# Patient Record
Sex: Female | Born: 1955 | Race: Black or African American | Hispanic: No | Marital: Married | Smoking: Never smoker
Health system: Southern US, Community
[De-identification: ages and names within clinical notes are randomized; demographics above are authoritative.]

## PROBLEM LIST (undated history)

## (undated) DIAGNOSIS — I422 Other hypertrophic cardiomyopathy: Secondary | ICD-10-CM

## (undated) DIAGNOSIS — I10 Essential (primary) hypertension: Secondary | ICD-10-CM

## (undated) DIAGNOSIS — K219 Gastro-esophageal reflux disease without esophagitis: Secondary | ICD-10-CM

## (undated) HISTORY — DX: Gastro-esophageal reflux disease without esophagitis: K21.9

## (undated) HISTORY — DX: Essential (primary) hypertension: I10

## (undated) HISTORY — PX: ABDOMINAL HYSTERECTOMY: SHX81

## (undated) HISTORY — DX: Other hypertrophic cardiomyopathy: I42.2

---

## 2002-09-18 ENCOUNTER — Emergency Department (HOSPITAL_COMMUNITY): Admission: EM | Admit: 2002-09-18 | Discharge: 2002-09-18 | Payer: Self-pay | Admitting: *Deleted

## 2010-05-21 ENCOUNTER — Inpatient Hospital Stay (HOSPITAL_COMMUNITY): Admission: EM | Admit: 2010-05-21 | Discharge: 2010-05-24 | Payer: Self-pay | Source: Home / Self Care

## 2010-05-22 ENCOUNTER — Encounter (INDEPENDENT_AMBULATORY_CARE_PROVIDER_SITE_OTHER): Payer: Self-pay | Admitting: Internal Medicine

## 2010-08-29 LAB — CBC
HCT: 37.1 % (ref 36.0–46.0)
Hemoglobin: 13 g/dL (ref 12.0–15.0)
MCH: 29.7 pg (ref 26.0–34.0)
MCH: 30 pg (ref 26.0–34.0)
MCHC: 34.8 g/dL (ref 30.0–36.0)
MCHC: 35.4 g/dL (ref 30.0–36.0)
MCV: 85.7 fL (ref 78.0–100.0)
Platelets: 122 10*3/uL — ABNORMAL LOW (ref 150–400)
Platelets: 123 10*3/uL — ABNORMAL LOW (ref 150–400)
RBC: 4.26 MIL/uL (ref 3.87–5.11)
RBC: 4.37 MIL/uL (ref 3.87–5.11)
RBC: 4.67 MIL/uL (ref 3.87–5.11)
RDW: 13.1 % (ref 11.5–15.5)
RDW: 13.5 % (ref 11.5–15.5)
WBC: 4.1 10*3/uL (ref 4.0–10.5)
WBC: 4.7 10*3/uL (ref 4.0–10.5)

## 2010-08-29 LAB — HEPATITIS PANEL, ACUTE
Hep A IgM: NEGATIVE
Hepatitis B Surface Ag: NEGATIVE

## 2010-08-29 LAB — CK TOTAL AND CKMB (NOT AT ARMC)
CK, MB: 3.2 ng/mL (ref 0.3–4.0)
Total CK: 179 U/L — ABNORMAL HIGH (ref 7–177)

## 2010-08-29 LAB — BASIC METABOLIC PANEL
BUN: 15 mg/dL (ref 6–23)
CO2: 27 mEq/L (ref 19–32)
CO2: 29 mEq/L (ref 19–32)
CO2: 29 mEq/L (ref 19–32)
Calcium: 9.4 mg/dL (ref 8.4–10.5)
Calcium: 9.4 mg/dL (ref 8.4–10.5)
Calcium: 9.9 mg/dL (ref 8.4–10.5)
Chloride: 104 mEq/L (ref 96–112)
Creatinine, Ser: 1.16 mg/dL (ref 0.4–1.2)
Creatinine, Ser: 1.2 mg/dL (ref 0.4–1.2)
Creatinine, Ser: 1.46 mg/dL — ABNORMAL HIGH (ref 0.4–1.2)
GFR calc Af Amer: 45 mL/min — ABNORMAL LOW (ref >60)
GFR calc Af Amer: 56 mL/min — ABNORMAL LOW (ref >60)
GFR calc non Af Amer: 46 mL/min — ABNORMAL LOW (ref >60)
GFR calc non Af Amer: 48 mL/min — ABNORMAL LOW (ref >60)
Glucose, Bld: 95 mg/dL (ref 70–99)
Glucose, Bld: 99 mg/dL (ref 70–99)
Potassium: 3.6 mEq/L (ref 3.5–5.1)
Sodium: 139 mEq/L (ref 135–145)
Sodium: 139 mEq/L (ref 135–145)

## 2010-08-29 LAB — COMPREHENSIVE METABOLIC PANEL
ALT: 548 U/L — ABNORMAL HIGH (ref 0–35)
Albumin: 3.7 g/dL (ref 3.5–5.2)
BUN: 13 mg/dL (ref 6–23)
Calcium: 9.6 mg/dL (ref 8.4–10.5)
GFR calc Af Amer: 43 mL/min — ABNORMAL LOW (ref >60)
Glucose, Bld: 104 mg/dL — ABNORMAL HIGH (ref 70–99)
Sodium: 140 mEq/L (ref 135–145)
Total Bilirubin: 1.8 mg/dL — ABNORMAL HIGH (ref 0.3–1.2)
Total Protein: 7 g/dL (ref 6.0–8.3)

## 2010-08-29 LAB — POCT CARDIAC MARKERS: CKMB, poc: 1.7 ng/mL (ref 1.0–8.0)

## 2010-08-29 LAB — URINE MICROSCOPIC-ADD ON

## 2010-08-29 LAB — URINALYSIS, ROUTINE W REFLEX MICROSCOPIC
Glucose, UA: NEGATIVE mg/dL
Urobilinogen, UA: 0.2 mg/dL (ref 0.0–1.0)
pH: 7.5 (ref 5.0–8.0)

## 2010-08-29 LAB — HEPATIC FUNCTION PANEL
ALT: 358 U/L — ABNORMAL HIGH (ref 0–35)
AST: 137 U/L — ABNORMAL HIGH (ref 0–37)
Albumin: 3.7 g/dL (ref 3.5–5.2)
Alkaline Phosphatase: 100 U/L (ref 39–117)
Indirect Bilirubin: 0.7 mg/dL (ref 0.3–0.9)
Total Protein: 7.1 g/dL (ref 6.0–8.3)

## 2010-08-29 LAB — RAPID URINE DRUG SCREEN, HOSP PERFORMED
Amphetamines: NOT DETECTED
Barbiturates: NOT DETECTED
Benzodiazepines: NOT DETECTED
Cocaine: NOT DETECTED
Opiates: NOT DETECTED
Tetrahydrocannabinol: NOT DETECTED

## 2010-08-29 LAB — CARDIAC PANEL(CRET KIN+CKTOT+MB+TROPI)
CK, MB: 2.7 ng/mL (ref 0.3–4.0)
Total CK: 137 U/L (ref 7–177)
Troponin I: 0.02 ng/mL (ref 0.00–0.06)
Troponin I: 0.04 ng/mL (ref 0.00–0.06)

## 2010-08-29 LAB — DIFFERENTIAL
Basophils Absolute: 0 10*3/uL (ref 0.0–0.1)
Basophils Absolute: 0 10*3/uL (ref 0.0–0.1)
Basophils Relative: 0 % (ref 0–1)
Eosinophils Absolute: 0.1 10*3/uL (ref 0.0–0.7)
Eosinophils Relative: 3 % (ref 0–5)
Lymphocytes Relative: 32 % (ref 12–46)
Lymphs Abs: 1.1 10*3/uL (ref 0.7–4.0)
Lymphs Abs: 1.5 10*3/uL (ref 0.7–4.0)
Monocytes Relative: 12 % (ref 3–12)
Neutro Abs: 2.6 10*3/uL (ref 1.7–7.7)
Neutrophils Relative %: 58 % (ref 43–77)
Neutrophils Relative %: 59 % (ref 43–77)

## 2010-08-29 LAB — LIPID PANEL
Cholesterol: 141 mg/dL (ref 0–200)
LDL Cholesterol: 88 mg/dL (ref 0–99)

## 2010-08-29 LAB — URINE CULTURE: Colony Count: NO GROWTH

## 2011-09-05 ENCOUNTER — Encounter: Payer: Self-pay | Admitting: Cardiology

## 2011-09-11 ENCOUNTER — Encounter: Payer: Self-pay | Admitting: Cardiology

## 2011-09-12 ENCOUNTER — Ambulatory Visit (INDEPENDENT_AMBULATORY_CARE_PROVIDER_SITE_OTHER): Payer: Self-pay | Admitting: Physician Assistant

## 2011-09-12 ENCOUNTER — Encounter: Payer: Self-pay | Admitting: Cardiology

## 2011-09-12 DIAGNOSIS — R079 Chest pain, unspecified: Secondary | ICD-10-CM | POA: Insufficient documentation

## 2011-09-12 DIAGNOSIS — I10 Essential (primary) hypertension: Secondary | ICD-10-CM

## 2011-09-12 DIAGNOSIS — I422 Other hypertrophic cardiomyopathy: Secondary | ICD-10-CM

## 2011-09-12 MED ORDER — AMLODIPINE BESYLATE 5 MG PO TABS: 5.0000 mg | ORAL_TABLET | Freq: Every day | ORAL | Status: DC

## 2011-09-12 MED ORDER — LISINOPRIL 20 MG PO TABS: 20.0000 mg | ORAL_TABLET | Freq: Every day | ORAL | Status: DC

## 2011-09-12 NOTE — Progress Notes (Signed)
HPI:  This is a 56 year old African American female patient who is sent to [place] [facility] for evaluation of chest pain and uncontrolled hypertension. The patient has a history of a hyper-tensive emergency back in 2011 at which time she was admitted to the hospital. 2-D echo at that time showed disproportionate septal hypertrophy, no left ventricular outflow tract gradient, only oral SAM. 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  The patient has not seen a cardiologist in followup. She complains of two-month history of sharp shooting chest pains that occur once a week for the past 2 months. She says they go away within 3 seconds but she takes a baby aspirin for it. She denies any chest tightness, dyspnea, dyspnea on exertion, radiation of pain, dizziness, or presyncope. 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 She was just started on lisinopril.  She walks 45 minutes a day without difficulty. She is not a diabetic, has normal lipids, and does not smoke.  Allergies  Allergen Reactions  . Codeine     Current Outpatient Prescriptions on File Prior to Visit  Medication Sig Dispense Refill  . lisinopril (PRINIVIL,ZESTRIL) 10 MG tablet Take 10 mg by mouth daily.      . metoprolol (LOPRESSOR) 50 MG tablet Take 50 mg by mouth 2 (two) times daily.         Past Medical History  Diagnosis Date  . Essential hypertension, benign   . Hypertrophic non-obstructive cardiomyopathy     Echocardiogram 2011    No past surgical history on file.  Family History  Problem Relation Age of Onset  . Diabetes type II Mother   . Diabetes type II Sister   . Hypertension Mother   . Hypertension Sister     History   Social History  . Marital Status: Single    Spouse Name: N/A    Number of Children: N/A  . Years of Education: N/A   Occupational History  . Not on file.   Social History Main Topics  . Smoking status: Never Smoker   .  Smokeless tobacco: Never Used  . Alcohol Use: No  . Drug Use: No  . Sexually Active: Not on file   Other Topics Concern  . Not on file   Social History Narrative  . No narrative on file    ROS:see history of present illness otherwise negative   PHYSICAL EXAM: Well-nournished, in no acute distress. Neck: No JVD, HJR, Bruit, or thyroid enlargement Lungs: No tachypnea, clear without wheezing, rales, or rhonchi Cardiovascular: RRR, PMI not displaced, 2-3/6 systolic murmur at the left sternal border, no gallops, bruit, thrill, or heave. Abdomen: BS normal. Soft without organomegaly, masses, lesions or tenderness. Extremities: without cyanosis, clubbing or edema. Good distal pulses bilateral SKin: Warm, no lesions or rashes  Musculoskeletal: No deformities Neuro: no focal signs  BP 220/93  Pulse 62  Resp 16  Ht 5\' 7"  (1.702 m)  Wt 242 lb (109.77 kg)  BMI 37.90 kg/m2   NWG:NFAOZHY from 09/05/11 shows normal sinus rhythm first degree AV block and LVH with nonspecific ST-T wave changes

## 2011-09-12 NOTE — Progress Notes (Signed)
Patient seen and examined. Reviewed available records and discussed with [name]. [name] has a history of a nonobstructive hypertrophic cardiomyopathy as well as difficult to control hypertension. Last echocardiogram was in 2011. From a symptom perspective she reports atypical, sharp, brief episodes of chest pain, not specifically exertional. She has not been on any consistent medical regimen for blood pressure control, now followed [facility] the [facility]. ECG shows significant LVH with repolarization abnormalities.  On examination blood pressure is elevated at 220/93, heart rate in the 60s. She has a 2/6 systolic murmur heard at the base, no major change with maneuvers, no gallop. Lungs are clear and there is no peripheral edema.  Our plan at this point is to followup with a repeat 2-D echocardiogram. Medical therapy will be modified to include an increase in lisinopril and the addition of Norvasc. We will bring her back for followup.  Jonelle Sidle, M.D., F.A.C.C.

## 2011-09-12 NOTE — Patient Instructions (Addendum)
Your physician recommends that you schedule a follow-up appointment in: 1 month  Your physician has requested that you have an echocardiogram. Echocardiography is a painless test that uses sound waves to create images of your heart. It provides your doctor with information about the size and shape of your heart and how well your heart's chambers and valves are working. This procedure takes approximately one hour. There are no restrictions for this procedure.  Your physician has recommended you make the following change in your medication:  1 - START Amlodipine 5 mg daily 2 - INCREASE Lisinopril to 20 mg daily  Low Salt Diet (Information included)

## 2011-09-12 NOTE — Assessment & Plan Note (Addendum)
Patient has significant hypertension. We will increase her lisinopril to 20 mg daily, and Norvasc 5 mg daily, 2 g sodium diet. Followup here in one month.Regular followup at the [facility] for her hypertension

## 2011-09-12 NOTE — Assessment & Plan Note (Signed)
Patient has history of nonobstructive hypertrophic cardiomyopathy on echo in 2011. She has not been evaluated for this since. We will followup 2-D echo.

## 2011-09-12 NOTE — Assessment & Plan Note (Signed)
Patient has atypical chest pain described as sharp shooting it lasts less than 3 seconds.

## 2011-09-21 ENCOUNTER — Ambulatory Visit (HOSPITAL_COMMUNITY)
Admission: RE | Admit: 2011-09-21 | Discharge: 2011-09-21 | Disposition: A | Discharge status: Home / Self Care | Payer: Self-pay | Source: Ambulatory Visit | Attending: Cardiology | Admitting: Cardiology

## 2011-09-21 DIAGNOSIS — I422 Other hypertrophic cardiomyopathy: Secondary | ICD-10-CM

## 2011-09-21 DIAGNOSIS — I359 Nonrheumatic aortic valve disorder, unspecified: Secondary | ICD-10-CM

## 2011-09-21 DIAGNOSIS — R079 Chest pain, unspecified: Secondary | ICD-10-CM | POA: Insufficient documentation

## 2011-09-21 DIAGNOSIS — I10 Essential (primary) hypertension: Secondary | ICD-10-CM | POA: Insufficient documentation

## 2011-09-21 NOTE — Progress Notes (Signed)
*  PRELIMINARY RESULTS* Echocardiogram 2D Echocardiogram has been performed.  Traci Chandler 09/21/2011, 12:53 PM

## 2011-10-12 ENCOUNTER — Ambulatory Visit (INDEPENDENT_AMBULATORY_CARE_PROVIDER_SITE_OTHER): Payer: Self-pay | Admitting: Cardiology

## 2011-10-12 ENCOUNTER — Encounter: Payer: Self-pay | Admitting: Cardiology

## 2011-10-12 VITALS — BP 186/88 | HR 61 | Resp 16 | Ht 67.0 in | Wt 237.0 lb

## 2011-10-12 DIAGNOSIS — I422 Other hypertrophic cardiomyopathy: Secondary | ICD-10-CM

## 2011-10-12 DIAGNOSIS — I10 Essential (primary) hypertension: Secondary | ICD-10-CM

## 2011-10-12 MED ORDER — HYDRALAZINE HCL 25 MG PO TABS: 25.0000 mg | ORAL_TABLET | Freq: Three times a day (TID) | ORAL | Status: DC

## 2011-10-12 NOTE — Progress Notes (Signed)
   Clinical Summary Traci Chandler is a 56 y.o.female presenting for followup. She was seen in March with Ms. Geni Bers.  Followup echocardiogram showed LVEF 60-65% with severe LVH but no outflow gradient. grade 2 diastolic dysfunction. Her medications were adjusted at the last visit.  She did not tolerate Norvasc, complained of bloated feeling. Lopressor was subsequently increased. She has been doing some walking for exercise. No progressive dyspnea. No syncope.  Blood pressure better but remains high.   Allergies  Allergen Reactions  . Codeine     Current Outpatient Prescriptions  Medication Sig Dispense Refill  . aspirin 81 MG tablet Take 81 mg by mouth daily.      Marland Kitchen lisinopril (PRINIVIL,ZESTRIL) 20 MG tablet Take 1 tablet (20 mg total) by mouth daily.  30 tablet  12  . metoprolol (LOPRESSOR) 100 MG tablet Take 100 mg by mouth 2 (two) times daily.      Marland Kitchen DISCONTD: metoprolol (LOPRESSOR) 50 MG tablet Take 50 mg by mouth 2 (two) times daily.         Past Medical History  Diagnosis Date  . Essential hypertension, benign   . Hypertrophic non-obstructive cardiomyopathy     Echocardiogram 2011    Social History [name] reports that she has never smoked. She has never used smokeless tobacco. Traci Chandler reports that she does not drink alcohol.  Review of Systems No palpitations or syncope. Increased pigmentation lower legs. Otherwise negative.  Physical Examination Filed Vitals:   10/12/11 1335  BP: 186/88  Pulse: 61  Resp: 16   Overweight woman in NAD. HEENT: Conjunctiva and lids normal, oropharynx clear. Neck: Supple, no elevated JVP or carotid bruits, no thyromegaly. Lungs: Clear to auscultation, nonlabored breathing at rest. Cardiac: Regular rate and rhythm, no S3, 2/6 systolic murmur at base - does not change significantly with standing, no pericardial rub.. Abdomen: Soft, nontender, bowel sounds present, no guarding or rebound. Extremities: No pitting edema,  distal pulses 2+. Venous stasis and hyperpigmentation. Skin: Warm and dry. Musculoskeletal: No kyphosis. Neuropsychiatric: Alert and oriented x3, affect grossly appropriate.     Problem List and Plan

## 2011-10-12 NOTE — Assessment & Plan Note (Signed)
Nonobstructive by recent echocardiogram. Agree with increase in beta blocker. Should be OK to exercise, mild to moderate - mainly walking or elliptical machine. Followup arranged.

## 2011-10-12 NOTE — Patient Instructions (Signed)
**Note De-Identified Joelynn Dust Obfuscation** Your physician has recommended you make the following change in your medication: start taking Hydralazine 25 mg three times daily  Your physician recommends that you schedule a follow-up appointment in: 1 month

## 2011-10-12 NOTE — Assessment & Plan Note (Signed)
Continue present regimen and add Hydralazine 25 mg TID.

## 2011-10-17 ENCOUNTER — Other Ambulatory Visit (HOSPITAL_COMMUNITY): Payer: Self-pay | Admitting: Physician Assistant

## 2011-10-17 DIAGNOSIS — Z1231 Encounter for screening mammogram for malignant neoplasm of breast: Secondary | ICD-10-CM

## 2011-10-23 ENCOUNTER — Ambulatory Visit (HOSPITAL_COMMUNITY)
Admission: RE | Admit: 2011-10-23 | Discharge: 2011-10-23 | Disposition: A | Discharge status: Home / Self Care | Payer: Self-pay | Source: Ambulatory Visit | Attending: Physician Assistant | Admitting: Physician Assistant

## 2011-10-23 DIAGNOSIS — Z1231 Encounter for screening mammogram for malignant neoplasm of breast: Secondary | ICD-10-CM

## 2011-11-11 IMAGING — CR DG CHEST 1V PORT
1 series · 1 of 1 positions shown · non-contrast
Comparison: None

CLINICAL DATA: Chest pain and hypertension.

PORTABLE CHEST - 1 VIEW

[view not recorded]
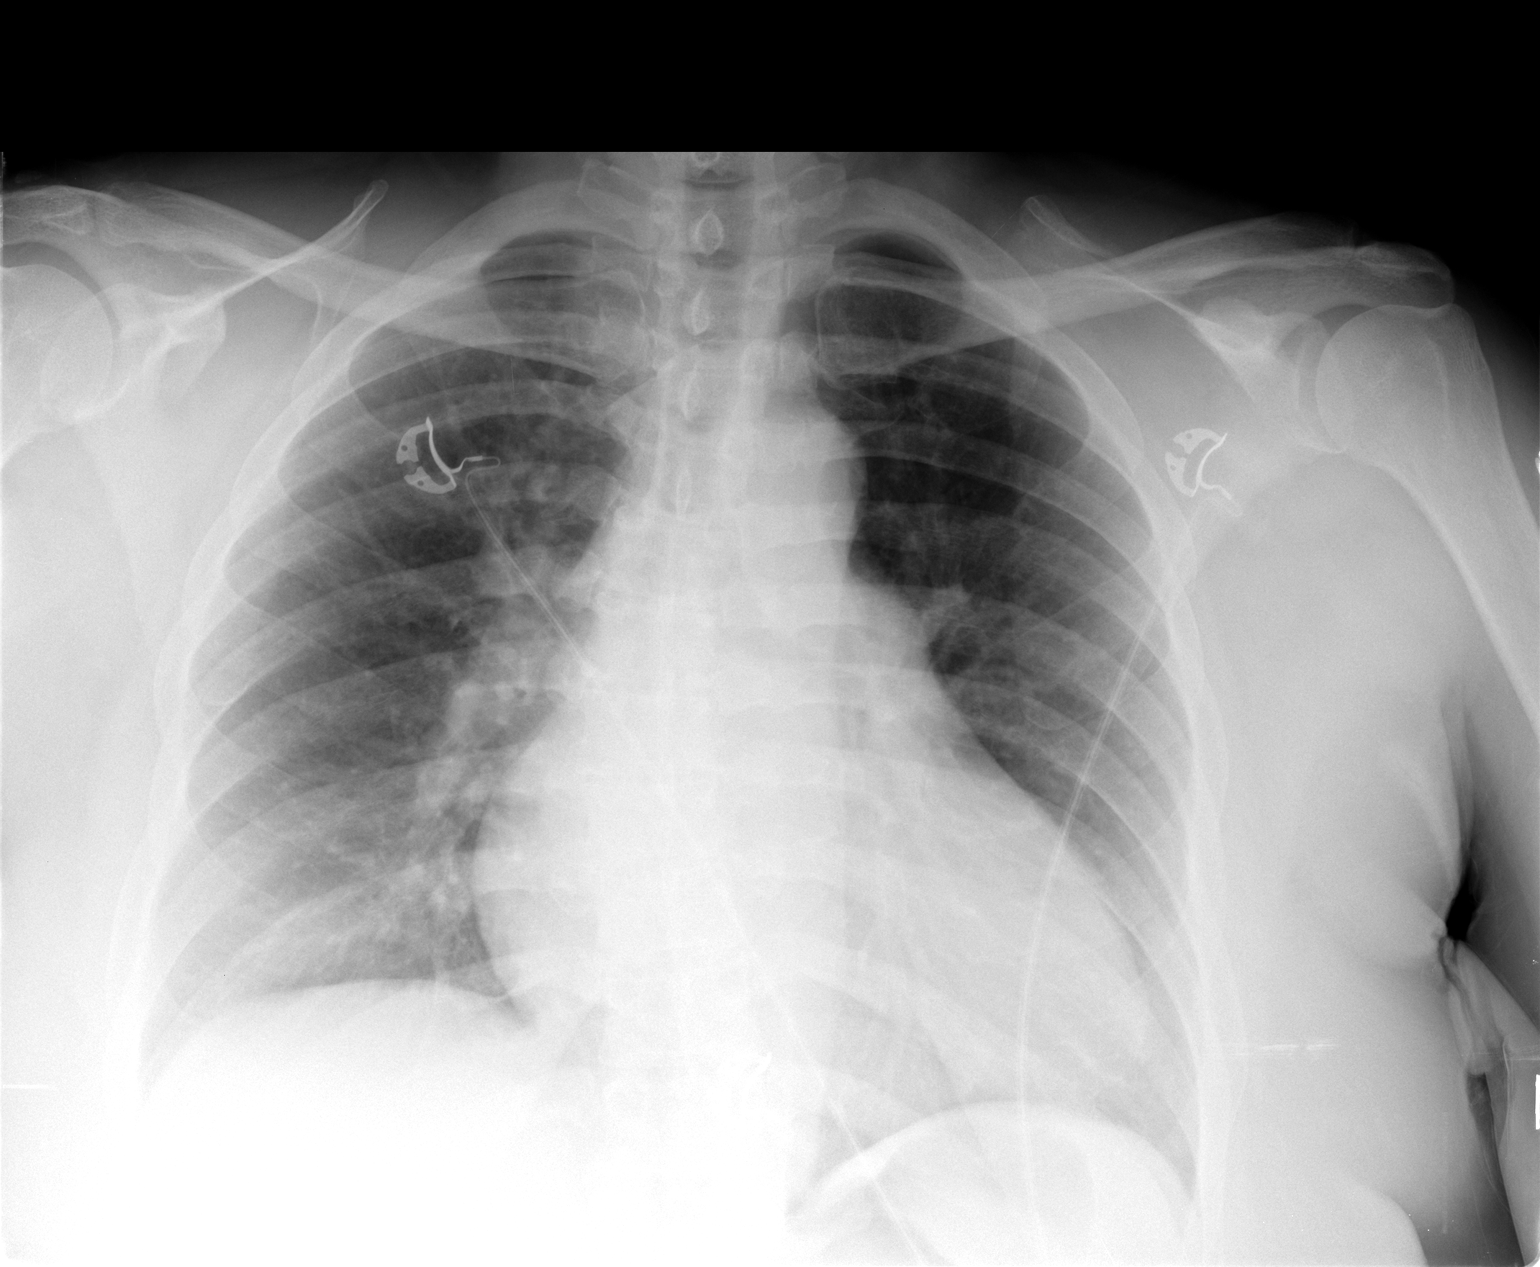

[1 of 1 positions shown; findings below may reference images not displayed]

FINDINGS: The heart is enlarged.  The mediastinal and hilar
contours are within normal limits.  The lungs are clear.  The bony
thorax is intact.
IMPRESSION: 1.  Mild cardiac enlargement.
2.  No acute pulmonary findings.

## 2011-11-13 ENCOUNTER — Ambulatory Visit (INDEPENDENT_AMBULATORY_CARE_PROVIDER_SITE_OTHER): Payer: Self-pay | Admitting: Cardiology

## 2011-11-13 ENCOUNTER — Encounter: Payer: Self-pay | Admitting: Cardiology

## 2011-11-13 VITALS — BP 222/102 | HR 58 | Ht 67.0 in | Wt 236.0 lb

## 2011-11-13 DIAGNOSIS — I422 Other hypertrophic cardiomyopathy: Secondary | ICD-10-CM

## 2011-11-13 DIAGNOSIS — I10 Essential (primary) hypertension: Secondary | ICD-10-CM

## 2011-11-13 IMAGING — US US ABDOMEN COMPLETE
1 series · 13 of 25 positions shown · non-contrast
Comparison: None

***ADDENDUM*** CREATED: 05/24/2010 [DATE]

Repeat imaging of the gallbladder performed in a with the patient
n.p.o.  Normal gallbladder is not identified.  At expected
position of the gallbladder fossa, a shadowing structures
identified highly suspicious for a contracted gallbladder
containing multiple shadowing calculi, creating a "wall echo shadow
complex."  No definite free fluid identified right upper quadrant.
Patient was not tender with transducer pressure.
CLINICAL DATA: Abdominal pain.
COMPLETE ABDOMINAL ULTRASOUND

[Series 1: us abdomen complete · 0.26mm/px · 13 of 58 slices shown]
[im 1/58]
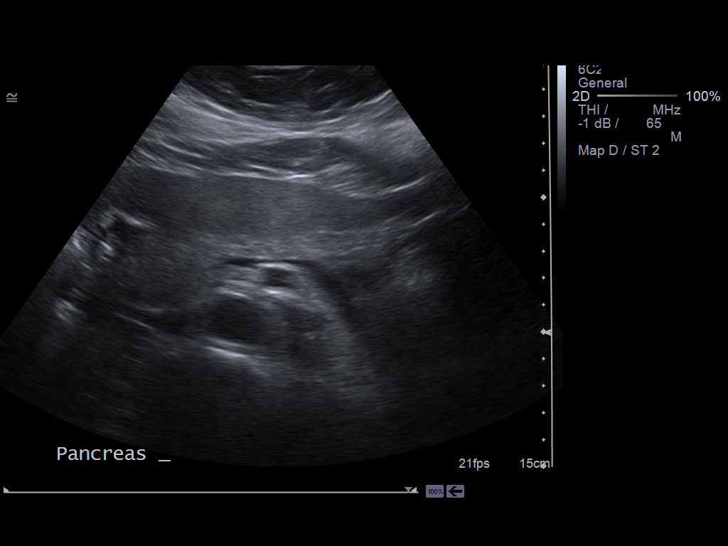
[im 5/58]
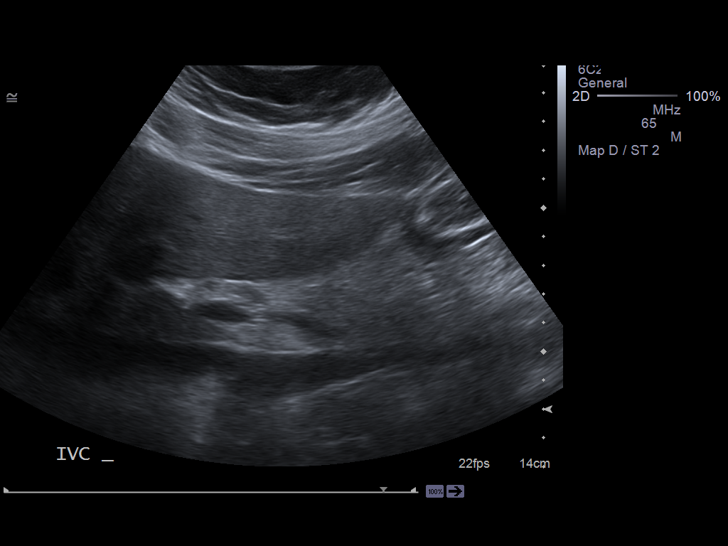
[im 10/58]
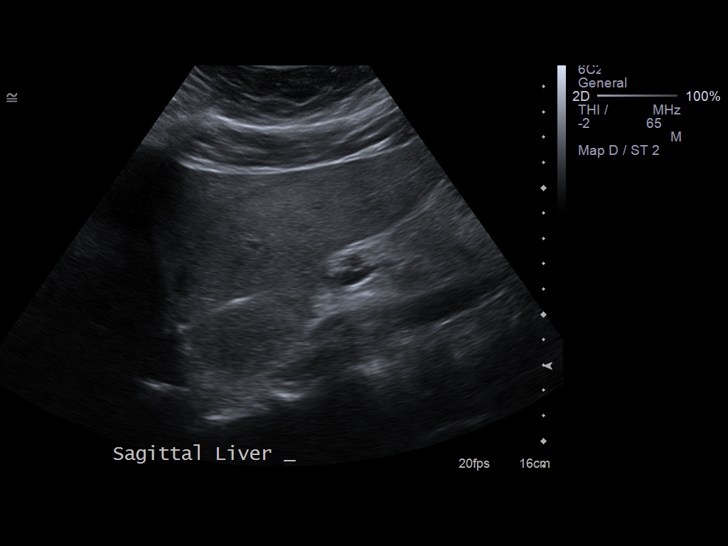
[im 15/58]
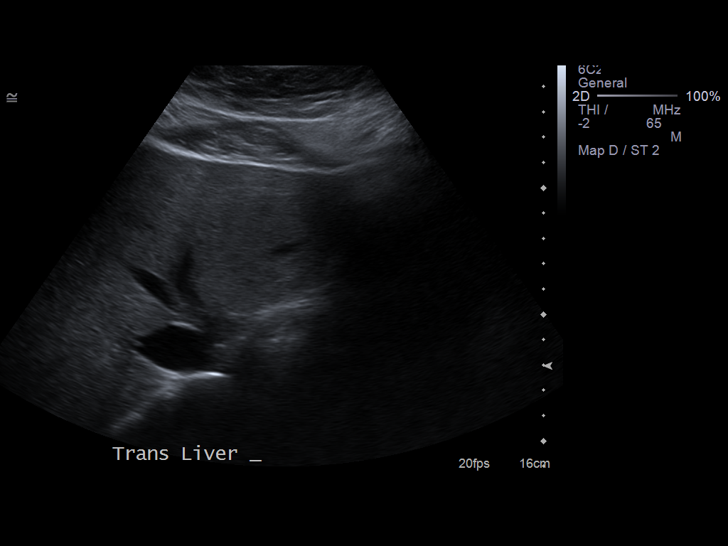
[im 20/58]
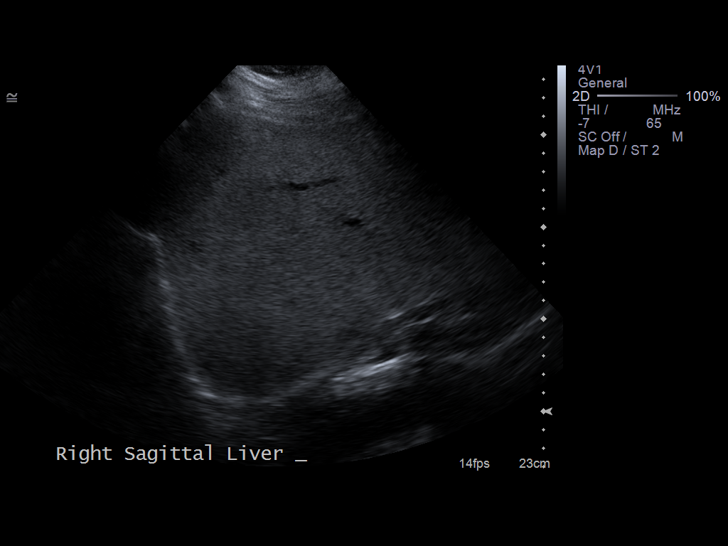
[im 24/58]
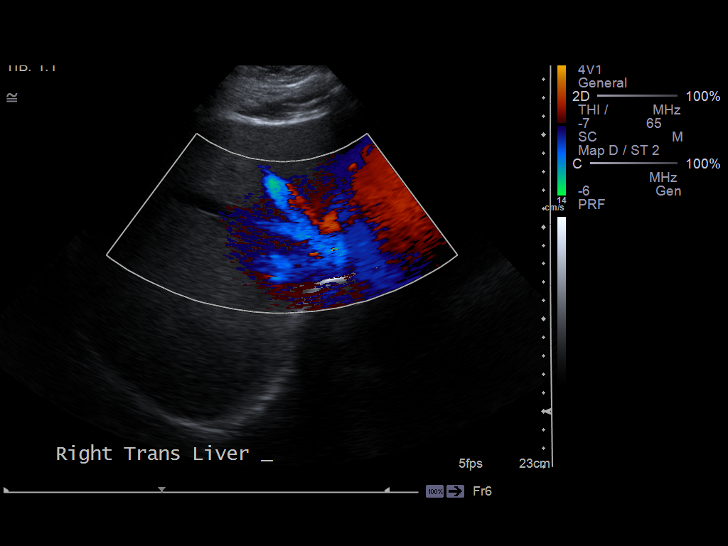
[im 29/58]
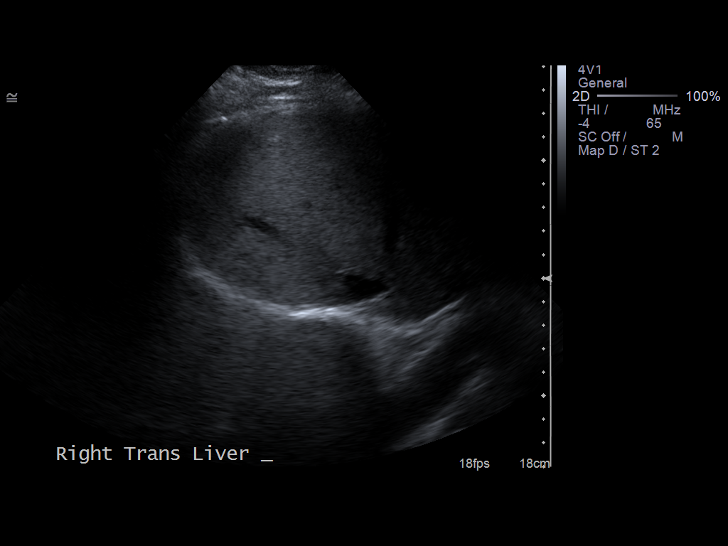
[im 34/58]
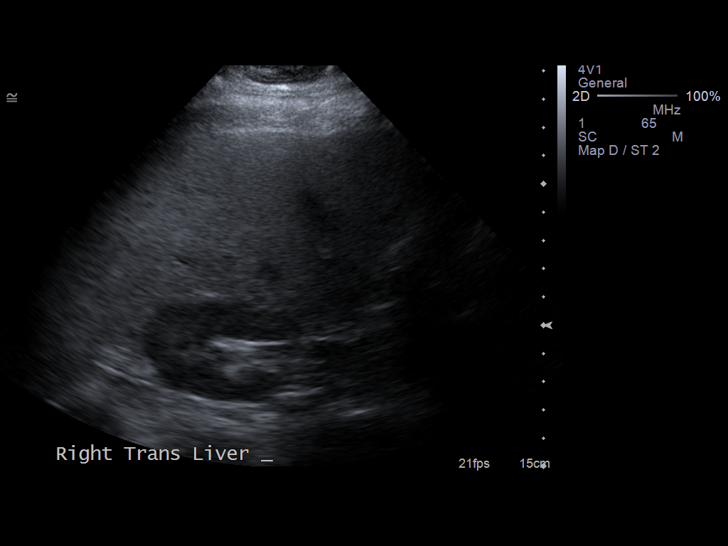
[im 39/58]
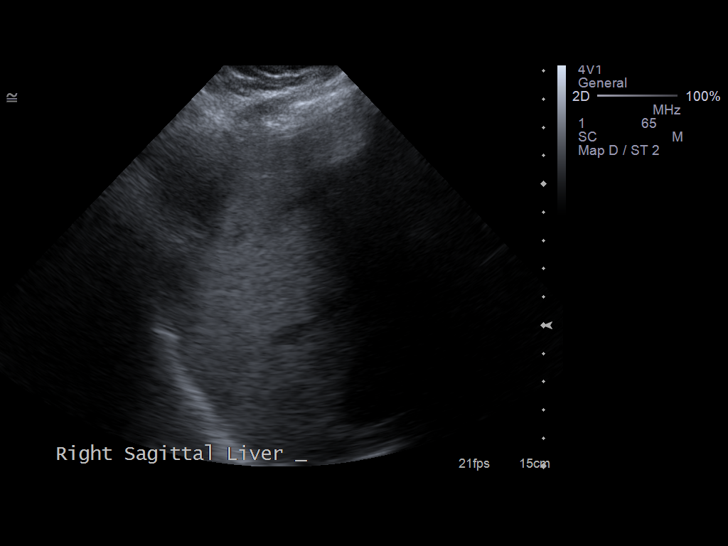
[im 43/58]
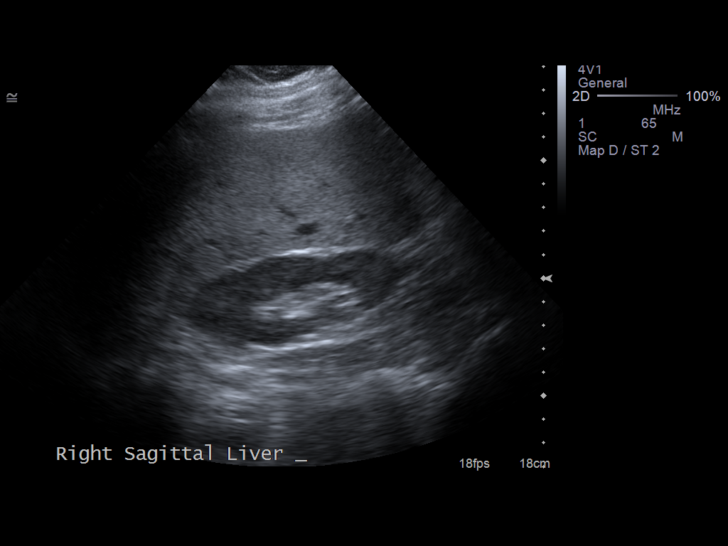
[im 48/58]
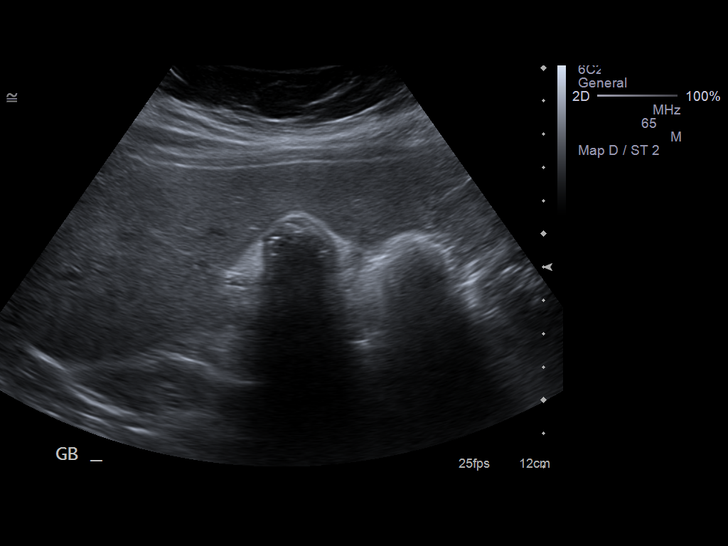
[im 53/58]
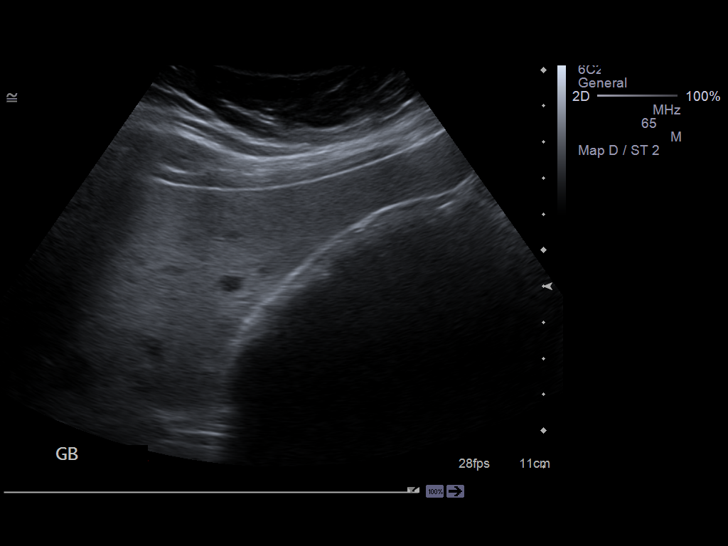
[im 58/58]
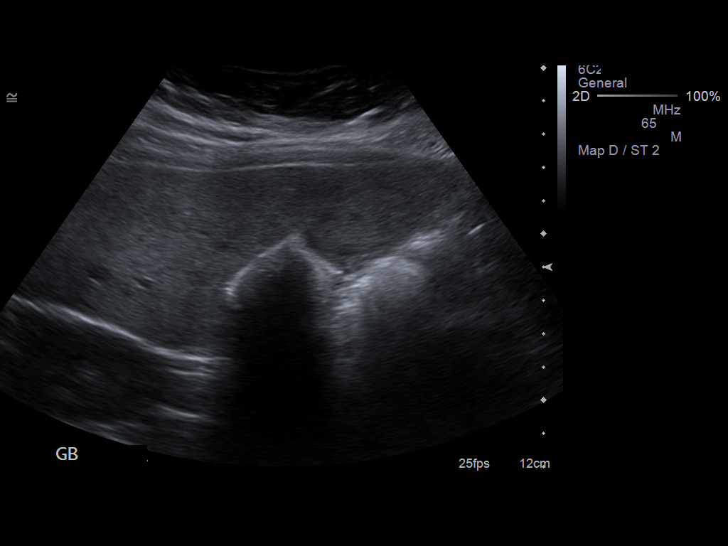

[13 of 25 positions shown; findings below may reference images not displayed]

FINDINGS: Gallbladder:  The gallbladder is not visualized for certain.
However, the patient was not n.p.o.  There appears to be a wall
echo shadow complex in the region of the gallbladder fossa
suspicious for a contracted stone filled gallbladder.  A repeat
examination in the morning is suggested.

Common bile duct:  Slightly dilated at 6.8 mm.

Liver:  Mild increased echogenicity may suggest fatty infiltration.
No focal lesions or biliary dilatation.

IVC:  Normal caliber.

Pancreas:  Sonographically unremarkable.

Spleen:  Normal size and echogenicity without focal lesions.

Right Kidney:  12.2 cm in length. Normal renal cortical thickness
and echogenicity without focal lesions or hydronephrosis.

Left Kidney:  11.5 cm in length. Normal renal cortical thickness
and echogenicity without focal lesions or hydronephrosis.

Abdominal aorta:  Normal caliber.
IMPRESSION: 1.  Probable contracted stone filled gallbladder.  Recommend repeat
examination in the morning as the patient was not n.p.o.
2.  Slightly dilated common bile duct.
3.  Normal sonographic appearance of the liver, spleen, pancreas
and both kidneys.

## 2011-11-13 MED ORDER — LISINOPRIL 40 MG PO TABS: 40.0000 mg | ORAL_TABLET | Freq: Every day | ORAL | Status: DC

## 2011-11-13 MED ORDER — HYDRALAZINE HCL 50 MG PO TABS: 50.0000 mg | ORAL_TABLET | Freq: Three times a day (TID) | ORAL | Status: DC

## 2011-11-13 NOTE — Assessment & Plan Note (Signed)
Blood pressure has increased once again. Continue current dose of atenolol that was recently started on 5/17. Hydralazine will be advanced to 50 mg 3 times a day, and lisinopril advanced to 40 mg daily. Followup BMET in 2 weeks. Office visit arranged.

## 2011-11-13 NOTE — Patient Instructions (Signed)
Your physician recommends that you schedule a follow-up appointment in: 1 month  Your physician has recommended you make the following change in your medication:  1 - INCREASE Hydralazine to 50 mg three times daily 2 - INCREASE Lisinopril to 40 mg daily  Your physician recommends that you return for lab work in: 2 week

## 2011-11-13 NOTE — Progress Notes (Signed)
   Clinical Summary Traci Chandler is a 56 y.o.female presenting for followup. She was seen in April. At that time blood pressure trend was better although not optimal. She was being treated with lisinopril and Lopressor. It seems that her Lopressor was stopped secondary to reported nightmares, and she is now on atenolol. She is also on hydralazine which was added at the last visit.  She states that her nightmares have resolved. She reports no chest pain, indicates blood pressures have once again been elevated by her home checks. She otherwise reports compliance with her remaining medications.   Allergies  Allergen Reactions  . Codeine     Current Outpatient Prescriptions  Medication Sig Dispense Refill  . aspirin 81 MG tablet Take 81 mg by mouth daily.      Marland Kitchen atenolol (TENORMIN) 100 MG tablet Take 100 mg by mouth daily.      . hydrALAZINE (APRESOLINE) 50 MG tablet Take 1 tablet (50 mg total) by mouth 3 (three) times daily.  90 tablet  12  . lisinopril (PRINIVIL,ZESTRIL) 40 MG tablet Take 1 tablet (40 mg total) by mouth daily.  30 tablet  12  . DISCONTD: hydrALAZINE (APRESOLINE) 25 MG tablet Take 1 tablet (25 mg total) by mouth 3 (three) times daily.  90 tablet  3  . DISCONTD: lisinopril (PRINIVIL,ZESTRIL) 20 MG tablet Take 1 tablet (20 mg total) by mouth daily.  30 tablet  12    Past Medical History  Diagnosis Date  . Essential hypertension, benign   . Hypertrophic non-obstructive cardiomyopathy     Echocardiogram 2011    Social History Traci Chandler reports that she has never smoked. She has never used smokeless tobacco. Traci Chandler reports that she does not drink alcohol.  Review of Systems No palpitations or syncope. No headaches or focal motor weakness. Otherwise negative.  Physical Examination Filed Vitals:   11/13/11 1421  BP: 222/102  Pulse: 58   Overweight woman in NAD.  HEENT: Conjunctiva and lids normal, oropharynx clear.  Neck: Supple, no elevated JVP or carotid  bruits, no thyromegaly.  Lungs: Clear to auscultation, nonlabored breathing at rest.  Cardiac: Regular rate and rhythm, no S3, 2/6 systolic murmur at base - does not change significantly with standing, no pericardial rub..  Abdomen: Soft, nontender, bowel sounds present, no guarding or rebound.  Extremities: No pitting edema, distal pulses 2+. Venous stasis and hyperpigmentation.    Problem List and Plan   Essential hypertension, benign Blood pressure has increased once again. Continue current dose of atenolol that was recently started on 5/17. Hydralazine will be advanced to 50 mg 3 times a day, and lisinopril advanced to 40 mg daily. Followup BMET in 2 weeks. Office visit arranged.  Hypertrophic cardiomyopathy Nonobstructive physiology.     Jonelle Sidle, M.D., F.A.C.C.

## 2011-11-13 NOTE — Assessment & Plan Note (Signed)
Nonobstructive physiology.

## 2011-12-19 ENCOUNTER — Ambulatory Visit (INDEPENDENT_AMBULATORY_CARE_PROVIDER_SITE_OTHER): Payer: Self-pay | Admitting: Cardiology

## 2011-12-19 ENCOUNTER — Encounter: Payer: Self-pay | Admitting: Cardiology

## 2011-12-19 VITALS — BP 170/78 | HR 63 | Ht 67.0 in | Wt 237.0 lb

## 2011-12-19 DIAGNOSIS — I422 Other hypertrophic cardiomyopathy: Secondary | ICD-10-CM

## 2011-12-19 DIAGNOSIS — R079 Chest pain, unspecified: Secondary | ICD-10-CM

## 2011-12-19 DIAGNOSIS — I10 Essential (primary) hypertension: Secondary | ICD-10-CM

## 2011-12-19 NOTE — Assessment & Plan Note (Signed)
Not well controlled, affected by noncompliance recently. Plan to continue current regimen, nurse blood pressure check in 2 weeks. Check a BMET at that time as well. We can determine if further titration is necessary.

## 2011-12-19 NOTE — Patient Instructions (Addendum)
**Note De-Identified Delma Drone Obfuscation** Your physician recommends that you continue on your current medications as directed. Please refer to the Current Medication list given to you today.  Your physician recommends that you return for lab work in: 2 weeks (same day as you Blood pressure check)  Your physician recommends that you schedule a follow-up appointment in: blood pressure check in 2 weeks and with provider in 1 month

## 2011-12-19 NOTE — Assessment & Plan Note (Signed)
Nonobstructive, no syncope.

## 2011-12-19 NOTE — Progress Notes (Signed)
   Clinical Summary [name] is a 56 y.o.female presenting for followup. She was seen in May. Medications were adjusted at that time. She has not taken them consistently, and did not follow up for a BMET. She states that she ran out of them, has not been taking anything since this past Friday.  Symptomatically, she reports no angina or progressive shortness of breath. She has had no palpitations or syncope.  Today we reinforced regular medication use, sodium restriction.   Allergies  Allergen Reactions  . Codeine     Current Outpatient Prescriptions  Medication Sig Dispense Refill  . aspirin 81 MG tablet Take 81 mg by mouth daily.      Marland Kitchen atenolol (TENORMIN) 100 MG tablet Take 100 mg by mouth daily.      . hydrALAZINE (APRESOLINE) 50 MG tablet Take 1 tablet (50 mg total) by mouth 3 (three) times daily.  90 tablet  12  . lisinopril (PRINIVIL,ZESTRIL) 40 MG tablet Take 1 tablet (40 mg total) by mouth daily.  30 tablet  12    Past Medical History  Diagnosis Date  . Essential hypertension, benign   . Hypertrophic non-obstructive cardiomyopathy     Echocardiogram 2011    Social History Traci Chandler reports that she has never smoked. She has never used smokeless tobacco. [name] reports that she does not drink alcohol.  Review of Systems Otherwise negative.  Physical Examination Filed Vitals:   12/19/11 1304  BP: 170/78  Pulse: 63   Overweight woman in NAD.  HEENT: Conjunctiva and lids normal, oropharynx clear.  Neck: Supple, no elevated JVP or carotid bruits, no thyromegaly.  Lungs: Clear to auscultation, nonlabored breathing at rest.  Cardiac: Regular rate and rhythm, no S3, 2/6 systolic murmur at base - does not change significantly with standing, no pericardial rub..  Abdomen: Soft, nontender, bowel sounds present, no guarding or rebound.  Extremities: No pitting edema, distal pulses 2+. Venous stasis and hyperpigmentation.     Problem List and Plan    Essential hypertension, benign Not well controlled, affected by noncompliance recently. Plan to continue current regimen, nurse blood pressure check in 2 weeks. Check a BMET at that time as well. We can determine if further titration is necessary.  Hypertrophic cardiomyopathy Nonobstructive, no syncope.    Jonelle Sidle, M.D., F.A.C.C.

## 2012-01-03 ENCOUNTER — Encounter: Payer: Self-pay | Admitting: *Deleted

## 2012-01-03 ENCOUNTER — Telehealth: Payer: Self-pay | Admitting: *Deleted

## 2012-01-03 ENCOUNTER — Ambulatory Visit (INDEPENDENT_AMBULATORY_CARE_PROVIDER_SITE_OTHER): Payer: Self-pay | Admitting: *Deleted

## 2012-01-03 VITALS — BP 198/85 | HR 63

## 2012-01-03 DIAGNOSIS — I10 Essential (primary) hypertension: Secondary | ICD-10-CM

## 2012-01-03 NOTE — Telephone Encounter (Signed)
Attempted to call both numbers listed, which are non working numbers.  Will send patient a certified letter.

## 2012-01-03 NOTE — Progress Notes (Signed)
Presents for blood pressure, as requested.  Is hypertensive at this visit, despite the fact that she states she is following her medication regimen.  No complaints noted.

## 2012-01-03 NOTE — Progress Notes (Signed)
Attempted to call both numbers, provided in patient's record.  Both are non working numbers.  Will send patient a certified letter.

## 2012-01-03 NOTE — Progress Notes (Signed)
Still did not see BMET results - please check on this. If she has truly been taking her medicines regularly, suggest increase hydralazine to 75 mg 3 times a day.  Jonelle Sidle, M.D., F.A.C.C.

## 2012-01-07 ENCOUNTER — Telehealth: Payer: Self-pay | Admitting: *Deleted

## 2012-01-07 NOTE — Telephone Encounter (Signed)
Certified letter receipt received [phone]

## 2012-01-11 ENCOUNTER — Other Ambulatory Visit: Payer: Self-pay | Admitting: Cardiology

## 2012-01-11 MED ORDER — ATENOLOL 100 MG PO TABS: 100.0000 mg | ORAL_TABLET | Freq: Every day | ORAL | Status: DC

## 2012-01-11 NOTE — Telephone Encounter (Signed)
Atenolol was prescribed by [facility].  They are closed today and she needs a refill.  Patient is asking if we can call it in for her because she only has 1 pill left. / tg

## 2012-01-24 ENCOUNTER — Ambulatory Visit: Payer: Self-pay | Admitting: Cardiology

## 2012-02-15 ENCOUNTER — Encounter: Payer: Self-pay | Admitting: *Deleted

## 2012-02-15 ENCOUNTER — Encounter: Payer: Self-pay | Admitting: Cardiology

## 2012-02-15 ENCOUNTER — Ambulatory Visit (INDEPENDENT_AMBULATORY_CARE_PROVIDER_SITE_OTHER): Payer: Self-pay | Admitting: Cardiology

## 2012-02-15 VITALS — BP 190/90 | HR 64 | Ht 67.0 in | Wt 237.0 lb

## 2012-02-15 DIAGNOSIS — I10 Essential (primary) hypertension: Secondary | ICD-10-CM

## 2012-02-15 MED ORDER — HYDRALAZINE HCL 100 MG PO TABS: 100.0000 mg | ORAL_TABLET | Freq: Three times a day (TID) | ORAL | Status: DC

## 2012-02-15 NOTE — Assessment & Plan Note (Signed)
Will try to see if she will take Hydralazine at 100 mg BID to TID. Need BMET. Could also add Aldactone, but would like to make sure that renal function is normal first. Continue other medications. Followup arranged.

## 2012-02-15 NOTE — Patient Instructions (Addendum)
Your physician recommends that you schedule a follow-up appointment in: 1 month  Your physician recommends that you return for lab work in: Please take script for lab work to [facility] prior to your appointment.  It is imperative that we check your kidney function.  Your physician has recommended you make the following change in your medication:  1 - INCREASE Hydralazine to 100 mg 3 times a day

## 2012-02-15 NOTE — Progress Notes (Signed)
   Clinical Summary [name] is a 56 y.o.female presenting for followup. She was seen in July. Blood pressure remains elevated. She reports no new symptoms, no chest pain or progressive dyspnea.  She states that she typically takes her Hydralazine twice a day, and other medications as directed.  We discussed options for treatment, need for BMET, and also compliance.   Allergies  Allergen Reactions  . Codeine     Current Outpatient Prescriptions  Medication Sig Dispense Refill  . aspirin 81 MG tablet Take 81 mg by mouth daily.      Marland Kitchen atenolol (TENORMIN) 100 MG tablet Take 1 tablet (100 mg total) by mouth daily.  30 tablet  6  . hydrALAZINE (APRESOLINE) 100 MG tablet Take 1 tablet (100 mg total) by mouth 3 (three) times daily.  90 tablet  11  . lisinopril (PRINIVIL,ZESTRIL) 40 MG tablet Take 1 tablet (40 mg total) by mouth daily.  30 tablet  12  . DISCONTD: hydrALAZINE (APRESOLINE) 50 MG tablet Take 1 tablet (50 mg total) by mouth 3 (three) times daily.  90 tablet  12    Past Medical History  Diagnosis Date  . Essential hypertension, benign   . Hypertrophic non-obstructive cardiomyopathy     Echocardiogram 2011    Social History Traci Chandler reports that she has never smoked. She has never used smokeless tobacco. [name] reports that she does not drink alcohol.  Review of Systems Negative except as outlined.  Physical Examination Filed Vitals:   02/15/12 1521  BP: 190/90  Pulse: 64    Overweight woman in NAD.  HEENT: Conjunctiva and lids normal, oropharynx clear.  Neck: Supple, no elevated JVP or carotid bruits, no thyromegaly.  Lungs: Clear to auscultation, nonlabored breathing at rest.  Cardiac: Regular rate and rhythm, no S3, 2/6 systolic murmur at base - does not change significantly with standing, no pericardial rub..  Abdomen: Soft, nontender, bowel sounds present, no guarding or rebound.  Extremities: No pitting edema, distal pulses 2+. Venous stasis  and hyperpigmentation.    Problem List and Plan   Essential hypertension, benign Will try to see if she will take Hydralazine at 100 mg BID to TID. Need BMET. Could also add Aldactone, but would like to make sure that renal function is normal first. Continue other medications. Followup arranged.    Jonelle Sidle, M.D., F.A.C.C.

## 2012-03-18 ENCOUNTER — Ambulatory Visit: Payer: Self-pay | Admitting: Cardiology

## 2012-03-21 ENCOUNTER — Ambulatory Visit (INDEPENDENT_AMBULATORY_CARE_PROVIDER_SITE_OTHER): Payer: Self-pay | Admitting: Cardiology

## 2012-03-21 ENCOUNTER — Encounter: Payer: Self-pay | Admitting: Cardiology

## 2012-03-21 ENCOUNTER — Encounter: Payer: Self-pay | Admitting: *Deleted

## 2012-03-21 VITALS — BP 210/110 | HR 78 | Wt 235.0 lb

## 2012-03-21 DIAGNOSIS — I10 Essential (primary) hypertension: Secondary | ICD-10-CM

## 2012-03-21 DIAGNOSIS — I422 Other hypertrophic cardiomyopathy: Secondary | ICD-10-CM

## 2012-03-21 MED ORDER — HYDRALAZINE HCL 25 MG PO TABS: 100.0000 mg | ORAL_TABLET | Freq: Three times a day (TID) | ORAL | Status: DC

## 2012-03-21 MED ORDER — SPIRONOLACTONE 25 MG PO TABS: 25.0000 mg | ORAL_TABLET | Freq: Every day | ORAL | Status: DC

## 2012-03-21 NOTE — Assessment & Plan Note (Signed)
We will add Aldactone to her current regimen. Also discussed exercise, sodium restriction, weight loss. Followup 6 weeks with BMET.

## 2012-03-21 NOTE — Assessment & Plan Note (Signed)
Clinically stable at this point. 

## 2012-03-21 NOTE — Patient Instructions (Addendum)
Your physician recommends that you schedule a follow-up appointment in: 6 weeks  Your physician has recommended you make the following change in your medication:  1 - START Aldactone (Spironolactone) 25 mg daily  Your physician recommends that you return for lab work in: Prior to your return appt

## 2012-03-21 NOTE — Progress Notes (Signed)
   Clinical Summary Traci Chandler is a 56 y.o.female presenting for followup. She was seen in August. She states that she is taking her medications, needs refills. Blood pressure is higher than usual today.   She did followup for labwork in late July. Sodium 140, potassium 4.1, BUN 22, creatinine 1.2, microalbumin 5.9. We reviewed the results and implications.  She has had no chest pain, palpitations, progressive dyspnea.   Allergies  Allergen Reactions  . Codeine     Current Outpatient Prescriptions  Medication Sig Dispense Refill  . aspirin 81 MG tablet Take 81 mg by mouth daily.      Marland Kitchen atenolol (TENORMIN) 100 MG tablet Take 1 tablet (100 mg total) by mouth daily.  30 tablet  6  . lisinopril (PRINIVIL,ZESTRIL) 40 MG tablet Take 1 tablet (40 mg total) by mouth daily.  30 tablet  12  . hydrALAZINE (APRESOLINE) 25 MG tablet Take 4 tablets (100 mg total) by mouth 3 (three) times daily.  270 tablet  3  . spironolactone (ALDACTONE) 25 MG tablet Take 1 tablet (25 mg total) by mouth daily.  90 tablet  3    Past Medical History  Diagnosis Date  . Essential hypertension, benign   . Hypertrophic non-obstructive cardiomyopathy     Echocardiogram 2011    Social History Traci Chandler reports that she has never smoked. She has never used smokeless tobacco. Traci Chandler reports that she does not drink alcohol.  Review of Systems Negative except as outlined.  Physical Examination Filed Vitals:   03/21/12 0838  BP: 210/110  Pulse: 78   Filed Weights   03/21/12 0838  Weight: 235 lb (106.595 kg)    HEENT: Conjunctiva and lids normal, oropharynx clear.  Neck: Supple, no elevated JVP or carotid bruits, no thyromegaly.  Lungs: Clear to auscultation, nonlabored breathing at rest.  Cardiac: Regular rate and rhythm, no S3, 2/6 systolic murmur at base - does not change significantly with standing, no pericardial rub..  Abdomen: Soft, nontender, bowel sounds present, no guarding or rebound.    Extremities: No pitting edema, distal pulses 2+. Venous stasis and hyperpigmentation.     Problem List and Plan   Essential hypertension, benign We will add Aldactone to her current regimen. Also discussed exercise, sodium restriction, weight loss. Followup 6 weeks with BMET.  Hypertrophic cardiomyopathy Clinically stable at this point.    Jonelle Sidle, M.D., F.A.C.C.

## 2012-05-02 ENCOUNTER — Ambulatory Visit (INDEPENDENT_AMBULATORY_CARE_PROVIDER_SITE_OTHER): Payer: Self-pay | Admitting: Cardiology

## 2012-05-02 ENCOUNTER — Encounter: Payer: Self-pay | Admitting: Cardiology

## 2012-05-02 VITALS — BP 136/82 | HR 66 | Ht 67.0 in | Wt 244.0 lb

## 2012-05-02 DIAGNOSIS — I422 Other hypertrophic cardiomyopathy: Secondary | ICD-10-CM

## 2012-05-02 DIAGNOSIS — I10 Essential (primary) hypertension: Secondary | ICD-10-CM

## 2012-05-02 NOTE — Patient Instructions (Addendum)
Your physician recommends that you schedule a follow-up appointment in: 3 MONTHS WITH SM  Your physician recommends that you return for lab work: ONE WEEK (BMET)   Your physician recommends that you continue on your current medications as directed. Please refer to the Current Medication list given to you today.

## 2012-05-02 NOTE — Progress Notes (Signed)
   Clinical Summary [name] is a 56 y.o.female presenting for followup. She was seen in October. Aldactone was added to her medical regimen at that point. Followup BMET requested, although patient did not present for this.  ECG today shows sinus rhythm with LVH, LAFB, NSST changes.  Her blood pressure looks much better today. She has been taking her once daily medications in the evening and Hydralazine QID. No chest pain or dyspnea.  Allergies  Allergen Reactions  . Codeine     Current Outpatient Prescriptions  Medication Sig Dispense Refill  . aspirin 81 MG tablet Take 81 mg by mouth daily.      Marland Kitchen atenolol (TENORMIN) 100 MG tablet Take 1 tablet (100 mg total) by mouth daily.  30 tablet  6  . hydrALAZINE (APRESOLINE) 25 MG tablet Take 4 tablets (100 mg total) by mouth 3 (three) times daily.  270 tablet  3  . lisinopril (PRINIVIL,ZESTRIL) 40 MG tablet Take 1 tablet (40 mg total) by mouth daily.  30 tablet  12  . spironolactone (ALDACTONE) 25 MG tablet Take 1 tablet (25 mg total) by mouth daily.  90 tablet  3    Past Medical History  Diagnosis Date  . Essential hypertension, benign   . Hypertrophic non-obstructive cardiomyopathy     Echocardiogram 2011    Social History Traci Chandler reports that she has never smoked. She has never used smokeless tobacco. [name] reports that she does not drink alcohol.  Review of Systems Negative except as outlined.  Physical Examination Filed Vitals:   05/02/12 1405  BP: 136/82  Pulse: 66   Filed Weights   05/02/12 1405  Weight: 244 lb (110.678 kg)    HEENT: Conjunctiva and lids normal, oropharynx clear.  Neck: Supple, no elevated JVP or carotid bruits, no thyromegaly.  Lungs: Clear to auscultation, nonlabored breathing at rest.  Cardiac: Regular rate and rhythm, no S3, 2/6 systolic murmur at base - does not change significantly with standing, no pericardial rub..  Abdomen: Soft, nontender, bowel sounds present, no guarding  or rebound.  Extremities: No pitting edema, distal pulses 2+.   Problem List and Plan   Essential hypertension, benign Blood pressure is better controlled. Will continue current regimen and check BMET. Followup arranged in 3 months.    Jonelle Sidle, M.D., F.A.C.C.

## 2012-05-02 NOTE — Assessment & Plan Note (Signed)
Blood pressure is better controlled. Will continue current regimen and check BMET. Followup arranged in 3 months.

## 2012-05-12 ENCOUNTER — Telehealth: Payer: Self-pay | Admitting: *Deleted

## 2012-05-12 ENCOUNTER — Encounter: Payer: Self-pay | Admitting: *Deleted

## 2012-05-12 NOTE — Telephone Encounter (Signed)
Outgoing letter has been mailed to pt to advise her labs are now due for BMET, pt was to have them drawn one week from 05-02-12 OV however at this date 05-12-12 pt has not had labs drawn, included another lab slip for pt to take to [facility]. Reminder extended for pt BMET

## 2012-07-30 ENCOUNTER — Ambulatory Visit: Payer: Self-pay | Admitting: Cardiology

## 2012-08-04 ENCOUNTER — Ambulatory Visit: Payer: Self-pay | Admitting: Adult Health

## 2012-08-29 ENCOUNTER — Other Ambulatory Visit: Payer: Self-pay | Admitting: Cardiology

## 2012-09-08 ENCOUNTER — Ambulatory Visit (INDEPENDENT_AMBULATORY_CARE_PROVIDER_SITE_OTHER): Payer: Self-pay | Admitting: Cardiology

## 2012-09-08 ENCOUNTER — Encounter: Payer: Self-pay | Admitting: Cardiology

## 2012-09-08 VITALS — BP 199/86 | HR 60 | Wt 240.0 lb

## 2012-09-08 DIAGNOSIS — I422 Other hypertrophic cardiomyopathy: Secondary | ICD-10-CM

## 2012-09-08 DIAGNOSIS — I10 Essential (primary) hypertension: Secondary | ICD-10-CM

## 2012-09-08 MED ORDER — SPIRONOLACTONE 25 MG PO TABS: 25.0000 mg | ORAL_TABLET | Freq: Two times a day (BID) | ORAL | Status: DC

## 2012-09-08 NOTE — Assessment & Plan Note (Signed)
This has essentially been asymptomatic. Stable cardiac murmur on exam.

## 2012-09-08 NOTE — Patient Instructions (Addendum)
Your physician recommends that you schedule a follow-up appointment in:  Your physician has recommended you make the following change in your medication:   1) INCREASE ALDACTONE 25MG  TO TWICE DAILY  Your physician recommends that you return for lab work in: 2 WEEKS AFTER YOU INCREASE ALDACTONE (SLIPS GIVEN)

## 2012-09-08 NOTE — Assessment & Plan Note (Signed)
Can try an increase Aldactone to 25 mg twice daily, will need a followup BMET in 2 weeks to ensure stable potassium and renal function. Otherwise continue present regimen. I once again reinforced diet with goal of weight loss, sodium restriction, and a basic walking regimen.

## 2012-09-08 NOTE — Progress Notes (Signed)
   Clinical Summary Ms. Seward Meth is a 57 y.o.female last seen in November 2013. She continues to follow at the [facility] for primary care. Lab work was obtained back in January showing potassium 4.5, BUN 14, creatinine 1.1. LDL cholesterol was only 100.  Her blood pressure remains elevated. She states she has been compliant with her medications. I reviewed with her diet, goal of weight loss, sodium restriction, also a basic aerobic exercise regimen such as walking.  She has had no palpitations, chest pain, or syncope.  She states that she was to have an increase in aldactone to 50 mg daily per her [facility].   Allergies  Allergen Reactions  . Codeine     Current Outpatient Prescriptions  Medication Sig Dispense Refill  . aspirin 81 MG tablet Take 81 mg by mouth daily.      Marland Kitchen atenolol (TENORMIN) 100 MG tablet Take 1 tablet (100 mg total) by mouth daily.  30 tablet  6  . hydrALAZINE (APRESOLINE) 25 MG tablet TAKE FOUR TABLETS BY MOUTH THREE TIMES DAILY  270 tablet  0  . lisinopril (PRINIVIL,ZESTRIL) 40 MG tablet Take 1 tablet (40 mg total) by mouth daily.  30 tablet  12  . spironolactone (ALDACTONE) 25 MG tablet Take 1 tablet (25 mg total) by mouth 2 (two) times daily.  90 tablet  1   No current facility-administered medications for this visit.    Past Medical History  Diagnosis Date  . Essential hypertension, benign   . Hypertrophic non-obstructive cardiomyopathy     Echocardiogram 2011    Social History [name] reports that she has never smoked. She has never used smokeless tobacco. Ms. Seward Meth reports that she does not drink alcohol.  Review of Systems Negative except as outlined.  Physical Examination Filed Vitals:   09/08/12 0849  BP: 199/86  Pulse: 60   Filed Weights   09/08/12 0849  Weight: 240 lb (108.863 kg)    HEENT: Conjunctiva and lids normal, oropharynx clear.  Neck: Supple, no elevated JVP or carotid bruits, no  thyromegaly.  Lungs: Clear to auscultation, nonlabored breathing at rest.  Cardiac: Regular rate and rhythm, no S3, 2/6 systolic murmur at base - mild increase in intensity with standing, no pericardial rub..  Abdomen: Soft, nontender, bowel sounds present, no guarding or rebound.  Extremities: No pitting edema, distal pulses 2+.   Problem List and Plan   Essential hypertension, benign Can try an increase Aldactone to 25 mg twice daily, will need a followup BMET in 2 weeks to ensure stable potassium and renal function. Otherwise continue present regimen. I once again reinforced diet with goal of weight loss, sodium restriction, and a basic walking regimen.  Hypertrophic cardiomyopathy This has essentially been asymptomatic. Stable cardiac murmur on exam.    Jonelle Sidle, M.D., F.A.C.C.

## 2012-09-09 ENCOUNTER — Encounter: Payer: Self-pay | Admitting: Cardiology

## 2012-10-03 ENCOUNTER — Encounter: Payer: Self-pay | Admitting: Cardiology

## 2012-10-13 ENCOUNTER — Encounter (HOSPITAL_COMMUNITY): Payer: Self-pay | Admitting: *Deleted

## 2012-10-13 ENCOUNTER — Emergency Department (HOSPITAL_COMMUNITY)
Admission: EM | Admit: 2012-10-13 | Discharge: 2012-10-13 | Disposition: A | Discharge status: Home / Self Care | Payer: Self-pay | Attending: Emergency Medicine | Admitting: Emergency Medicine

## 2012-10-13 ENCOUNTER — Emergency Department (HOSPITAL_COMMUNITY): Payer: Self-pay

## 2012-10-13 DIAGNOSIS — Z8679 Personal history of other diseases of the circulatory system: Secondary | ICD-10-CM | POA: Insufficient documentation

## 2012-10-13 DIAGNOSIS — K449 Diaphragmatic hernia without obstruction or gangrene: Secondary | ICD-10-CM | POA: Insufficient documentation

## 2012-10-13 DIAGNOSIS — Z90710 Acquired absence of both cervix and uterus: Secondary | ICD-10-CM | POA: Insufficient documentation

## 2012-10-13 DIAGNOSIS — R197 Diarrhea, unspecified: Secondary | ICD-10-CM | POA: Insufficient documentation

## 2012-10-13 DIAGNOSIS — Z79899 Other long term (current) drug therapy: Secondary | ICD-10-CM | POA: Insufficient documentation

## 2012-10-13 DIAGNOSIS — N390 Urinary tract infection, site not specified: Secondary | ICD-10-CM | POA: Insufficient documentation

## 2012-10-13 DIAGNOSIS — I10 Essential (primary) hypertension: Secondary | ICD-10-CM | POA: Insufficient documentation

## 2012-10-13 DIAGNOSIS — R1013 Epigastric pain: Secondary | ICD-10-CM | POA: Insufficient documentation

## 2012-10-13 LAB — COMPREHENSIVE METABOLIC PANEL
ALT: 15 U/L (ref 0–35)
AST: 21 U/L (ref 0–37)
Albumin: 4 g/dL (ref 3.5–5.2)
Calcium: 10.1 mg/dL (ref 8.4–10.5)
Creatinine, Ser: 1.29 mg/dL — ABNORMAL HIGH (ref 0.50–1.10)
Sodium: 138 mEq/L (ref 135–145)

## 2012-10-13 LAB — CBC WITH DIFFERENTIAL/PLATELET
Basophils Absolute: 0 10*3/uL (ref 0.0–0.1)
Basophils Relative: 0 % (ref 0–1)
Eosinophils Relative: 2 % (ref 0–5)
HCT: 40.7 % (ref 36.0–46.0)
MCHC: 34.6 g/dL (ref 30.0–36.0)
MCV: 87.9 fL (ref 78.0–100.0)
Monocytes Absolute: 0.5 10*3/uL (ref 0.1–1.0)
Neutro Abs: 2.4 10*3/uL (ref 1.7–7.7)
Platelets: 129 10*3/uL — ABNORMAL LOW (ref 150–400)
RDW: 12.5 % (ref 11.5–15.5)
WBC: 4.9 10*3/uL (ref 4.0–10.5)

## 2012-10-13 LAB — URINALYSIS, ROUTINE W REFLEX MICROSCOPIC
Bilirubin Urine: NEGATIVE
Glucose, UA: NEGATIVE mg/dL
Ketones, ur: NEGATIVE mg/dL
Nitrite: POSITIVE — AB
Specific Gravity, Urine: 1.02 (ref 1.005–1.030)
pH: 7.5 (ref 5.0–8.0)

## 2012-10-13 LAB — URINE MICROSCOPIC-ADD ON

## 2012-10-13 LAB — TROPONIN I: Troponin I: <0.3 ng/mL (ref <0.30)

## 2012-10-13 MED ORDER — OXYCODONE-ACETAMINOPHEN 5-325 MG PO TABS: 1.0000 | ORAL_TABLET | Freq: Four times a day (QID) | ORAL | Status: DC | PRN

## 2012-10-13 MED ORDER — ONDANSETRON 4 MG PO TBDP
ORAL_TABLET | ORAL | Status: AC
  Filled 2012-10-13: qty 1

## 2012-10-13 MED ORDER — SODIUM CHLORIDE 0.9 % IV BOLUS (SEPSIS)
1000.0000 mL | Freq: Once | INTRAVENOUS | Status: AC
  Administered 2012-10-13: 1000 mL via INTRAVENOUS

## 2012-10-13 MED ORDER — CEPHALEXIN 500 MG PO CAPS: 500.0000 mg | ORAL_CAPSULE | Freq: Four times a day (QID) | ORAL | Status: DC

## 2012-10-13 MED ORDER — ONDANSETRON HCL 4 MG/2ML IJ SOLN: 4.0000 mg | Freq: Once | INTRAMUSCULAR | Status: DC

## 2012-10-13 MED ORDER — ONDANSETRON HCL 4 MG PO TABS: 4.0000 mg | ORAL_TABLET | Freq: Three times a day (TID) | ORAL | Status: DC | PRN

## 2012-10-13 MED ORDER — ONDANSETRON HCL 4 MG/2ML IJ SOLN
4.0000 mg | Freq: Once | INTRAMUSCULAR | Status: AC
  Administered 2012-10-13: 4 mg via INTRAVENOUS
  Filled 2012-10-13: qty 2

## 2012-10-13 MED ORDER — HYDROMORPHONE HCL PF 1 MG/ML IJ SOLN
1.0000 mg | Freq: Once | INTRAMUSCULAR | Status: AC
  Administered 2012-10-13: 1 mg via INTRAVENOUS
  Filled 2012-10-13: qty 1

## 2012-10-13 MED ORDER — DEXTROSE 5 % IV SOLN
1.0000 g | Freq: Once | INTRAVENOUS | Status: AC
  Administered 2012-10-13: 1 g via INTRAVENOUS
  Filled 2012-10-13: qty 10

## 2012-10-13 MED ORDER — ONDANSETRON 4 MG PO TBDP: 4.0000 mg | ORAL_TABLET | Freq: Once | ORAL | Status: AC

## 2012-10-13 NOTE — ED Notes (Signed)
Epigastric pain for 1 week, No n/v,  bm's looser that usual.

## 2012-10-13 NOTE — ED Provider Notes (Signed)
History    This chart was scribed for Charles B. Bernette Mayers, MD by Gerlean Ren, ED Scribe. This patient was seen in room [facility]18[facility] and the patient's care was started at 8:33 PM    CSN: [id number]  Arrival date & time 10/13/12  2001   First MD Initiated Contact with Patient 10/13/12 2029      Chief Complaint  Patient presents with  . Abdominal Pain     The history is provided by the patient. No language interpreter was used.   Traci Chandler is a 57 y.o. female who presents to the Emergency Department complaining of one week on constant epigastric abdominal pain that is worsened after eating but constantly present.  Pt reports some associated loose stools.  Pt denies nausea, emesis, blood in stool or black/tarry stools.  Pt denies true SOB but states the pain takes her breath at times.   Pt still has her gall bladder.  Pt has hiatal hernia.   Pt has h/o HTN. Past Medical History  Diagnosis Date  . Essential hypertension, benign   . Hypertrophic non-obstructive cardiomyopathy     Echocardiogram 2011    Past Surgical History  Procedure Laterality Date  . Abdominal hysterectomy      Family History  Problem Relation Age of Onset  . Diabetes type II Mother   . Diabetes type II Sister   . Hypertension Mother   . Hypertension Sister     History  Substance Use Topics  . Smoking status: Never Smoker   . Smokeless tobacco: Never Used  . Alcohol Use: No    No OB history provided.   Review of Systems  Constitutional: Negative for fever and chills.  Respiratory: Negative for shortness of breath.   Gastrointestinal: Positive for abdominal pain. Negative for nausea, vomiting, diarrhea, constipation and blood in stool.  Neurological: Negative for weakness.  All other systems reviewed and are negative.    Allergies  Codeine  Home Medications   Current Outpatient Rx  Name  Route  Sig  Dispense  Refill  . acetaminophen (TYLENOL) 500 MG tablet   Oral   Take 500-1,000 mg  by mouth daily as needed for pain.         Marland Kitchen alum & mag hydroxide-simeth (MYLANTA) 200-200-20 MG/5ML suspension   Oral   Take by mouth as needed for indigestion Ottis Stain).         Marland Kitchen aspirin 81 MG tablet   Oral   Take 81 mg by mouth every evening.          Marland Kitchen atenolol (TENORMIN) 100 MG tablet   Oral   Take 100 mg by mouth every evening.         . calcium carbonate (TUMS - DOSED IN MG ELEMENTAL CALCIUM) 500 MG chewable tablet   Oral   Chew 1 tablet by mouth daily as needed for heartburn (FOR PAIN).         . hydrALAZINE (APRESOLINE) 25 MG tablet   Oral   Take 100 mg by mouth 3 (three) times daily.         Marland Kitchen lisinopril (PRINIVIL,ZESTRIL) 40 MG tablet   Oral   Take 40 mg by mouth every evening.         . naproxen sodium (ALEVE) 220 MG tablet   Oral   Take 220 mg by mouth once as needed (FOR PAIN).         Marland Kitchen spironolactone (ALDACTONE) 25 MG tablet   Oral   Take  1 tablet (25 mg total) by mouth 2 (two) times daily.   90 tablet   1     BP 230/87  Pulse 64  Temp(Src) 98.2 F (36.8 C) (Oral)  Resp 20  Ht 5\' 7"  (1.702 m)  Wt 236 lb 8 oz (107.276 kg)  BMI 37.03 kg/m2  SpO2 100%  Physical Exam  Nursing note and vitals reviewed. Constitutional: She is oriented to person, place, and time. She appears well-developed and well-nourished.  HENT:  Head: Normocephalic and atraumatic.  Eyes: EOM are normal. Pupils are equal, round, and reactive to light.  Neck: Normal range of motion. Neck supple.  Cardiovascular: Normal rate, normal heart sounds and intact distal pulses.   Pulmonary/Chest: Effort normal and breath sounds normal.  Abdominal: Bowel sounds are normal. She exhibits no distension. There is tenderness. There is no rebound and no guarding.  Epigastric tenderness  Musculoskeletal: Normal range of motion. She exhibits no edema and no tenderness.  Neurological: She is alert and oriented to person, place, and time. She has normal strength. No cranial nerve  deficit or sensory deficit.  Skin: Skin is warm and dry. No rash noted.  Psychiatric: She has a normal mood and affect.    ED Course  Procedures (including critical care time) DIAGNOSTIC STUDIES: Oxygen Saturation is 100% on room air, normal by my interpretation.    COORDINATION OF CARE: 8:38 PM- Patient informed of clinical course, understands medical decision-making process, and agrees with plan.    Results for orders placed during the hospital encounter of 10/13/12  URINALYSIS, ROUTINE W REFLEX MICROSCOPIC      Result Value Range   Color, Urine YELLOW  YELLOW   APPearance HAZY (*) CLEAR   Specific Gravity, Urine 1.020  1.005 - 1.030   pH 7.5  5.0 - 8.0   Glucose, UA NEGATIVE  NEGATIVE mg/dL   Hgb urine dipstick MODERATE (*) NEGATIVE   Bilirubin Urine NEGATIVE  NEGATIVE   Ketones, ur NEGATIVE  NEGATIVE mg/dL   Protein, ur 30 (*) NEGATIVE mg/dL   Urobilinogen, UA 0.2  0.0 - 1.0 mg/dL   Nitrite POSITIVE (*) NEGATIVE   Leukocytes, UA LARGE (*) NEGATIVE  URINE MICROSCOPIC-ADD ON      Result Value Range   Squamous Epithelial / LPF MANY (*) RARE   WBC, UA TOO NUMEROUS TO COUNT  <3 WBC/hpf   RBC / HPF 11-20  <3 RBC/hpf   Bacteria, UA MANY (*) RARE    No results found.   1. Epigastric pain   2. UTI (urinary tract infection)       MDM   Date: 10/13/2012  Rate: 63  Rhythm: normal sinus rhythm  QRS Axis: left  Intervals: normal  ST/T Wave abnormalities: normal  Conduction Disutrbances:left bundle branch block, incomplete  Narrative Interpretation: [facility]  Old EKG Reviewed: unchanged   Epigastric pain without fever. No urinary symptoms but has UTI. Pain improved with meds. No evidence of cardiac etiology. Given Rocephin in the ED. Rx for Keflex. PCP followup.    I personally performed the services described in this documentation, which was scribed in my presence. The recorded information has been reviewed and is accurate.       Charles B. Bernette Mayers, MD 10/13/12  [phone]

## 2012-10-16 LAB — URINE CULTURE

## 2012-10-20 ENCOUNTER — Encounter (HOSPITAL_COMMUNITY): Payer: Self-pay

## 2012-10-20 ENCOUNTER — Emergency Department (HOSPITAL_COMMUNITY)
Admission: EM | Admit: 2012-10-20 | Discharge: 2012-10-20 | Disposition: A | Discharge status: Home / Self Care | Payer: Self-pay | Attending: Emergency Medicine | Admitting: Emergency Medicine

## 2012-10-20 DIAGNOSIS — Z79899 Other long term (current) drug therapy: Secondary | ICD-10-CM | POA: Insufficient documentation

## 2012-10-20 DIAGNOSIS — Z7982 Long term (current) use of aspirin: Secondary | ICD-10-CM | POA: Insufficient documentation

## 2012-10-20 DIAGNOSIS — Z90710 Acquired absence of both cervix and uterus: Secondary | ICD-10-CM | POA: Insufficient documentation

## 2012-10-20 DIAGNOSIS — R1013 Epigastric pain: Secondary | ICD-10-CM | POA: Insufficient documentation

## 2012-10-20 DIAGNOSIS — Z8679 Personal history of other diseases of the circulatory system: Secondary | ICD-10-CM | POA: Insufficient documentation

## 2012-10-20 DIAGNOSIS — I10 Essential (primary) hypertension: Secondary | ICD-10-CM | POA: Insufficient documentation

## 2012-10-20 LAB — HEPATIC FUNCTION PANEL
ALT: 13 U/L (ref 0–35)
AST: 15 U/L (ref 0–37)
Albumin: 4 g/dL (ref 3.5–5.2)
Total Protein: 7.5 g/dL (ref 6.0–8.3)

## 2012-10-20 MED ORDER — OMEPRAZOLE 20 MG PO CPDR: 20.0000 mg | DELAYED_RELEASE_CAPSULE | Freq: Every day | ORAL | Status: DC

## 2012-10-20 NOTE — ED Notes (Signed)
Complain of epigastric pain/burning past eating. Also, pain in right hip area

## 2012-10-24 NOTE — ED Provider Notes (Signed)
Medical screening examination/treatment/procedure(s) were performed by non-physician practitioner and as supervising physician I was immediately available for consultation/collaboration.   Dione Booze, MD 10/24/12 2259

## 2012-10-24 NOTE — ED Provider Notes (Signed)
History     CSN: [id number]  Arrival date & time 10/20/12  1028   First MD Initiated Contact with Patient 10/20/12 1126      Chief Complaint  Patient presents with  . Abdominal Pain    (Consider location/radiation/quality/duration/timing/severity/associated sxs/prior treatment) HPI Comments: Traci Chandler is a 57 y.o. Female presenting with epigastric pain which she describes as burning and constantly present but worse after eating meals.  She has had a softer stool as well,  But denies diarrhea or blood in her stool.  She has had no vomiting, fevers, chills, shortness of breath but describes the burning radiates into her mid sternal area and is worsened after meals.  She was seen here one week ago for the same complaint and her lab tests including cardiology workup were negative except for an asymptomatic (and still so)  urinary infection for which she is still taking keflex.  She is concerned her pain is related to her gallbladder, but also relates she may need an antacid or nexium as her symptoms started when she started adding red pepper to her food after she heard on the Dr. Neil Crouch show that this will help weight loss.  She states she has been sprinkling it on everything, even her popcorn.     The history is provided by the patient.    Past Medical History  Diagnosis Date  . Essential hypertension, benign   . Hypertrophic non-obstructive cardiomyopathy     Echocardiogram 2011    Past Surgical History  Procedure Laterality Date  . Abdominal hysterectomy      Family History  Problem Relation Age of Onset  . Diabetes type II Mother   . Diabetes type II Sister   . Hypertension Mother   . Hypertension Sister     History  Substance Use Topics  . Smoking status: Never Smoker   . Smokeless tobacco: Never Used  . Alcohol Use: No    OB History   Grav Para Term Preterm Abortions TAB SAB Ect Mult Living                  Review of Systems  Constitutional: Negative for  fever.  HENT: Negative for congestion, sore throat and neck pain.   Eyes: Negative.   Respiratory: Negative for cough, chest tightness and shortness of breath.   Cardiovascular: Negative for chest pain.  Gastrointestinal: Positive for abdominal pain. Negative for nausea, vomiting, diarrhea and constipation.  Genitourinary: Negative.   Musculoskeletal: Negative for joint swelling and arthralgias.  Skin: Negative.  Negative for rash and wound.  Neurological: Negative for dizziness, weakness, light-headedness, numbness and headaches.  Psychiatric/Behavioral: Negative.     Allergies  Codeine  Home Medications   Current Outpatient Rx  Name  Route  Sig  Dispense  Refill  . aspirin 81 MG tablet   Oral   Take 81 mg by mouth every evening.          Marland Kitchen atenolol (TENORMIN) 100 MG tablet   Oral   Take 100 mg by mouth every evening.         . cephALEXin (KEFLEX) 500 MG capsule   Oral   Take 1 capsule (500 mg total) by mouth 4 (four) times daily.   20 capsule   0   . hydrALAZINE (APRESOLINE) 25 MG tablet   Oral   Take 100 mg by mouth 4 (four) times daily.          Marland Kitchen lisinopril (PRINIVIL,ZESTRIL) 40 MG tablet  Oral   Take 40 mg by mouth every evening.         . ondansetron (ZOFRAN) 4 MG tablet   Oral   Take 1 tablet (4 mg total) by mouth every 8 (eight) hours as needed for nausea.   12 tablet   0   . oxyCODONE-acetaminophen (PERCOCET/ROXICET) 5-325 MG per tablet   Oral   Take 1-2 tablets by mouth every 6 (six) hours as needed for pain.   20 tablet   0   . spironolactone (ALDACTONE) 25 MG tablet   Oral   Take 1 tablet (25 mg total) by mouth 2 (two) times daily.   90 tablet   1   . omeprazole (PRILOSEC) 20 MG capsule   Oral   Take 1 capsule (20 mg total) by mouth daily.   20 capsule   0     BP 203/77  Pulse 60  Temp(Src) 97.9 F (36.6 C) (Oral)  Resp 18  Ht 5\' 7"  (1.702 m)  Wt 236 lb (107.049 kg)  BMI 36.95 kg/m2  SpO2 97%  Physical Exam  Nursing  note and vitals reviewed. Constitutional: She appears well-developed and well-nourished.  HENT:  Head: Normocephalic and atraumatic.  Eyes: Conjunctivae are normal.  Neck: Normal range of motion.  Cardiovascular: Normal rate, regular rhythm, normal heart sounds and intact distal pulses.   Pulmonary/Chest: Effort normal and breath sounds normal. She has no wheezes.  Abdominal: Soft. Bowel sounds are normal. There is tenderness in the epigastric area. There is negative Murphy's sign.  Musculoskeletal: Normal range of motion.  Neurological: She is alert.  Skin: Skin is warm and dry.  Psychiatric: She has a normal mood and affect.    ED Course  Procedures (including critical care time)  Labs Reviewed  HEPATIC FUNCTION PANEL  LIPASE, BLOOD   No results found.   1. Epigastric pain       MDM  Pt with epigastric pain after adding red pepper to her diet.  Suspect acid reflux/gastritis as source.  Today labs and workup from last was reviewed as well.  UA not repeated as pt still is asymptomatic from uti standpoint,  And still on her abx.  She was prescribed prilosec,  Asked pt to consider stopping the red pepper supplement which is probably the source of pain and reflux.  Encouraged to f/u with pcp for a recheck this week. Also advised that her bp is elevated today.  She takes her bp meds in the evening,  Has not taken today yet.  Encouraged recheck by pcp and/or cardiologist.  May need to have medicine dosing adjusted or new med added if bp elevation persists. Pt understands plan.  Patients labs and/or radiological studies were viewed and considered during the medical decision making and disposition process.         Burgess Amor, PA-C 10/24/12 1644

## 2012-11-19 ENCOUNTER — Other Ambulatory Visit: Payer: Self-pay | Admitting: Cardiology

## 2012-11-19 NOTE — Telephone Encounter (Signed)
PT CALLING ALSO TO GET RX REFILL ON LISINOPRIL

## 2012-11-24 ENCOUNTER — Encounter: Payer: Self-pay | Admitting: Cardiology

## 2012-11-24 NOTE — Progress Notes (Signed)
Rescheduled This encounter was created in error - please disregard. 

## 2012-11-26 ENCOUNTER — Other Ambulatory Visit (HOSPITAL_COMMUNITY): Payer: Self-pay | Admitting: Physician Assistant

## 2012-11-26 DIAGNOSIS — R109 Unspecified abdominal pain: Secondary | ICD-10-CM

## 2012-12-01 ENCOUNTER — Ambulatory Visit (HOSPITAL_COMMUNITY): Payer: BC Managed Care – PPO

## 2012-12-02 ENCOUNTER — Ambulatory Visit (HOSPITAL_COMMUNITY)
Admission: RE | Admit: 2012-12-02 | Discharge: 2012-12-02 | Disposition: A | Discharge status: Home / Self Care | Payer: BC Managed Care – PPO | Source: Ambulatory Visit | Attending: Physician Assistant | Admitting: Physician Assistant

## 2012-12-02 DIAGNOSIS — R109 Unspecified abdominal pain: Secondary | ICD-10-CM | POA: Insufficient documentation

## 2012-12-02 DIAGNOSIS — R932 Abnormal findings on diagnostic imaging of liver and biliary tract: Secondary | ICD-10-CM | POA: Insufficient documentation

## 2012-12-08 ENCOUNTER — Encounter: Payer: Self-pay | Admitting: Adult Health

## 2012-12-08 NOTE — Progress Notes (Signed)
   HPI: Traci Chandler is a 57 year old patient of Dr. Diona Browner were following for ongoing assessment and management of hypertension with history of hypertrophic cardiomyopathy. The patient was last seen by Dr. Diona Browner in March of 2014. She had mildly elevated blood pressure, with a history of dietary noncompliance. Her weight on last visit was 240 pounds. Aldactone was increased to 25 mg twice a day.   She did call her office on 12/04/2002 for weight gain. She was started on extra doses of Lasix temporarily.   Allergies  Allergen Reactions  . Codeine Nausea And Vomiting    Current Outpatient Prescriptions  Medication Sig Dispense Refill  . aspirin 81 MG tablet Take 81 mg by mouth every evening.       Marland Kitchen atenolol (TENORMIN) 100 MG tablet Take 100 mg by mouth every evening.      . cephALEXin (KEFLEX) 500 MG capsule Take 1 capsule (500 mg total) by mouth 4 (four) times daily.  20 capsule  0  . hydrALAZINE (APRESOLINE) 25 MG tablet Take 100 mg by mouth 4 (four) times daily.       Marland Kitchen lisinopril (PRINIVIL,ZESTRIL) 40 MG tablet Take 40 mg by mouth every evening.      Marland Kitchen lisinopril (PRINIVIL,ZESTRIL) 40 MG tablet TAKE ONE TABLET BY MOUTH EVERY DAY  30 tablet  0  . omeprazole (PRILOSEC) 20 MG capsule Take 1 capsule (20 mg total) by mouth daily.  20 capsule  0  . ondansetron (ZOFRAN) 4 MG tablet Take 1 tablet (4 mg total) by mouth every 8 (eight) hours as needed for nausea.  12 tablet  0  . oxyCODONE-acetaminophen (PERCOCET/ROXICET) 5-325 MG per tablet Take 1-2 tablets by mouth every 6 (six) hours as needed for pain.  20 tablet  0  . spironolactone (ALDACTONE) 25 MG tablet Take 1 tablet (25 mg total) by mouth 2 (two) times daily.  90 tablet  1   No current facility-administered medications for this visit.    Past Medical History  Diagnosis Date  . Essential hypertension, benign   . Hypertrophic non-obstructive cardiomyopathy     Echocardiogram 2011    Past Surgical History  Procedure Laterality  Date  . Abdominal hysterectomy      ROS: PHYSICAL EXAM There were no vitals taken for this visit.  EKG:  ASSESSMENT AND PLAN

## 2013-01-01 ENCOUNTER — Other Ambulatory Visit (HOSPITAL_COMMUNITY): Payer: Self-pay | Admitting: Physician Assistant

## 2013-01-01 DIAGNOSIS — Z139 Encounter for screening, unspecified: Secondary | ICD-10-CM

## 2013-01-05 ENCOUNTER — Ambulatory Visit (HOSPITAL_COMMUNITY): Payer: BC Managed Care – PPO

## 2013-03-11 ENCOUNTER — Telehealth: Payer: Self-pay

## 2013-03-11 NOTE — Telephone Encounter (Signed)
Pt was referred by Dr. Mirna Mires for screening colonoscopy. She has had some rectal bleeding and not sure if she has hemorrhoids. OV with AS on 04/02/2013 at 9:30 AM.

## 2013-04-02 ENCOUNTER — Encounter (INDEPENDENT_AMBULATORY_CARE_PROVIDER_SITE_OTHER): Payer: Self-pay

## 2013-04-02 ENCOUNTER — Other Ambulatory Visit: Payer: Self-pay | Admitting: Gastroenterology

## 2013-04-02 ENCOUNTER — Encounter: Payer: Self-pay | Admitting: Gastroenterology

## 2013-04-02 ENCOUNTER — Ambulatory Visit (INDEPENDENT_AMBULATORY_CARE_PROVIDER_SITE_OTHER): Payer: BC Managed Care – PPO | Admitting: Gastroenterology

## 2013-04-02 VITALS — BP 141/85 | HR 92 | Temp 97.4°F | Ht 67.0 in | Wt 237.6 lb

## 2013-04-02 DIAGNOSIS — Z1211 Encounter for screening for malignant neoplasm of colon: Secondary | ICD-10-CM

## 2013-04-02 MED ORDER — PEG 3350-KCL-NA BICARB-NACL 420 G PO SOLR: 4000.0000 mL | ORAL | Status: DC

## 2013-04-02 NOTE — Progress Notes (Signed)
cc'd to pcp 

## 2013-04-02 NOTE — Patient Instructions (Signed)
We have scheduled you for a colonoscopy with Dr. Darrick Penna in the near future.  Keep up the good work with exercise!

## 2013-04-02 NOTE — Progress Notes (Signed)
Referring Provider: No ref. provider found Primary Care Physician:  Evlyn Courier, MD Primary GI: Dr. Darrick Penna   Chief Complaint  Patient presents with  . Colonoscopy    HPI:   Traci Chandler presents today at the request of Dr. Mirna Mires for screening colonoscopy. No prior lower GI evaluation. Notes intermittent low-volume hematochezia with straining for the past few years. No rectal discomfort or itching. Occasional constipation. Used to love turnip greens; now it "binds me up". Able to adjust diet to deal with constipation. No abdominal pain. Dealt with indigestion in the summer but resolved after stopping Lisinopril. Takes Prilosec very rarely. No dysphagia. No unexplained weight loss or lack of appetite.   Past Medical History  Diagnosis Date  . Essential hypertension, benign   . Hypertrophic non-obstructive cardiomyopathy     Echocardiogram 2011    Past Surgical History  Procedure Laterality Date  . Abdominal hysterectomy      Current Outpatient Prescriptions  Medication Sig Dispense Refill  . aspirin 81 MG tablet Take 81 mg by mouth every evening.       Marland Kitchen atenolol (TENORMIN) 100 MG tablet Take 100 mg by mouth every evening.      . Azilsartan-Chlorthalidone (EDARBYCLOR) 40-25 MG TABS Take by mouth daily.       No current facility-administered medications for this visit.    Allergies as of 04/02/2013 - Review Complete 04/02/2013  Allergen Reaction Noted  . Codeine Nausea And Vomiting 09/12/2011    Family History  Problem Relation Age of Onset  . Diabetes type II Mother   . Diabetes type II Sister   . Hypertension Mother   . Hypertension Sister   . Colon cancer Neg Hx   . Prostate cancer Father     History   Social History  . Marital Status: Single    Spouse Name: N/A    Number of Children: N/A  . Years of Education: N/A   Occupational History  . employed     [employer]   Social History Main Topics  . Smoking status: Never Smoker   .  Smokeless tobacco: Never Used  . Alcohol Use: No  . Drug Use: No  . Sexual Activity: Yes    Birth Control/ Protection: Surgical   Other Topics Concern  . None   Social History Narrative  . None    Review of Systems: Gen: Hot flashes CV: Denies chest pain, palpitations, syncope, peripheral edema, and claudication. Resp: Denies dyspnea at rest, cough, wheezing, coughing up blood, and pleurisy. GI: see HPI Derm: Denies rash, itching, dry skin Psych: Denies depression, anxiety, memory loss, confusion. No homicidal or suicidal ideation.  Heme: Denies bruising, bleeding, and enlarged lymph nodes.  Physical Exam: BP 141/85  Pulse 92  Temp(Src) 97.4 F (36.3 C) (Oral)  Ht 5\' 7"  (1.702 m)  Wt 237 lb 9.6 oz (107.775 kg)  BMI 37.2 kg/m2 General:   Alert and oriented. No distress noted. Pleasant and cooperative.  Head:  Normocephalic and atraumatic. Eyes:  Conjuctiva clear without scleral icterus. Mouth:  Oral mucosa pink and moist. Good dentition. No lesions. Neck:  Supple, without mass or thyromegaly. Heart:  S1, S2 present without murmurs, rubs, or gallops. Regular rate and rhythm. Abdomen:  +BS, soft, non-tender and non-distended. No rebound or guarding. No HSM or masses noted. Msk:  Symmetrical without gross deformities. Normal posture. Extremities:  Without edema. Neurologic:  Alert and  oriented x4;  grossly normal neurologically. Skin:  Intact without significant  lesions or rashes. Cervical Nodes:  No significant cervical adenopathy. Psych:  Alert and cooperative. Normal mood and affect.

## 2013-04-02 NOTE — Assessment & Plan Note (Signed)
57 year old female overdue for initial screening colonoscopy, presenting for average-risk evaluation. Low-volume hematochezia in past likely benign anorectal source in the presence of straining. She has been able to manage constipation by avoiding certain foods; this does not appear to be an issue currently.   Proceed with colonoscopy with Dr. Darrick Penna in the near future. The risks, benefits, and alternatives have been discussed in detail with the patient. They state understanding and desire to proceed.  May be a candidate for CRH banding as outpatient

## 2013-04-07 ENCOUNTER — Encounter (HOSPITAL_COMMUNITY): Payer: Self-pay | Admitting: Pharmacy Technician

## 2013-04-15 ENCOUNTER — Ambulatory Visit (HOSPITAL_COMMUNITY)
Admission: RE | Admit: 2013-04-15 | Discharge: 2013-04-15 | Disposition: A | Discharge status: Home / Self Care | Payer: BC Managed Care – PPO | Source: Ambulatory Visit | Attending: Gastroenterology | Admitting: Gastroenterology

## 2013-04-15 ENCOUNTER — Encounter (HOSPITAL_COMMUNITY)
Admission: RE | Disposition: A | Discharge status: Home / Self Care | Payer: Self-pay | Source: Ambulatory Visit | Attending: Gastroenterology

## 2013-04-15 ENCOUNTER — Encounter (HOSPITAL_COMMUNITY): Payer: Self-pay | Admitting: *Deleted

## 2013-04-15 DIAGNOSIS — K5730 Diverticulosis of large intestine without perforation or abscess without bleeding: Secondary | ICD-10-CM | POA: Insufficient documentation

## 2013-04-15 DIAGNOSIS — K648 Other hemorrhoids: Secondary | ICD-10-CM | POA: Insufficient documentation

## 2013-04-15 DIAGNOSIS — Z1211 Encounter for screening for malignant neoplasm of colon: Secondary | ICD-10-CM | POA: Insufficient documentation

## 2013-04-15 DIAGNOSIS — I10 Essential (primary) hypertension: Secondary | ICD-10-CM | POA: Insufficient documentation

## 2013-04-15 HISTORY — PX: COLONOSCOPY: SHX5424

## 2013-04-15 SURGERY — COLONOSCOPY
Anesthesia: Moderate Sedation

## 2013-04-15 MED ORDER — MIDAZOLAM HCL 5 MG/5ML IJ SOLN
INTRAMUSCULAR | Status: DC | PRN
  Administered 2013-04-15 (×2): 2 mg via INTRAVENOUS
  Administered 2013-04-15: 1 mg via INTRAVENOUS

## 2013-04-15 MED ORDER — SODIUM CHLORIDE 0.9 % IV SOLN
INTRAVENOUS | Status: DC
  Administered 2013-04-15: 09:00:00 via INTRAVENOUS

## 2013-04-15 MED ORDER — STERILE WATER FOR IRRIGATION IR SOLN
Status: DC | PRN
  Administered 2013-04-15: 10:00:00

## 2013-04-15 MED ORDER — MIDAZOLAM HCL 5 MG/5ML IJ SOLN
INTRAMUSCULAR | Status: AC
  Filled 2013-04-15: qty 10

## 2013-04-15 MED ORDER — MEPERIDINE HCL 100 MG/ML IJ SOLN
INTRAMUSCULAR | Status: AC
  Filled 2013-04-15: qty 2

## 2013-04-15 MED ORDER — MEPERIDINE HCL 100 MG/ML IJ SOLN
INTRAMUSCULAR | Status: DC | PRN
  Administered 2013-04-15 (×3): 25 mg via INTRAVENOUS

## 2013-04-15 NOTE — Op Note (Signed)
[facility] [address] [place], [address]   COLONOSCOPY PROCEDURE REPORT  PATIENT: Traci Chandler, Traci Chandler.  MR#: [id number] BIRTHDATE: June 11, 1956 , 57  yrs. old GENDER: Female ENDOSCOPIST: Jonette Eva, MD REFERRED XB:JYNWGN Hill, M.D. PROCEDURE DATE:  04/15/2013 PROCEDURE:   Colonoscopy, screening INDICATIONS:Average risk patient for colon cancer. MEDICATIONS: Demerol 50 mg IV and Versed 5 mg IV  DESCRIPTION OF PROCEDURE:    Physical exam was performed.  Informed consent was obtained from the patient after explaining the benefits, risks, and alternatives to procedure.  The patient was connected to monitor and placed in left lateral position. Continuous oxygen was provided by nasal cannula and IV medicine administered through an indwelling cannula.  After administration of sedation and rectal exam, the patients rectum was intubated and the EC-[id number] ([id number])  colonoscope was advanced under direct visualization to the ileum.  The scope was removed slowly by carefully examining the color, texture, anatomy, and integrity mucosa on the way out.  The patient was recovered in endoscopy and discharged home in satisfactory condition.    COLON FINDINGS: The mucosa appeared normal in the terminal ileum.  , Mild diverticulosis was noted in the sigmoid colon.  Moderate sized internal hemorrhoids were found.  PREP QUALITY: good.  CECAL W/D TIME: 12 minutes     COMPLICATIONS: None  ENDOSCOPIC IMPRESSION: 1.   Normal mucosa in the terminal ileum 2.   Mild diverticulosis in the sigmoid colon 3.   Moderate sized internal hemorrhoids  RECOMMENDATIONS: HIGH FIBER TCS IN 10 YEARS       _______________________________ eSignedJonette Eva, MD 04/15/2013 10:43 AM

## 2013-04-15 NOTE — H&P (Signed)
  Primary Care Physician:  Evlyn Courier, MD Primary Gastroenterologist:  Dr. Darrick Penna  Pre-Procedure History & Physical: HPI:  Traci Chandler is a 57 y.o. female here for COLON CANCER SCREENING.  Past Medical History  Diagnosis Date  . Essential hypertension, benign   . Hypertrophic non-obstructive cardiomyopathy     Echocardiogram 2011    Past Surgical History  Procedure Laterality Date  . Abdominal hysterectomy      Prior to Admission medications   Medication Sig Start Date End Date Taking? Authorizing Provider  aspirin 81 MG tablet Take 81 mg by mouth every evening.    Yes Historical Provider, MD  atenolol (TENORMIN) 100 MG tablet Take 100 mg by mouth every evening. 01/11/12  Yes Jonelle Sidle, MD  Azilsartan-Chlorthalidone (EDARBYCLOR) 40-25 MG TABS Take 1 tablet by mouth daily.    Yes Historical Provider, MD  polyethylene glycol-electrolytes (TRILYTE) 420 G solution Take 4,000 mLs by mouth as directed. 04/02/13  Yes West Bali, MD    Allergies as of 04/02/2013 - Review Complete 04/02/2013  Allergen Reaction Noted  . Codeine Nausea And Vomiting 09/12/2011    Family History  Problem Relation Age of Onset  . Diabetes type II Mother   . Diabetes type II Sister   . Hypertension Mother   . Hypertension Sister   . Colon cancer Neg Hx   . Prostate cancer Father     History   Social History  . Marital Status: Single    Spouse Name: N/A    Number of Children: N/A  . Years of Education: N/A   Occupational History  . employed     [employer]   Social History Main Topics  . Smoking status: Never Smoker   . Smokeless tobacco: Never Used  . Alcohol Use: No  . Drug Use: No  . Sexual Activity: Yes    Birth Control/ Protection: Surgical   Other Topics Concern  . Not on file   Social History Narrative  . No narrative on file    Review of Systems: See HPI, otherwise negative ROS   Physical Exam: BP 176/92  Pulse 58  Temp(Src) 98.2 F (36.8 C)  (Oral)  Resp 20  Ht 5\' 7"  (1.702 m)  Wt 237 lb (107.502 kg)  BMI 37.11 kg/m2  SpO2 94% General:   Alert,  pleasant and cooperative in NAD Head:  Normocephalic and atraumatic. Neck:  Supple; Lungs:  Clear throughout to auscultation.    Heart:  Regular rate and rhythm. Abdomen:  Soft, nontender and nondistended. Normal bowel sounds, without guarding, and without rebound.   Neurologic:  Alert and  oriented x4;  grossly normal neurologically.  Impression/Plan:     SCREENING  Plan:  1. TCS TODAY

## 2013-04-15 NOTE — Progress Notes (Signed)
REVIEWED.  

## 2013-04-17 ENCOUNTER — Encounter (HOSPITAL_COMMUNITY): Payer: Self-pay | Admitting: Gastroenterology

## 2014-04-05 IMAGING — CR DG CHEST 2V
2 series · 2 of 2 positions shown · non-contrast
Comparison: 05/21/2010

CLINICAL DATA: Mid sternal chest discomfort.  Epigastric pain.
Shortness of breath.

CHEST - 2 VIEW

[view not recorded (1 of 2)]
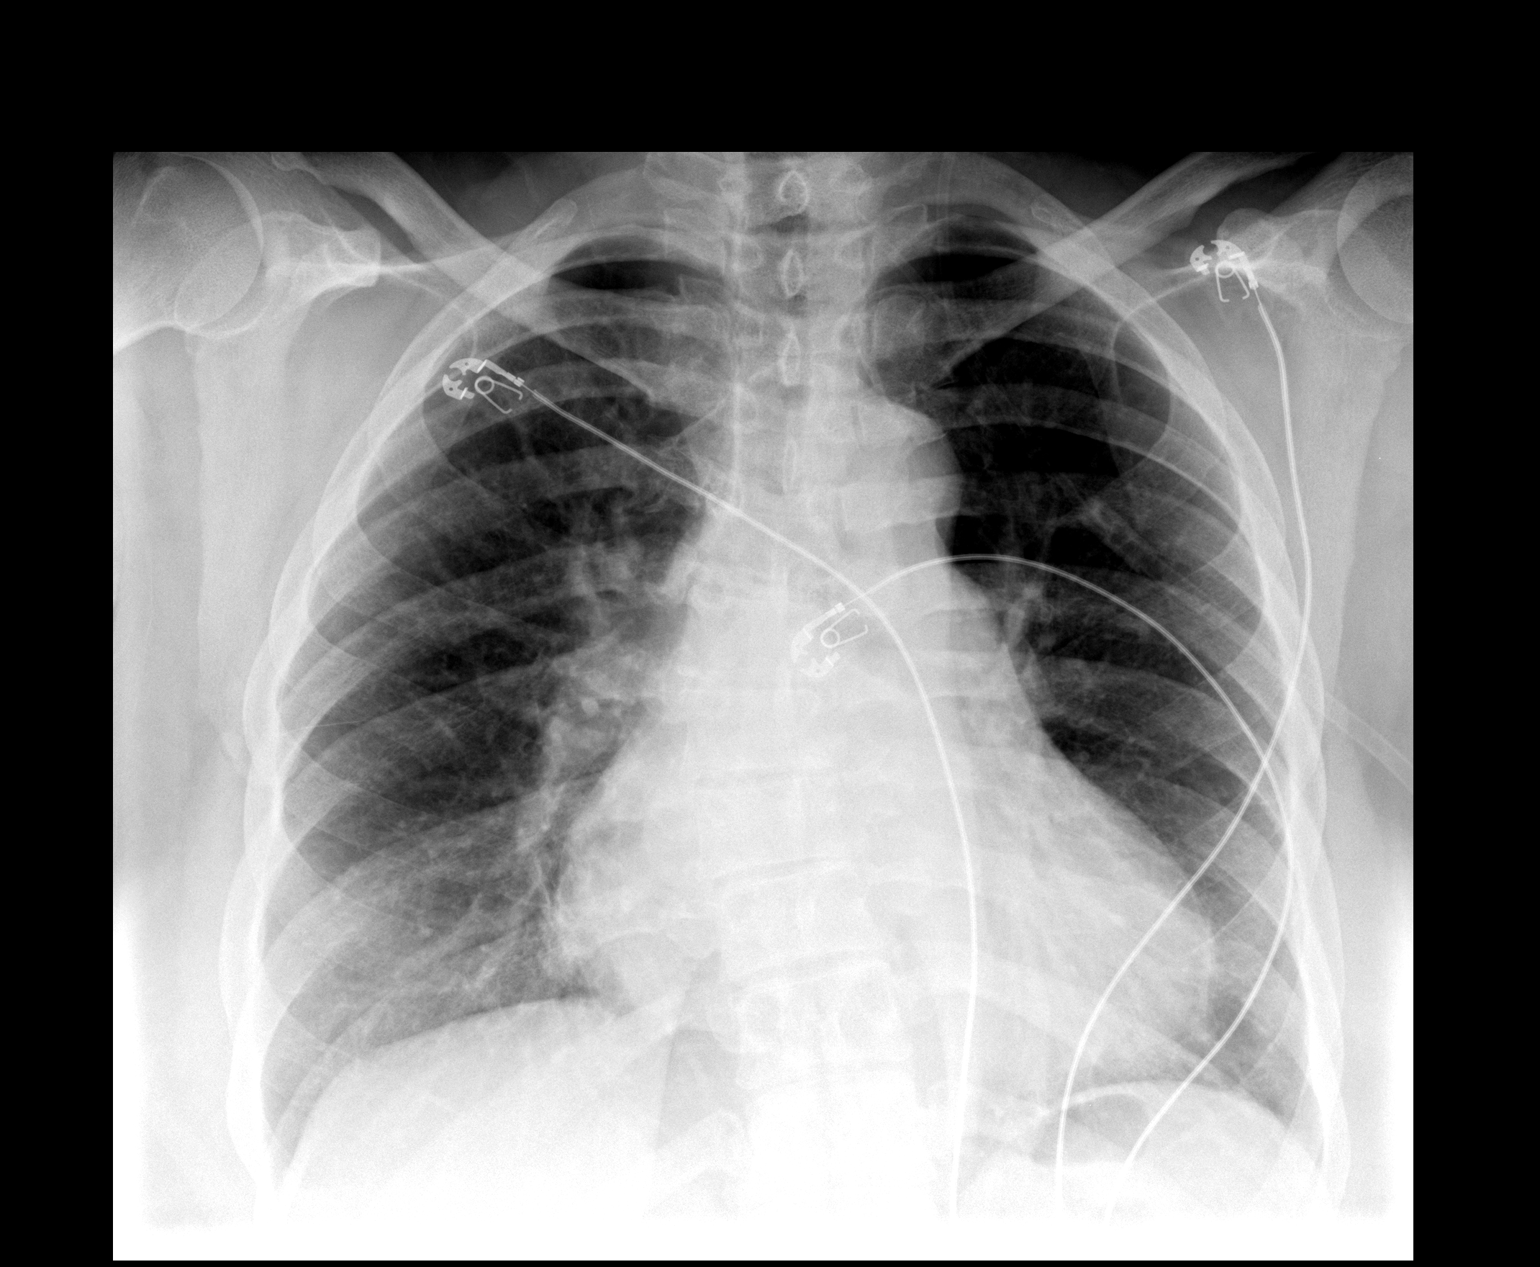

[view not recorded (2 of 2)]
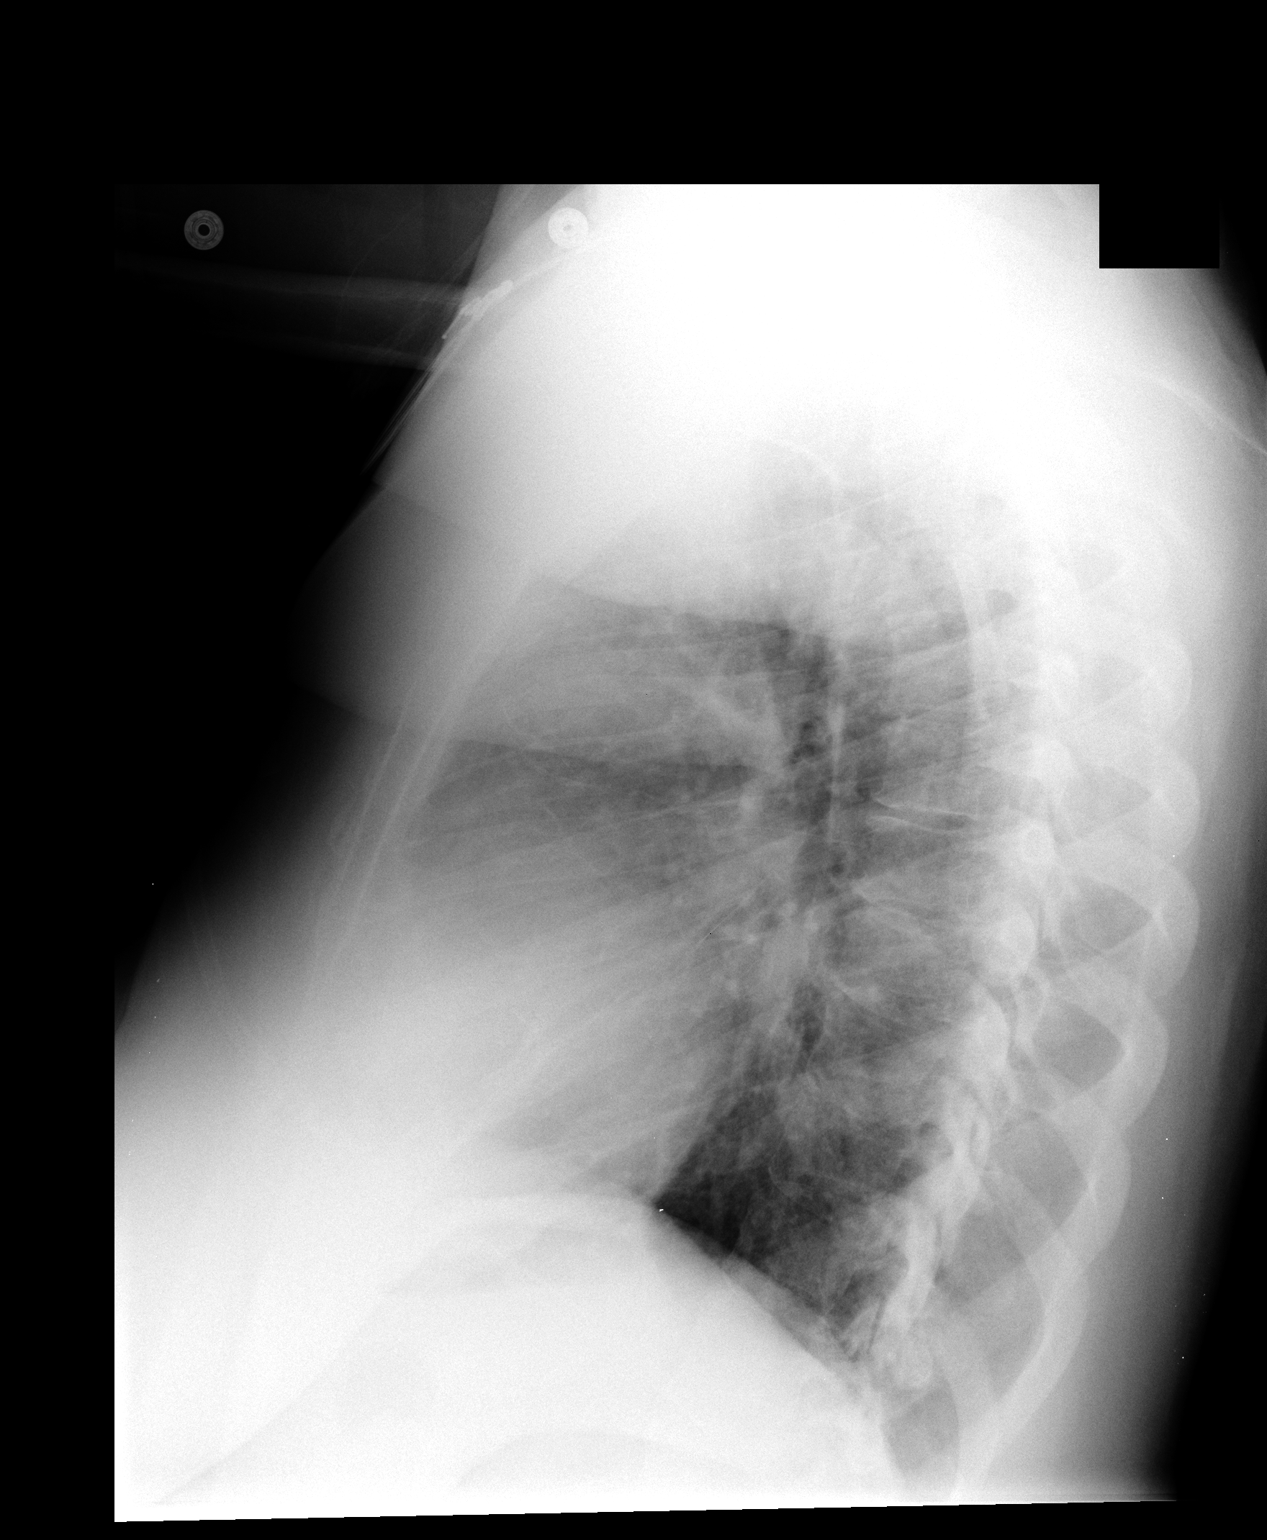

[2 of 2 positions shown; findings below may reference images not displayed]

FINDINGS: Mild cardiac enlargement with normal pulmonary
vascularity.  No focal consolidation or airspace disease.  No
blunting of costophrenic angles.  No pneumothorax.  Mediastinal
contours appear intact. Mild thoracic scoliosis convex towards the
right.  Mild degenerative changes in the thoracic spine.  No
significant changes since the previous study.
IMPRESSION: Mild cardiac enlargement.  No evidence of active pulmonary disease.

## 2015-08-05 ENCOUNTER — Ambulatory Visit (INDEPENDENT_AMBULATORY_CARE_PROVIDER_SITE_OTHER): Payer: Self-pay | Admitting: Cardiology

## 2015-08-05 ENCOUNTER — Encounter: Payer: Self-pay | Admitting: Cardiology

## 2015-08-05 VITALS — BP 178/84 | HR 77 | Ht 67.0 in | Wt 241.0 lb

## 2015-08-05 DIAGNOSIS — I10 Essential (primary) hypertension: Secondary | ICD-10-CM

## 2015-08-05 DIAGNOSIS — I422 Other hypertrophic cardiomyopathy: Secondary | ICD-10-CM

## 2015-08-05 NOTE — Patient Instructions (Signed)
Your physician recommends that you schedule a follow-up appointment in: 1 month with Dr.McDowell    Your physician recommends that you continue on your current medications as directed. Please refer to the Current Medication list given to you today.      Your physician has requested that you have an echocardiogram. Echocardiography is a painless test that uses sound waves to create images of your heart. It provides your doctor with information about the size and shape of your heart and how well your heart's chambers and valves are working. This procedure takes approximately one hour. There are no restrictions for this procedure.       Thank you for choosing [facility] !

## 2015-08-05 NOTE — Progress Notes (Signed)
Cardiology Office Note  Date: 08/05/2015   ID: Traci, Chandler 10-04-1955, MRN [id number]  PCP: [facility]  Primary Cardiologist: Nona Dell, MD   Chief Complaint  Patient presents with  . Heart Murmur  . Hypertension    History of Present Illness: Traci Chandler is a 60 y.o. female last seen in 2014, referred back to the office for evaluation by Traci Chandler. She has a history of hypertrophic cardiomyopathy with long-standing heart murmur. Her last echocardiogram was in 2013 as outlined below. She presents today with elevated blood pressure, states that she has undergone several medication adjustments since I last saw her. In the past she had been on lisinopril, but recalls having difficulties (abdominal pain?) on this medication and it was discontinued. She has also been taken off of hydralazine given concerns about possible relation to hand numbness and discomfort, but not entirely clear that this was directly related. She still has the symptoms and uses a brace on her wrists at nighttime.  She does not report any recurrent chest pain or worsening dyspnea on exertion, NYHA class II at baseline. No palpitations or syncope. I reviewed her ECG done today which shows sinus rhythm with LVH and repolarization abnormalities which are old.  I reviewed her lab work from June 2016 as reviewed below.  We went over her medications in detail today. Current regimen is somewhat unusual. She is on both dihydropyridine and non-dihydropyridine calcium channel blockers, low-dose beta blocker, and HCTZ.  Past Medical History  Diagnosis Date  . Essential hypertension, benign   . Hypertrophic non-obstructive cardiomyopathy (HCC)     Echocardiogram 2011    Past Surgical History  Procedure Laterality Date  . Abdominal hysterectomy    . Colonoscopy N/A 04/15/2013    Procedure: COLONOSCOPY;  Surgeon: West Bali, MD;  Location: [facility];  Service:  Endoscopy;  Laterality: N/A;  9:30    Current Outpatient Prescriptions  Medication Sig Dispense Refill  . amLODipine (NORVASC) 10 MG tablet Take 10 mg by mouth daily.    Marland Kitchen aspirin 81 MG tablet Take 81 mg by mouth every evening.     . hydrochlorothiazide (HYDRODIURIL) 25 MG tablet Take 25 mg by mouth daily.    . metoprolol tartrate (LOPRESSOR) 25 MG tablet Take 25 mg by mouth 2 (two) times daily.    . verapamil (CALAN) 80 MG tablet Take 80 mg by mouth 2 (two) times daily.     No current facility-administered medications for this visit.   Allergies:  Codeine   Social History: The patient  reports that she has never smoked. She has never used smokeless tobacco. She reports that she does not drink alcohol or use illicit drugs.   Family History: The patient's family history includes Diabetes type II in her mother and sister; Hypertension in her mother and sister; Prostate cancer in her father. There is no history of Colon cancer.   ROS:  Please see the history of present illness. Otherwise, complete review of systems is positive for bilateral wrist and hand numbness and discomfort, sounds possibly neuropathic. No orthopnea or PND. All other systems are reviewed and negative.   Physical Exam: VS:  BP 178/84 mmHg  Pulse 77  Ht  (1.702 m)  Wt 241 lb (109.317 kg)  BMI 37.74 kg/m2  SpO2 95%, BMI Body mass index is 37.74 kg/(m^2).  Wt Readings from Last 3 Encounters:  08/05/15 241 lb (109.317 kg)  04/15/13 237 lb (  107.502 kg)  04/02/13 237 lb 9.6 oz (107.775 kg)    General: Obese woman, appears comfortable at rest. HEENT: Conjunctiva and lids normal, oropharynx clear. Neck: Supple, no elevated JVP or carotid bruits, no thyromegaly. Lungs: Clear to auscultation, nonlabored breathing at rest. Cardiac: Regular rate and rhythm, no S3, 2-3/6 systolic murmur, no pericardial rub. Abdomen: Soft, nontender, bowel sounds present, no guarding or rebound. Extremities: Trace edema, distal pulses  2+. Skin: Warm and dry. Musculoskeletal: No kyphosis. Neuropsychiatric: Alert and oriented x3, affect grossly appropriate.  ECG: I personally reviewed the prior tracing from 07/27/2015 which showed sinus bradycardia with prolonged PR interval and increased voltage with repolarization abnormalities.  Recent Labwork:  June 2016: Hemoglobin 14.2, platelets 138, BUN 18, creatinine 1.1, AST 16, ALT 13, cholesterol 148, triglycerides 88, HDL 35, LDL 95  Other Studies Reviewed Today:  Echocardiogram 09/21/2011: Study Conclusions  - Left ventricle: The cavity size was normal. Wall thickness was increased in a pattern of severe LVH. Consistent with a hypertrophic cardiomyopathy. There is chordal SAM without significant LVOT gradient at rest. Systolic function was normal. The estimated ejection fraction was in the range of 60% to 65%. Wall motion was normal; there were no regional wall motion abnormalities. Features are consistent with a pseudonormal left ventricular filling pattern, with concomitant abnormal relaxation and increased filling pressure (grade 2 diastolic dysfunction). - Aortic valve: Trileaflet; mildly thickened leaflets. Mild regurgitation. Mean gradient: 5mm Hg (S). - Mitral valve: Trivial regurgitation. - Left atrium: The atrium was mildly dilated. - Tricuspid valve: Trivial regurgitation.  Assessment and Plan:  1. Long-standing heart murmur with history of hypertrophic cardiomyopathy. No significant change on examination. She has not had an echocardiogram in the last 4 years and this will be arranged for follow-up.  2. History of essential hypertension, not optimally controlled, and difficult to manage over the years. I reviewed her current medications. She is trying to get medications that she can have filled through the Health Department. I am not certain about their active list. My recommendation would be to put her on Aldactone and an angiotensin  receptor blocker if these are available at that facility. This could probably replace the HCTZ and verapamil. Would then turn attention to increasing Lopressor needed, she was on 100 mg of atenolol the past. If she cannot get these medications through the Health Department, we might be able to get this through the $4 program at [facility].  3. Obesity. Weight loss would be beneficial as well. I recommended that she consider a walking regimen. Also low-salt diet.  Current medicines were reviewed with the patient today.   Orders Placed This Encounter  Procedures  . EKG 12-Lead  . Echocardiogram    Disposition: FU with me in 1 month.   Signed, Jonelle Sidle, MD, [facility] 08/05/2015 3:57 PM    [facility] at [facility] [address], [place], [place] [address] Phone: [phone]; Fax: [phone]

## 2015-08-10 ENCOUNTER — Ambulatory Visit (HOSPITAL_COMMUNITY)
Admission: RE | Admit: 2015-08-10 | Discharge: 2015-08-10 | Disposition: A | Discharge status: Home / Self Care | Payer: Self-pay | Source: Ambulatory Visit | Attending: Cardiology | Admitting: Cardiology

## 2015-08-10 DIAGNOSIS — I422 Other hypertrophic cardiomyopathy: Secondary | ICD-10-CM

## 2015-08-10 DIAGNOSIS — I429 Cardiomyopathy, unspecified: Secondary | ICD-10-CM | POA: Insufficient documentation

## 2015-08-10 DIAGNOSIS — I083 Combined rheumatic disorders of mitral, aortic and tricuspid valves: Secondary | ICD-10-CM | POA: Insufficient documentation

## 2015-08-10 DIAGNOSIS — I10 Essential (primary) hypertension: Secondary | ICD-10-CM | POA: Insufficient documentation

## 2015-08-10 DIAGNOSIS — I371 Nonrheumatic pulmonary valve insufficiency: Secondary | ICD-10-CM | POA: Insufficient documentation

## 2015-09-15 ENCOUNTER — Encounter: Payer: Self-pay | Admitting: Cardiology

## 2015-09-15 ENCOUNTER — Ambulatory Visit (INDEPENDENT_AMBULATORY_CARE_PROVIDER_SITE_OTHER): Payer: Self-pay | Admitting: Cardiology

## 2015-09-15 VITALS — BP 138/84 | HR 64 | Ht 67.0 in | Wt 242.0 lb

## 2015-09-15 DIAGNOSIS — I10 Essential (primary) hypertension: Secondary | ICD-10-CM

## 2015-09-15 DIAGNOSIS — I422 Other hypertrophic cardiomyopathy: Secondary | ICD-10-CM

## 2015-09-15 NOTE — Progress Notes (Signed)
Cardiology Office Note  Date: 09/15/2015   ID: Traci Chandler, DOB 06/25/1955, MRN [id number]  [facility]  Primary Cardiologist: Nona DellSamuel McDowell, MD   Chief Complaint  Patient presents with  . Hypertension  . Cardiomyopathy    History of Present Illness: Traci Chandler is a 60 y.o. female last seen in February. She presents for a routine follow-up visit. No changes overall in stamina, no unusual shortness of breath or chest pain. We went over her medications which have been modified since our last visit. Her blood pressure is much better controlled today.  We discussed her echocardiogram which is outlined below. She has had no change in LVEF. Asymmetric septal hypertrophy consistent with hypertrophic cardiomyopathy. She did not have any substantial resting LVOT gradient.  She states that she is trying to cut back on sodas and also fried foods. She has been drinking more water.  Past Medical History  Diagnosis Date  . Essential hypertension, benign   . Hypertrophic non-obstructive cardiomyopathy (HCC)     Echocardiogram 2011    Current Outpatient Prescriptions  Medication Sig Dispense Refill  . aspirin 81 MG tablet Take 81 mg by mouth every evening.     Marland Kitchen. atenolol (TENORMIN) 50 MG tablet Take 50 mg by mouth 2 (two) times daily.    . benazepril (LOTENSIN) 20 MG tablet Take 20 mg by mouth daily.    Marland Kitchen. spironolactone (ALDACTONE) 25 MG tablet Take 25 mg by mouth 2 (two) times daily.     No current facility-administered medications for this visit.   Allergies:  Codeine   Social History: The patient  reports that she has never smoked. She has never used smokeless tobacco. She reports that she does not drink alcohol or use illicit drugs.   ROS:  Please see the history of present illness. Otherwise, complete review of systems is positive for recent sore throat.  All other systems are reviewed and negative.   Physical Exam: VS:  BP 138/84 mmHg  Pulse 64   Ht 5\' 7"  (1.702 m)  Wt 242 lb (109.77 kg)  BMI 37.89 kg/m2  SpO2 95%, BMI Body mass index is 37.89 kg/(m^2).  Wt Readings from Last 3 Encounters:  09/15/15 242 lb (109.77 kg)  08/05/15 241 lb (109.317 kg)  04/15/13 237 lb (107.502 kg)    General: Obese woman, appears comfortable at rest. HEENT: Conjunctiva and lids normal, oropharynx clear. Neck: Supple, no elevated JVP or carotid bruits, no thyromegaly. Lungs: Clear to auscultation, nonlabored breathing at rest. Cardiac: Regular rate and rhythm, no S3, 2-3/6 systolic murmur, no pericardial rub. Abdomen: Soft, nontender, bowel sounds present, no guarding or rebound. Extremities: Trace edema, distal pulses 2+.  ECG: I personally reviewed the prior tracing from 08/05/2015 which showed sinus rhythm with LVH and repolarization abnormalities.  Recent Labwork:  June 2016: Hemoglobin 14.2, platelets 138, BUN 18, creatinine 1.1, AST 16, ALT 13, cholesterol 148, triglycerides 88, HDL 35, LDL 95  Other Studies Reviewed Today:  Echocardiogram 07/21/2015: Study Conclusions  - Left ventricle: The cavity size was normal. Wall thickness was  increased in a pattern of severe LVH. There was severe asymmetric  hypertrophy of the septum. Chordal SAM with turbulent LVOT flow  noted. Systolic function was vigorous. The estimated ejection  fraction was in the range of 65% to 70%. Wall motion was normal;  there were no regional wall motion abnormalities. Doppler  parameters are consistent with abnormal left ventricular  relaxation (grade 1 diastolic dysfunction).  Doppler parameters  are consistent with high ventricular filling pressure. - Aortic valve: There was trivial regurgitation. Mean gradient (S):  6 mm Hg. - Mitral valve: Mildly calcified leaflets . There was trivial  regurgitation. - Left atrium: The atrium was at the upper limits of normal in  size. - Right atrium: Central venous pressure (est): 3 mm Hg. - Tricuspid valve:  There was trivial regurgitation. - Pulmonary arteries: Systolic pressure could not be accurately  estimated. - Pericardium, extracardiac: There was no pericardial effusion.  Impressions:  - Severe asymmetric septal LV hypertrophy with chordal SAM and  turbulent LVOT flow consistent with hypertrophic cardiomyopathy.  LVEF 65-70%. Grade 1 diastolic dysfunction with increased filling  pressures. Limited interrogation of the LVOT does not suggest any  substantial resting gradient. Mean transaortic gradient is 6  mmHg. Trivial aortic, mitral, and tricuspid regurgitation.  Assessment and Plan:  1. Hypertrophic nonobstructive cardiomyopathy with severe asymmetric septal hypertrophy but no major resting LVOT gradient by recent follow-up echocardiogram. LVEF remains normal. Continue medical therapy and observation.  2. Essential hypertension, blood pressure is much better controlled today. She will continue current regimen which includes atenolol, Lotensin, and Aldactone.  Current medicines were reviewed with the patient today.  Disposition: FU with me in 6 months.   Signed, Jonelle Sidle, MD, [facility] 09/15/2015 1:19 PM    [facility] at [facility] [address], [place], [place] [address] Phone: [phone]; Fax: [phone]

## 2015-09-15 NOTE — Patient Instructions (Signed)
Your physician wants you to follow-up in: 6 months with Dr McDowell You will receive a reminder letter in the mail two months in advance. If you don't receive a letter, please call our office to schedule the follow-up appointment.     Your physician recommends that you continue on your current medications as directed. Please refer to the Current Medication list given to you today.    If you need a refill on your cardiac medications before your next appointment, please call your pharmacy.     Thank you for choosing [facility] !        

## 2015-10-22 ENCOUNTER — Emergency Department (HOSPITAL_COMMUNITY)
Admission: EM | Admit: 2015-10-22 | Discharge: 2015-10-22 | Disposition: A | Discharge status: Home / Self Care | Payer: Self-pay | Attending: Emergency Medicine | Admitting: Emergency Medicine

## 2015-10-22 ENCOUNTER — Encounter (HOSPITAL_COMMUNITY): Payer: Self-pay | Admitting: Emergency Medicine

## 2015-10-22 DIAGNOSIS — S46811A Strain of other muscles, fascia and tendons at shoulder and upper arm level, right arm, initial encounter: Secondary | ICD-10-CM | POA: Insufficient documentation

## 2015-10-22 DIAGNOSIS — Y939 Activity, unspecified: Secondary | ICD-10-CM | POA: Insufficient documentation

## 2015-10-22 DIAGNOSIS — R030 Elevated blood-pressure reading, without diagnosis of hypertension: Secondary | ICD-10-CM

## 2015-10-22 DIAGNOSIS — Z7982 Long term (current) use of aspirin: Secondary | ICD-10-CM | POA: Insufficient documentation

## 2015-10-22 DIAGNOSIS — S29012A Strain of muscle and tendon of back wall of thorax, initial encounter: Secondary | ICD-10-CM

## 2015-10-22 DIAGNOSIS — Y929 Unspecified place or not applicable: Secondary | ICD-10-CM | POA: Insufficient documentation

## 2015-10-22 DIAGNOSIS — I10 Essential (primary) hypertension: Secondary | ICD-10-CM | POA: Insufficient documentation

## 2015-10-22 DIAGNOSIS — Y999 Unspecified external cause status: Secondary | ICD-10-CM | POA: Insufficient documentation

## 2015-10-22 DIAGNOSIS — Z79899 Other long term (current) drug therapy: Secondary | ICD-10-CM | POA: Insufficient documentation

## 2015-10-22 DIAGNOSIS — IMO0001 Reserved for inherently not codable concepts without codable children: Secondary | ICD-10-CM

## 2015-10-22 DIAGNOSIS — X58XXXA Exposure to other specified factors, initial encounter: Secondary | ICD-10-CM | POA: Insufficient documentation

## 2015-10-22 MED ORDER — SPIRONOLACTONE 25 MG PO TABS
25.0000 mg | ORAL_TABLET | Freq: Every day | ORAL | Status: DC
Start: 2015-10-22 — End: 2015-10-22
  Administered 2015-10-22: 25 mg via ORAL
  Filled 2015-10-22 (×3): qty 1

## 2015-10-22 MED ORDER — KETOROLAC TROMETHAMINE 60 MG/2ML IM SOLN
60.0000 mg | Freq: Once | INTRAMUSCULAR | Status: AC
  Administered 2015-10-22: 60 mg via INTRAMUSCULAR
  Filled 2015-10-22: qty 2

## 2015-10-22 MED ORDER — BENAZEPRIL HCL 10 MG PO TABS
20.0000 mg | ORAL_TABLET | Freq: Every day | ORAL | Status: DC
  Administered 2015-10-22: 20 mg via ORAL
  Filled 2015-10-22 (×3): qty 2

## 2015-10-22 MED ORDER — METHOCARBAMOL 500 MG PO TABS
1000.0000 mg | ORAL_TABLET | Freq: Once | ORAL | Status: AC
  Administered 2015-10-22: 1000 mg via ORAL
  Filled 2015-10-22: qty 2

## 2015-10-22 MED ORDER — DIAZEPAM 5 MG PO TABS: 5.0000 mg | ORAL_TABLET | Freq: Every evening | ORAL | Status: DC | PRN

## 2015-10-22 MED ORDER — IBUPROFEN 600 MG PO TABS: 600.0000 mg | ORAL_TABLET | Freq: Four times a day (QID) | ORAL | Status: DC | PRN

## 2015-10-22 MED ORDER — METHOCARBAMOL 500 MG PO TABS: 500.0000 mg | ORAL_TABLET | Freq: Three times a day (TID) | ORAL | Status: DC | PRN

## 2015-10-22 MED ORDER — ATENOLOL 25 MG PO TABS
50.0000 mg | ORAL_TABLET | Freq: Once | ORAL | Status: AC
  Administered 2015-10-22: 50 mg via ORAL
  Filled 2015-10-22: qty 2

## 2015-10-22 NOTE — Discharge Instructions (Signed)
Muscle Strain  A muscle strain is an injury that occurs when a muscle is stretched beyond its normal length. Usually a small number of muscle fibers are torn when this happens. Muscle strain is rated in degrees. First-degree strains have the least amount of muscle fiber tearing and pain. Second-degree and third-degree strains have increasingly more tearing and pain.   Usually, recovery from muscle strain takes 1-2 weeks. Complete healing takes 5-6 weeks.   CAUSES   Muscle strain happens when a sudden, violent force placed on a muscle stretches it too far. This may occur with lifting, sports, or a fall.   RISK FACTORS  Muscle strain is especially common in athletes.   SIGNS AND SYMPTOMS  At the site of the muscle strain, there may be:   Pain.   Bruising.   Swelling.   Difficulty using the muscle due to pain or lack of normal function.  DIAGNOSIS   Your health care provider will perform a physical exam and ask about your medical history.  TREATMENT   Often, the best treatment for a muscle strain is resting, icing, and applying cold compresses to the injured area.   HOME CARE INSTRUCTIONS    Use the PRICE method of treatment to promote muscle healing during the first 2-3 days after your injury. The PRICE method involves:    Protecting the muscle from being injured again.    Restricting your activity and resting the injured body part.    Icing your injury. To do this, put ice in a plastic bag. Place a towel between your skin and the bag. Then, apply the ice and leave it on from 15-20 minutes each hour. After the third day, switch to moist heat packs.    Apply compression to the injured area with a splint or elastic bandage. Be careful not to wrap it too tightly. This may interfere with blood circulation or increase swelling.    Elevate the injured body part above the level of your heart as often as you can.   Only take over-the-counter or prescription medicines for pain, discomfort, or fever as directed by your  health care provider.   Warming up prior to exercise helps to prevent future muscle strains.  SEEK MEDICAL CARE IF:    You have increasing pain or swelling in the injured area.   You have numbness, tingling, or a significant loss of strength in the injured area.  MAKE SURE YOU:    Understand these instructions.   Will watch your condition.   Will get help right away if you are not doing well or get worse.     This information is not intended to replace advice given to you by your health care provider. Make sure you discuss any questions you have with your health care provider.     Document Released: 06/04/2005 Document Revised: 03/25/2013 Document Reviewed: 01/01/2013  Elsevier Interactive Patient Education 2016 [employer]      Hypertension  Hypertension, commonly called high blood pressure, is when the force of blood pumping through your arteries is too strong. Your arteries are the blood vessels that carry blood from your heart throughout your body. A blood pressure reading consists of a higher number over a lower number, such as 110/72. The higher number (systolic) is the pressure inside your arteries when your heart pumps. The lower number (diastolic) is the pressure inside your arteries when your heart relaxes. Ideally you want your blood pressure below 120/80.  Hypertension forces your heart to   work harder to pump blood. Your arteries may become narrow or stiff. Having untreated or uncontrolled hypertension can cause heart attack, stroke, kidney disease, and other problems.  RISK FACTORS  Some risk factors for high blood pressure are controllable. Others are not.   Risk factors you cannot control include:    Race. You may be at higher risk if you are African American.   Age. Risk increases with age.   Gender. Men are at higher risk than women before age 45 years. After age 65, women are at higher risk than men.  Risk factors you can control include:   Not getting enough exercise or physical  activity.   Being overweight.   Getting too much fat, sugar, calories, or salt in your diet.   Drinking too much alcohol.  SIGNS AND SYMPTOMS  Hypertension does not usually cause signs or symptoms. Extremely high blood pressure (hypertensive crisis) may cause headache, anxiety, shortness of breath, and nosebleed.  DIAGNOSIS  To check if you have hypertension, your health care provider will measure your blood pressure while you are seated, with your arm held at the level of your heart. It should be measured at least twice using the same arm. Certain conditions can cause a difference in blood pressure between your right and left arms. A blood pressure reading that is higher than normal on one occasion does not mean that you need treatment. If it is not clear whether you have high blood pressure, you may be asked to return on a different day to have your blood pressure checked again. Or, you may be asked to monitor your blood pressure at home for 1 or more weeks.  TREATMENT  Treating high blood pressure includes making lifestyle changes and possibly taking medicine. Living a healthy lifestyle can help lower high blood pressure. You may need to change some of your habits.  Lifestyle changes may include:   Following the DASH diet. This diet is high in fruits, vegetables, and whole grains. It is low in salt, red meat, and added sugars.   Keep your sodium intake below 2,300 mg per day.   Getting at least 30-45 minutes of aerobic exercise at least 4 times per week.   Losing weight if necessary.   Not smoking.   Limiting alcoholic beverages.   Learning ways to reduce stress.  Your health care provider may prescribe medicine if lifestyle changes are not enough to get your blood pressure under control, and if one of the following is true:   You are 18-59 years of age and your systolic blood pressure is above 140.   You are 60 years of age or older, and your systolic blood pressure is above 150.   Your diastolic  blood pressure is above 90.   You have diabetes, and your systolic blood pressure is over 140 or your diastolic blood pressure is over 90.   You have kidney disease and your blood pressure is above 140/90.   You have heart disease and your blood pressure is above 140/90.  Your personal target blood pressure may vary depending on your medical conditions, your age, and other factors.  HOME CARE INSTRUCTIONS   Have your blood pressure rechecked as directed by your health care provider.    Take medicines only as directed by your health care provider. Follow the directions carefully. Blood pressure medicines must be taken as prescribed. The medicine does not work as well when you skip doses. Skipping doses also puts you at risk   for problems.   Do not smoke.    Monitor your blood pressure at home as directed by your health care provider.  SEEK MEDICAL CARE IF:    You think you are having a reaction to medicines taken.   You have recurrent headaches or feel dizzy.   You have swelling in your ankles.   You have trouble with your vision.  SEEK IMMEDIATE MEDICAL CARE IF:   You develop a severe headache or confusion.   You have unusual weakness, numbness, or feel faint.   You have severe chest or abdominal pain.   You vomit repeatedly.   You have trouble breathing.  MAKE SURE YOU:    Understand these instructions.   Will watch your condition.   Will get help right away if you are not doing well or get worse.     This information is not intended to replace advice given to you by your health care provider. Make sure you discuss any questions you have with your health care provider.     Document Released: 06/04/2005 Document Revised: 10/19/2014 Document Reviewed: 03/27/2013  Elsevier Interactive Patient Education 2016 Elsevier Inc[employer]

## 2015-10-22 NOTE — ED Provider Notes (Signed)
CSN: [id number]     Arrival date & time 10/22/15  4098 History   First MD Initiated Contact with Patient 10/22/15 0532     Chief Complaint  Patient presents with  . Back Pain     (Consider location/radiation/quality/duration/timing/severity/associated sxs/prior Treatment) HPI Patient presents with 6 days of gradual onset right-sided thoracic back pain. Pain is worse with movement and palpation. No known injury though patient states she has been trying to work out more. She admits using the elliptical machine last week. Denies any chest pain or shortness of breath. Has taken Tylenol at home with little relief. Patient states she's not had her blood pressure medications this morning. Denies any lightheadedness, visual changes or weakness. Past Medical History  Diagnosis Date  . Essential hypertension, benign   . Hypertrophic non-obstructive cardiomyopathy (HCC)     Echocardiogram 2011   Past Surgical History  Procedure Laterality Date  . Abdominal hysterectomy    . Colonoscopy N/A 04/15/2013    Procedure: COLONOSCOPY;  Surgeon: West Bali, MD;  Location: [facility];  Service: Endoscopy;  Laterality: N/A;  9:30   Family History  Problem Relation Age of Onset  . Diabetes type II Mother   . Diabetes type II Sister   . Hypertension Mother   . Hypertension Sister   . Colon cancer Neg Hx   . Prostate cancer Father    Social History  Substance Use Topics  . Smoking status: Never Smoker   . Smokeless tobacco: Never Used  . Alcohol Use: No   OB History    No data available     Review of Systems  Constitutional: Negative for fever and chills.  Eyes: Negative for visual disturbance.  Respiratory: Negative for cough and shortness of breath.   Cardiovascular: Negative for chest pain, palpitations and leg swelling.  Gastrointestinal: Negative for nausea, vomiting and abdominal pain.  Genitourinary: Negative for dysuria, frequency and flank pain.  Musculoskeletal: Positive for  myalgias and back pain. Negative for arthralgias and neck pain.  Skin: Negative for rash and wound.  Neurological: Negative for dizziness, weakness, light-headedness, numbness and headaches.  All other systems reviewed and are negative.     Allergies  Codeine  Home Medications   Prior to Admission medications   Medication Sig Start Date End Date Taking? Authorizing Provider  aspirin 81 MG tablet Take 81 mg by mouth every evening.    Yes Historical Provider, MD  atenolol (TENORMIN) 50 MG tablet Take 50 mg by mouth 2 (two) times daily.   Yes Historical Provider, MD  benazepril (LOTENSIN) 20 MG tablet Take 20 mg by mouth daily.   Yes Historical Provider, MD  spironolactone (ALDACTONE) 25 MG tablet Take 25 mg by mouth 2 (two) times daily.   Yes Historical Provider, MD  diazepam (VALIUM) 5 MG tablet Take 1 tablet (5 mg total) by mouth at bedtime as needed for muscle spasms. 10/22/15   Loren Racer, MD  ibuprofen (ADVIL,MOTRIN) 600 MG tablet Take 1 tablet (600 mg total) by mouth every 6 (six) hours as needed. 10/22/15   Loren Racer, MD  methocarbamol (ROBAXIN) 500 MG tablet Take 1 tablet (500 mg total) by mouth every 8 (eight) hours as needed for muscle spasms. 10/22/15   Loren Racer, MD   BP 218/74 mmHg  Pulse 60  Temp(Src) 97.9 F (36.6 C) (Oral)  Resp 18  Ht  (1.702 m)  Wt 230 lb (104.327 kg)  BMI 36.01 kg/m2  SpO2 97% Physical Exam  Constitutional:  She is oriented to person, place, and time. She appears well-developed and well-nourished. No distress.  HENT:  Head: Normocephalic and atraumatic.  Mouth/Throat: Oropharynx is clear and moist. No oropharyngeal exudate.  Eyes: EOM are normal. Pupils are equal, round, and reactive to light.  Neck: Normal range of motion. Neck supple.  Cardiovascular: Normal rate and regular rhythm.  Exam reveals no gallop and no friction rub.   No murmur heard. Pulmonary/Chest: Effort normal and breath sounds normal. No respiratory distress.  She has no wheezes. She has no rales. She exhibits no tenderness.  Abdominal: Soft. Bowel sounds are normal. She exhibits no distension and no mass. There is no tenderness. There is no rebound and no guarding.  Musculoskeletal: Normal range of motion. She exhibits tenderness. She exhibits no edema.  Patient has no midline thoracic or lumbar tenderness. She does have tenderness over the rhomboids in the right thoracic region. There is no bony tenderness over the scapula. No lower extremity asymmetry, swelling or tenderness. Distal pulses are equal and intact  Neurological: She is alert and oriented to person, place, and time.  5/5 motor in all extremities. Sensation is intact. Ambulating without any imbalance  Skin: Skin is warm and dry. No rash noted. No erythema.  Psychiatric: She has a normal mood and affect. Her behavior is normal.  Nursing note and vitals reviewed.   ED Course  Procedures (including critical care time) Labs Review Labs Reviewed - No data to display  Imaging Review No results found. I have personally reviewed and evaluated these images and lab results as part of my medical decision-making.   EKG Interpretation None      MDM   Final diagnoses:  Rhomboid muscle strain, initial encounter  Elevated blood pressure    Patient will be given her normal blood pressure medication and will treat with NSAIDs and muscle relaxants. Tenderness is reproduced with palpation of the rhomboids. Suspect muscle strain due to recent exercise regimen.  Blood pressure is improved. She has not taken her blood pressure medication this morning. Advised to follow-up with her primary physician regarding muscle strain and blood pressure management. She's been given return precautions and is voiced understanding.  Loren Raceravid Lonie Rummell, MD 10/22/15 [phone]

## 2015-10-22 NOTE — ED Notes (Signed)
Patient has c/o of right shoulder blade pain x 1 week, no injury that she knows of, has taken Tylenol without any relief.

## 2015-10-31 ENCOUNTER — Telehealth: Payer: Self-pay | Admitting: Cardiology

## 2015-10-31 NOTE — Telephone Encounter (Signed)
Pt has a question about her meds, states she's had a rash come up on her back

## 2015-10-31 NOTE — Telephone Encounter (Signed)
Pt c/o rash which covers half of her back(starting at spine) and goes around to her breast. I advised her to go to her pcp and she agreed

## 2016-03-14 ENCOUNTER — Encounter: Payer: Self-pay | Admitting: Cardiology

## 2016-03-14 ENCOUNTER — Ambulatory Visit (INDEPENDENT_AMBULATORY_CARE_PROVIDER_SITE_OTHER): Payer: Self-pay | Admitting: Cardiology

## 2016-03-14 VITALS — BP 164/90 | HR 61 | Ht 67.0 in | Wt 250.0 lb

## 2016-03-14 DIAGNOSIS — I422 Other hypertrophic cardiomyopathy: Secondary | ICD-10-CM

## 2016-03-14 DIAGNOSIS — I10 Essential (primary) hypertension: Secondary | ICD-10-CM

## 2016-03-14 NOTE — Progress Notes (Signed)
Cardiology Office Note  Date: 03/14/2016   ID: Traci Chandler, DOB 03/16/1956, MRN [id number]  PCP: [facility]  Primary Cardiologist: Nona Dell, MD   Chief Complaint  Patient presents with  . Cardiomyopathy    History of Present Illness: Traci Chandler is a 60 y.o. female last seen in March. She presents for a routine follow-up visit. Does not report any progressive shortness of breath or chest pain. She has had other complaints recently including back pain which has resolved and also a pain in the left lower part of her leg in the region of a tendon.  She does not report any palpitations, dizziness, or syncope. I reviewed her medications which are stable from a cardiac regimen. She continues on atenolol, Lotensin, and Aldactone.  Echocardiogram from earlier in the year is outlined below.  Past Medical History:  Diagnosis Date  . Essential hypertension, benign   . Hypertrophic non-obstructive cardiomyopathy (HCC)    Echocardiogram 2011    Current Outpatient Prescriptions  Medication Sig Dispense Refill  . aspirin 81 MG tablet Take 81 mg by mouth every evening.     Marland Kitchen atenolol (TENORMIN) 50 MG tablet Take 50 mg by mouth 2 (two) times daily.    . benazepril (LOTENSIN) 20 MG tablet Take 20 mg by mouth daily.    . diazepam (VALIUM) 5 MG tablet Take 1 tablet (5 mg total) by mouth at bedtime as needed for muscle spasms. 10 tablet 0  . ibuprofen (ADVIL,MOTRIN) 600 MG tablet Take 1 tablet (600 mg total) by mouth every 6 (six) hours as needed. 30 tablet 0  . methocarbamol (ROBAXIN) 500 MG tablet Take 1 tablet (500 mg total) by mouth every 8 (eight) hours as needed for muscle spasms. 20 tablet 0  . spironolactone (ALDACTONE) 25 MG tablet Take 25 mg by mouth 2 (two) times daily.     No current facility-administered medications for this visit.    Allergies:  Codeine   Social History: The patient  reports that she has never smoked. She has never used smokeless  tobacco. She reports that she does not drink alcohol or use drugs.   ROS:  Please see the history of present illness. Otherwise, complete review of systems is positive for none.  All other systems are reviewed and negative.   Physical Exam: VS:  BP (!) 164/90   Pulse 61   Ht 5\' 7"  (1.702 m)   Wt 250 lb (113.4 kg)   SpO2 98%   BMI 39.16 kg/m , BMI Body mass index is 39.16 kg/m.  Wt Readings from Last 3 Encounters:  03/14/16 250 lb (113.4 kg)  10/22/15 230 lb (104.3 kg)  09/15/15 242 lb (109.8 kg)    General: Obese woman, appears comfortable at rest. HEENT: Conjunctiva and lids normal, oropharynx clear. Neck: Supple, no elevated JVP or carotid bruits, no thyromegaly. Lungs: Clear to auscultation, nonlabored breathing at rest. Cardiac: Regular rate and rhythm, no S3, 1/6 systolic murmur, no pericardial rub. Abdomen: Soft, nontender, bowel sounds present, no guarding or rebound. Extremities: Trace edema, distal pulses 2+.  ECG: I personally reviewed the tracing from 08/05/2015 which showed sinus rhythm with LVH and repolarization abnormalities.  Recent Labwork:  June 2016: Hemoglobin 14.2, platelets 138, BUN 18, creatinine 1.1, AST 16, ALT 13, cholesterol 148, triglycerides 88, HDL 35, LDL 95  Other Studies Reviewed Today:  Echocardiogram 07/21/2015: Study Conclusions  - Left ventricle: The cavity size was normal. Wall thickness was  increased in  a pattern of severe LVH. There was severe asymmetric  hypertrophy of the septum. Chordal SAM with turbulent LVOT flow  noted. Systolic function was vigorous. The estimated ejection  fraction was in the range of 65% to 70%. Wall motion was normal;  there were no regional wall motion abnormalities. Doppler  parameters are consistent with abnormal left ventricular  relaxation (grade 1 diastolic dysfunction). Doppler parameters  are consistent with high ventricular filling pressure. - Aortic valve: There was trivial  regurgitation. Mean gradient (S):  6 mm Hg. - Mitral valve: Mildly calcified leaflets . There was trivial  regurgitation. - Left atrium: The atrium was at the upper limits of normal in  size. - Right atrium: Central venous pressure (est): 3 mm Hg. - Tricuspid valve: There was trivial regurgitation. - Pulmonary arteries: Systolic pressure could not be accurately  estimated. - Pericardium, extracardiac: There was no pericardial effusion.  Impressions:  - Severe asymmetric septal LV hypertrophy with chordal SAM and  turbulent LVOT flow consistent with hypertrophic cardiomyopathy.  LVEF 65-70%. Grade 1 diastolic dysfunction with increased filling  pressures. Limited interrogation of the LVOT does not suggest any  substantial resting gradient. Mean transaortic gradient is 6  mmHg. Trivial aortic, mitral, and tricuspid regurgitation.  Assessment and Plan:  1. Hypertrophic cardiomyopathy without significant degree of obstruction based on most recent echocardiogram as outlined above. Plan to continue medical therapy including beta blocker.  2. Essential hypertension, no change to current regimen. We discussed diet and weight loss.  Current medicines were reviewed with the patient today.  Disposition: Follow-up with me in 6 months.  Signed, Jonelle SidleSamuel G. Nashonda Limberg, MD, [facility] 03/14/2016 1:17 PM    [facility] [address], [place], [place] [address] Phone: [phone]; Fax: [phone]

## 2016-03-14 NOTE — Patient Instructions (Signed)
Your physician wants you to follow-up in: 6 Months with Dr. McDowell.  You will receive a reminder letter in the mail two months in advance. If you don't receive a letter, please call our office to schedule the follow-up appointment.  Your physician recommends that you continue on your current medications as directed. Please refer to the Current Medication list given to you today.  If you need a refill on your cardiac medications before your next appointment, please call your pharmacy.  Thank you for choosing [facility]! '  

## 2016-09-06 NOTE — Progress Notes (Signed)
Cardiology Office Note  Date: 09/10/2016   ID: Traci Chandler, DOB 12/16/1955, MRN [id number]  [facility]  Primary Cardiologist: Nona DellSamuel Nayelie Gionfriddo, MD   Chief Complaint  Patient presents with  . Cardiomyopathy    History of Present Illness: Traci Chandler is a 61 y.o. female last seen in September 2017. She presents for a routine follow-up visit. Reports no change in stamina, no increasing shortness of breath or exertional chest pain. She has had no syncope.   Blood pressure was elevated today. She states recent increased salt intake. We discussed diet, she states that she is compliant with her medications.  Current cardiac regimen includes Tenormin, Lotensin, and Aldactone. She has prior intolerance of lisinopril due to abdominal pain, no history of angioedema or severe ACE inhibitor reaction by her report.  I personally reviewed her ECG today which shows sinus rhythm with prolonged PR interval and increased voltage with repolarization abnormalities.  Last echocardiogram from February 2017 is outlined below.  Past Medical History:  Diagnosis Date  . Essential hypertension, benign   . Hypertrophic non-obstructive cardiomyopathy (HCC)    Echocardiogram 2011    Current Outpatient Prescriptions  Medication Sig Dispense Refill  . aspirin 81 MG tablet Take 81 mg by mouth every evening.     Marland Kitchen. atenolol (TENORMIN) 50 MG tablet Take 50 mg by mouth 2 (two) times daily.    . benazepril (LOTENSIN) 20 MG tablet Take 20 mg by mouth daily.    Marland Kitchen. spironolactone (ALDACTONE) 25 MG tablet Take 25 mg by mouth 2 (two) times daily.     No current facility-administered medications for this visit.    Allergies:  Codeine and Lisinopril   Social History: The patient  reports that she has never smoked. She has never used smokeless tobacco. She reports that she does not drink alcohol or use drugs.   ROS:  Please see the history of present illness. Otherwise, complete review of  systems is positive for none.  All other systems are reviewed and negative.   Physical Exam: VS:  BP (!) 162/100   Pulse 83   Ht 5\' 7"  (1.702 m)   Wt 250 lb (113.4 kg)   SpO2 93%   BMI 39.16 kg/m , BMI Body mass index is 39.16 kg/m.  Wt Readings from Last 3 Encounters:  09/10/16 250 lb (113.4 kg)  03/14/16 250 lb (113.4 kg)  10/22/15 230 lb (104.3 kg)    General: Obese woman, appears comfortable at rest. HEENT: Conjunctiva and lids normal, oropharynx clear. Neck: Supple, no elevated JVP or carotid bruits, no thyromegaly. Lungs: Clear to auscultation, nonlabored breathing at rest. Cardiac: Regular rate and rhythm, no S3, 2/6 systolic murmur, no pericardial rub. Abdomen: Soft, nontender, bowel sounds present, no guarding or rebound. Extremities: Trace edema, distal pulses 2+.  ECG: I personally reviewed the tracing from 08/05/2015 which showed sinus rhythm with LVH and repolarization abnormalities.  Recent Labwork:  June 2016: Hemoglobin 14.2, platelets 138, BUN 18, creatinine 1.1, AST 16, ALT 13, cholesterol 148, triglycerides 88, HDL 35, LDL 95  Other Studies Reviewed Today:  Echocardiogram 07/21/2015: Study Conclusions  - Left ventricle: The cavity size was normal. Wall thickness was  increased in a pattern of severe LVH. There was severe asymmetric  hypertrophy of the septum. Chordal SAM with turbulent LVOT flow  noted. Systolic function was vigorous. The estimated ejection  fraction was in the range of 65% to 70%. Wall motion was normal;  there were no  regional wall motion abnormalities. Doppler  parameters are consistent with abnormal left ventricular  relaxation (grade 1 diastolic dysfunction). Doppler parameters  are consistent with high ventricular filling pressure. - Aortic valve: There was trivial regurgitation. Mean gradient (S):  6 mm Hg. - Mitral valve: Mildly calcified leaflets . There was trivial  regurgitation. - Left atrium: The atrium was at  the upper limits of normal in  size. - Right atrium: Central venous pressure (est): 3 mm Hg. - Tricuspid valve: There was trivial regurgitation. - Pulmonary arteries: Systolic pressure could not be accurately  estimated. - Pericardium, extracardiac: There was no pericardial effusion.  Impressions:  - Severe asymmetric septal LV hypertrophy with chordal SAM and  turbulent LVOT flow consistent with hypertrophic cardiomyopathy.  LVEF 65-70%. Grade 1 diastolic dysfunction with increased filling  pressures. Limited interrogation of the LVOT does not suggest any  substantial resting gradient. Mean transaortic gradient is 6  mmHg. Trivial aortic, mitral, and tricuspid regurgitation.  Assessment and Plan:  1. Hypertrophic cardiomyopathy, LVEF from last year did not suggest any significant LVOT gradient and she has been symptomatically stable on medical therapy. Blood pressure was elevated today, but she reports recent indiscretion with salt. We addressed diet and will  plan to continue medical therapy and routine follow-up.  2. Essential hypertension, continue current regimen. Discussed sodium restriction and weight loss as well.  Current medicines were reviewed with the patient today.   Orders Placed This Encounter  Procedures  . EKG 12-Lead    Disposition: Follow-up in 6 months.  Signed, Jonelle Sidle, MD, [facility] 09/10/2016 9:08 AM    [facility] at [facility] [address], [place], [place] [address] Phone: [phone]; Fax: [phone]

## 2016-09-10 ENCOUNTER — Ambulatory Visit (INDEPENDENT_AMBULATORY_CARE_PROVIDER_SITE_OTHER): Payer: BLUE CROSS/BLUE SHIELD | Admitting: Cardiology

## 2016-09-10 ENCOUNTER — Encounter: Payer: Self-pay | Admitting: Cardiology

## 2016-09-10 VITALS — BP 162/90 | HR 83 | Ht 67.0 in | Wt 250.0 lb

## 2016-09-10 DIAGNOSIS — I422 Other hypertrophic cardiomyopathy: Secondary | ICD-10-CM

## 2016-09-10 DIAGNOSIS — I10 Essential (primary) hypertension: Secondary | ICD-10-CM

## 2016-09-10 MED ORDER — ATENOLOL 50 MG PO TABS
50.0000 mg | ORAL_TABLET | Freq: Two times a day (BID) | ORAL | 3 refills | Status: DC
Start: 2016-09-10 — End: 2018-01-08

## 2016-09-10 NOTE — Patient Instructions (Signed)
Your physician wants you to follow-up in: 6 months Dr McDowell You will receive a reminder letter in the mail two months in advance. If you don't receive a letter, please call our office to schedule the follow-up appointment.  Your physician recommends that you continue on your current medications as directed. Please refer to the Current Medication list given to you today.    If you need a refill on your cardiac medications before your next appointment, please call your pharmacy.       Thank you for choosing [facility] !         

## 2018-01-08 ENCOUNTER — Emergency Department (HOSPITAL_COMMUNITY): Payer: BLUE CROSS/BLUE SHIELD

## 2018-01-08 ENCOUNTER — Emergency Department (HOSPITAL_COMMUNITY)
Admission: EM | Admit: 2018-01-08 | Discharge: 2018-01-08 | Disposition: A | Discharge status: Home / Self Care | Payer: BLUE CROSS/BLUE SHIELD | Attending: Emergency Medicine | Admitting: Emergency Medicine

## 2018-01-08 ENCOUNTER — Encounter (HOSPITAL_COMMUNITY): Payer: Self-pay | Admitting: Emergency Medicine

## 2018-01-08 ENCOUNTER — Other Ambulatory Visit: Payer: Self-pay

## 2018-01-08 DIAGNOSIS — I422 Other hypertrophic cardiomyopathy: Secondary | ICD-10-CM | POA: Insufficient documentation

## 2018-01-08 DIAGNOSIS — R0789 Other chest pain: Secondary | ICD-10-CM | POA: Diagnosis not present

## 2018-01-08 DIAGNOSIS — Z79899 Other long term (current) drug therapy: Secondary | ICD-10-CM | POA: Insufficient documentation

## 2018-01-08 DIAGNOSIS — K2900 Acute gastritis without bleeding: Secondary | ICD-10-CM | POA: Diagnosis not present

## 2018-01-08 DIAGNOSIS — Z7982 Long term (current) use of aspirin: Secondary | ICD-10-CM | POA: Insufficient documentation

## 2018-01-08 DIAGNOSIS — R079 Chest pain, unspecified: Secondary | ICD-10-CM | POA: Diagnosis present

## 2018-01-08 DIAGNOSIS — I10 Essential (primary) hypertension: Secondary | ICD-10-CM | POA: Insufficient documentation

## 2018-01-08 LAB — LIPASE, BLOOD: Lipase: 40 U/L (ref 11–51)

## 2018-01-08 LAB — HEPATIC FUNCTION PANEL
ALT: 21 U/L (ref 0–44)
AST: 26 U/L (ref 15–41)
Albumin: 4.4 g/dL (ref 3.5–5.0)
Alkaline Phosphatase: 52 U/L (ref 38–126)
BILIRUBIN DIRECT: 0.1 mg/dL (ref 0.0–0.2)
BILIRUBIN TOTAL: 0.6 mg/dL (ref 0.3–1.2)
Indirect Bilirubin: 0.5 mg/dL (ref 0.3–0.9)
Total Protein: 8.1 g/dL (ref 6.5–8.1)

## 2018-01-08 LAB — I-STAT TROPONIN, ED
TROPONIN I, POC: 0.01 ng/mL (ref 0.00–0.08)
TROPONIN I, POC: 0.01 ng/mL (ref 0.00–0.08)

## 2018-01-08 LAB — BASIC METABOLIC PANEL
ANION GAP: 6 (ref 5–15)
BUN: 34 mg/dL — AB (ref 8–23)
CALCIUM: 9.9 mg/dL (ref 8.9–10.3)
CO2: 28 mmol/L (ref 22–32)
Chloride: 102 mmol/L (ref 98–111)
Creatinine, Ser: 1.6 mg/dL — ABNORMAL HIGH (ref 0.44–1.00)
GFR calc Af Amer: 38 mL/min — ABNORMAL LOW (ref >60)
GFR, EST NON AFRICAN AMERICAN: 33 mL/min — AB (ref >60)
Glucose, Bld: 101 mg/dL — ABNORMAL HIGH (ref 70–99)
Potassium: 5 mmol/L (ref 3.5–5.1)
Sodium: 136 mmol/L (ref 135–145)

## 2018-01-08 LAB — CBC
HCT: 44.1 % (ref 36.0–46.0)
Hemoglobin: 14.4 g/dL (ref 12.0–15.0)
MCH: 29.8 pg (ref 26.0–34.0)
MCHC: 32.7 g/dL (ref 30.0–36.0)
MCV: 91.1 fL (ref 78.0–100.0)
PLATELETS: 126 10*3/uL — AB (ref 150–400)
RBC: 4.84 MIL/uL (ref 3.87–5.11)
RDW: 12.4 % (ref 11.5–15.5)
WBC: 4 10*3/uL (ref 4.0–10.5)

## 2018-01-08 MED ORDER — ONDANSETRON HCL 4 MG PO TABS: 4.0000 mg | ORAL_TABLET | Freq: Four times a day (QID) | ORAL | 0 refills | Status: DC | PRN

## 2018-01-08 MED ORDER — SUCRALFATE 1 G PO TABS: 1.0000 g | ORAL_TABLET | Freq: Three times a day (TID) | ORAL | 0 refills | Status: DC

## 2018-01-08 MED ORDER — ASPIRIN 81 MG PO CHEW
162.0000 mg | CHEWABLE_TABLET | Freq: Once | ORAL | Status: AC
  Administered 2018-01-08: 162 mg via ORAL
  Filled 2018-01-08: qty 2

## 2018-01-08 MED ORDER — OMEPRAZOLE 20 MG PO CPDR: 40.0000 mg | DELAYED_RELEASE_CAPSULE | Freq: Every day | ORAL | 0 refills | Status: DC

## 2018-01-08 MED ORDER — NITROGLYCERIN 0.4 MG SL SUBL
0.4000 mg | SUBLINGUAL_TABLET | SUBLINGUAL | Status: DC | PRN
  Administered 2018-01-08: 0.4 mg via SUBLINGUAL
  Filled 2018-01-08: qty 1

## 2018-01-08 MED ORDER — ONDANSETRON 4 MG PO TBDP
4.0000 mg | ORAL_TABLET | Freq: Once | ORAL | Status: AC
  Administered 2018-01-08: 4 mg via ORAL
  Filled 2018-01-08: qty 1

## 2018-01-08 MED ORDER — SUCRALFATE 1 GM/10ML PO SUSP
1.0000 g | Freq: Three times a day (TID) | ORAL | Status: DC
  Administered 2018-01-08: 1 g via ORAL
  Filled 2018-01-08: qty 10

## 2018-01-08 MED ORDER — GI COCKTAIL ~~LOC~~
30.0000 mL | Freq: Once | ORAL | Status: AC
  Administered 2018-01-08: 30 mL via ORAL
  Filled 2018-01-08: qty 30

## 2018-01-08 NOTE — ED Provider Notes (Signed)
[facility] EMERGENCY DEPARTMENT Provider Note   CSN: [id number] Arrival date & time: 01/08/18  0940     History   Chief Complaint Chief Complaint  Patient presents with  . Chest Pain    HPI Traci Chandler is a 62 y.o. female.  HPI Patient presents with lower central chest pressure that started yesterday evening.  States the pain is been constant.  She denies any shortness of breath.  Denies nausea or prior treatment before coming to the emergency department.  No new lower extremity swelling or pain.  Patient states she is had sisters with MIs in their mid 45s.  Does not smoke cigarettes.  No recent extended travel.  Patient did have her metoprolol switched to losartan 1 week ago.  Patient states she had one episode of chest pressure 4 days ago which was associated with shortness of breath.  It resolved spontaneously. Past Medical History:  Diagnosis Date  . Essential hypertension, benign   . Hypertrophic non-obstructive cardiomyopathy [facility])    Echocardiogram 2011    Patient Active Problem List   Diagnosis Date Noted  . Encounter for screening colonoscopy 04/02/2013  . Hypertrophic cardiomyopathy (HCC) 09/12/2011  . Essential hypertension, benign 09/12/2011    Past Surgical History:  Procedure Laterality Date  . ABDOMINAL HYSTERECTOMY    . COLONOSCOPY N/A 04/15/2013   Procedure: COLONOSCOPY;  Surgeon: West Bali, MD;  Location: [facility];  Service: Endoscopy;  Laterality: N/A;  9:30     OB History   None      Home Medications    Prior to Admission medications   Medication Sig Start Date End Date Taking? Authorizing Provider  aspirin 81 MG tablet Take 81 mg by mouth every evening.    Yes [provider]  benazepril (LOTENSIN) 40 MG tablet Take 40 mg by mouth daily.    Yes [provider]  labetalol (NORMODYNE) 100 MG tablet Take 100 mg by mouth 2 (two) times daily.   Yes [provider]  omega-3 acid ethyl esters (LOVAZA) 1 g  capsule Take 1 g by mouth 2 (two) times daily.   Yes [provider]  spironolactone (ALDACTONE) 25 MG tablet Take 25 mg by mouth 2 (two) times daily.   Yes [provider]  omeprazole (PRILOSEC) 20 MG capsule Take 2 capsules (40 mg total) by mouth daily. 01/08/18   Loren Racer, MD  ondansetron (ZOFRAN) 4 MG tablet Take 1 tablet (4 mg total) by mouth every 6 (six) hours as needed for nausea or vomiting. 01/08/18   Loren Racer, MD  sucralfate (CARAFATE) 1 g tablet Take 1 tablet (1 g total) by mouth 4 (four) times daily -  with meals and at bedtime. 01/08/18   Loren Racer, MD    Family History Family History  Problem Relation Age of Onset  . Diabetes type II Mother   . Hypertension Mother   . Diabetes type II Sister   . Hypertension Sister   . Prostate cancer Father   . Colon cancer Neg Hx     Social History Social History   Tobacco Use  . Smoking status: Never Smoker  . Smokeless tobacco: Never Used  Substance Use Topics  . Alcohol use: No  . Drug use: No     Allergies   Codeine; Diclofenac; and Lisinopril   Review of Systems Review of Systems  Constitutional: Negative for chills and fever.  HENT: Negative for trouble swallowing.   Eyes: Negative for visual disturbance.  Respiratory: Negative for cough and shortness of breath.   Cardiovascular: Positive for chest pain. Negative for palpitations and leg swelling.  Gastrointestinal: Positive for abdominal pain. Negative for constipation, diarrhea, nausea and vomiting.  Genitourinary: Negative for dysuria, flank pain, frequency and hematuria.  Musculoskeletal: Negative for back pain, myalgias, neck pain and neck stiffness.  Skin: Negative for rash and wound.  Neurological: Negative for dizziness, weakness, light-headedness, numbness and headaches.  All other systems reviewed and are negative.    Physical Exam Updated Vital Signs BP (!) 199/84   Pulse (!) 59   Temp 98 F (36.7 C) (Oral)    Resp 15   Ht 5\' 7"  (1.702 m)   Wt 113.4 kg (250 lb)   SpO2 100%   BMI 39.16 kg/m   Physical Exam  Constitutional: She is oriented to person, place, and time. She appears well-developed and well-nourished. No distress.  HENT:  Head: Normocephalic and atraumatic.  Mouth/Throat: Oropharynx is clear and moist. No oropharyngeal exudate.  Eyes: Pupils are equal, round, and reactive to light. EOM are normal.  Neck: Normal range of motion. Neck supple. No JVD present.  Cardiovascular: Normal rate and regular rhythm. Exam reveals no gallop and no friction rub.  No murmur heard. Pulmonary/Chest: Effort normal and breath sounds normal. No stridor. No respiratory distress. She has no wheezes. She has no rales. She exhibits tenderness.  Tenderness to palpation over the inferior sternum.  No crepitance or deformity.  Abdominal: Soft. Bowel sounds are normal. There is tenderness. There is no rebound and no guarding.  Patient has epigastric and left upper quadrant tenderness with palpation.  No rebound or guarding.  Musculoskeletal: Normal range of motion. She exhibits no edema or tenderness.  No midline thoracic or lumbar tenderness.  No CVA tenderness.  No lower extremity swelling, asymmetry or tenderness.  Distal pulses are 2+.  Lymphadenopathy:    She has no cervical adenopathy.  Neurological: She is alert and oriented to person, place, and time.  Moves all extremities without focal deficit.  Sensation intact.  Skin: Skin is warm and dry. Capillary refill takes less than 2 seconds. No rash noted. She is not diaphoretic. No erythema.  Psychiatric: She has a normal mood and affect. Her behavior is normal.  Nursing note and vitals reviewed.    ED Treatments / Results  Labs (all labs ordered are listed, but only abnormal results are displayed) Labs Reviewed  BASIC METABOLIC PANEL - Abnormal; Notable for the following components:      Result Value   Glucose, Bld 101 (*)    BUN 34 (*)     Creatinine, Ser 1.60 (*)    GFR calc non Af Amer 33 (*)    GFR calc Af Amer 38 (*)    All other components within normal limits  CBC - Abnormal; Notable for the following components:   Platelets 126 (*)    All other components within normal limits  LIPASE, BLOOD  HEPATIC FUNCTION PANEL  I-STAT TROPONIN, ED  I-STAT TROPONIN, ED    EKG EKG Interpretation  Date/Time:  Wednesday January 08 2018 09:53:00 EDT Ventricular Rate:  68 PR Interval:  234 QRS Duration: 120 QT Interval:  420 QTC Calculation: 446 R Axis:   -24 Text Interpretation:  Sinus rhythm with 1st degree A-V block Non-specific intra-ventricular conduction delay T wave abnormality, consider lateral ischemia Abnormal ECG Confirmed by Loren Racer ([id number]) on 01/08/2018 10:48:20 AM   Radiology Dg Chest 2 View  Result Date: 01/08/2018 CLINICAL  DATA:  Onset of chest pain last night. History of hypertension and hypertrophic cardiomyopathy. Nonsmoker. EXAM: CHEST - 2 VIEW COMPARISON:  PA and lateral chest x-ray of January 12, 2013 FINDINGS: The lungs are adequately inflated. There is no focal infiltrate. There is no pleural effusion. The heart and pulmonary vascularity are normal. The mediastinum is normal in width. The trachea is midline. The bony thorax exhibits no acute abnormality. There is mild dextrocurvature centered in the mid to lower thoracic spine. IMPRESSION: There is no active cardiopulmonary disease. Electronically Signed   By: Breia Ocampo  [place] M.D.   On: 01/08/2018 11:28    Procedures Procedures (including critical care time)  Medications Ordered in ED Medications  aspirin chewable tablet 162 mg (162 mg Oral Given 01/08/18 1058)  ondansetron (ZOFRAN-ODT) disintegrating tablet 4 mg (4 mg Oral Given 01/08/18 1138)  gi cocktail (Maalox,Lidocaine,Donnatal) (30 mLs Oral Given 01/08/18 1140)     Initial Impression / Assessment and Plan / ED Course  I have reviewed the triage vital signs and the nursing notes.  Pertinent  labs & imaging results that were available during my care of the patient were reviewed by me and considered in my medical decision making (see chart for details).     Troponin x2 is normal.  EKG without evidence of ischemia.  Unchanged from previous EKGs.  Patient continues to have a lot of belching while in the emergency department.  Had one episode of vomiting after receiving GI cocktail.  Her symptoms have significantly improved after Carafate.  Low suspicion that this is process related to coronary artery disease.  Suspect gastritis with reflux.  Will increase Protonix to 40 mg daily and add Carafate.  Given dietary education.  Patient encouraged to follow-up with gastroenterology should her symptoms persist.  She is been given return precautions and is voiced understanding.  Final Clinical Impressions(s) / ED Diagnoses   Final diagnoses:  Atypical chest pain  Acute gastritis, presence of bleeding unspecified, unspecified gastritis type    ED Discharge Orders        Ordered    omeprazole (PRILOSEC) 20 MG capsule  Daily,   Status:  Discontinued     01/08/18 1450    ondansetron (ZOFRAN) 4 MG tablet  Every 6 hours PRN,   Status:  Discontinued     01/08/18 1450    sucralfate (CARAFATE) 1 g tablet  3 times daily with meals & bedtime,   Status:  Discontinued     01/08/18 1450    omeprazole (PRILOSEC) 20 MG capsule  Daily     01/08/18 1451    ondansetron (ZOFRAN) 4 MG tablet  Every 6 hours PRN     01/08/18 1451    sucralfate (CARAFATE) 1 g tablet  3 times daily with meals & bedtime     01/08/18 1451       Loren RacerYelverton, Lynnie Koehler, MD 01/09/18 [phone]

## 2018-01-08 NOTE — ED Notes (Addendum)
Pt became nauseated since SL NTG given, pt with belching as well

## 2018-01-08 NOTE — ED Notes (Signed)
Pt refused second SL NTG at this time

## 2018-01-08 NOTE — ED Notes (Signed)
Patient transported to X-ray 

## 2018-01-08 NOTE — ED Notes (Signed)
Nausea has decreased since belching

## 2018-01-08 NOTE — ED Triage Notes (Signed)
Pt states new medication change to losartan from metoprolol. Pt states med changed was last Tuesday but symptoms began Saturday. SOB began slightly Saturday night.

## 2018-01-08 NOTE — ED Notes (Signed)
[facility], site wnl, bandaid applied

## 2018-01-14 ENCOUNTER — Ambulatory Visit: Payer: BLUE CROSS/BLUE SHIELD | Admitting: Nurse Practitioner

## 2018-01-14 ENCOUNTER — Encounter: Payer: Self-pay | Admitting: Nurse Practitioner

## 2018-01-14 DIAGNOSIS — R531 Weakness: Secondary | ICD-10-CM | POA: Diagnosis not present

## 2018-01-14 DIAGNOSIS — R1010 Upper abdominal pain, unspecified: Secondary | ICD-10-CM | POA: Diagnosis not present

## 2018-01-14 DIAGNOSIS — R109 Unspecified abdominal pain: Secondary | ICD-10-CM | POA: Insufficient documentation

## 2018-01-14 DIAGNOSIS — K219 Gastro-esophageal reflux disease without esophagitis: Secondary | ICD-10-CM | POA: Diagnosis not present

## 2018-01-14 MED ORDER — OMEPRAZOLE 20 MG PO CPDR: 20.0000 mg | DELAYED_RELEASE_CAPSULE | Freq: Two times a day (BID) | ORAL | 3 refills | Status: DC

## 2018-01-14 MED ORDER — OMEPRAZOLE 20 MG PO CPDR: 40.0000 mg | DELAYED_RELEASE_CAPSULE | Freq: Two times a day (BID) | ORAL | 3 refills | Status: DC

## 2018-01-14 NOTE — Patient Instructions (Addendum)
1. I have changed her prescription for Prilosec to 20 mg twice a day, on an empty stomach. 2. He can take Carafate up to 4 times a day, if needed for breakthrough heartburn symptoms. 3. Further information related to foods to avoid with heartburn is printed below. 4. Follow-up with your primary care related to your blood pressure medications. 5. Return for follow-up here in 3 months. 6. Call us if you have any questions or concerns before then.  At [facility] we value your feedback. You may receive a survey about your visit today. Please share your experience as we strive to create trusting relationships with our patients to provide genuine, compassionate, quality care.  It was great to meet you today!  I hope you have a great summer!!      Food Choices for Gastroesophageal Reflux Disease, Adult When you have gastroesophageal reflux disease (GERD), the foods you eat and your eating habits are very important. Choosing the right foods can help ease the discomfort of GERD. Consider working with a diet and nutrition specialist (dietitian) to help you make healthy food choices. What general guidelines should I follow? Eating plan  Choose healthy foods low in fat, such as fruits, vegetables, whole grains, low-fat dairy products, and lean meat, fish, and poultry.  Eat frequent, small meals instead of three large meals each day. Eat your meals slowly, in a relaxed setting. Avoid bending over or lying down until 2-3 hours after eating.  Limit high-fat foods such as fatty meats or fried foods.  Limit your intake of oils, butter, and shortening to less than 8 teaspoons each day.  Avoid the following: ? Foods that cause symptoms. These may be different for different people. Keep a food diary to keep track of foods that cause symptoms. ? Alcohol. ? Drinking large amounts of liquid with meals. ? Eating meals during the 2-3 hours before bed.  Cook foods using methods other than  frying. This may include baking, grilling, or broiling. Lifestyle   Maintain a healthy weight. Ask your health care provider what weight is healthy for you. If you need to lose weight, work with your health care provider to do so safely.  Exercise for at least 30 minutes on 5 or more days each week, or as told by your health care provider.  Avoid wearing clothes that fit tightly around your waist and chest.  Do not use any products that contain nicotine or tobacco, such as cigarettes and e-cigarettes. If you need help quitting, ask your health care provider.  Sleep with the head of your bed raised. Use a wedge under the mattress or blocks under the bed frame to raise the head of the bed. What foods are not recommended? The items listed may not be a complete list. Talk with your dietitian about what dietary choices are best for you. Grains Pastries or quick breads with added fat. [place] toast. Vegetables Deep fried vegetables. [place] fries. Any vegetables prepared with added fat. Any vegetables that cause symptoms. For some people this may include tomatoes and tomato products, chili peppers, onions and garlic, and horseradish. Fruits Any fruits prepared with added fat. Any fruits that cause symptoms. For some people this may include citrus fruits, such as oranges, grapefruit, pineapple, and lemons. Meats and other protein foods High-fat meats, such as fatty beef or pork, hot dogs, ribs, ham, sausage, salami and bacon. Fried meat or protein, including fried fish and fried chicken. Nuts and nut butters. Dairy Whole milk and chocolate  milk. Sour cream. Cream. Ice cream. Cream cheese. Milk shakes. Beverages Coffee and tea, with or without caffeine. Carbonated beverages. Sodas. Energy drinks. Fruit juice made with acidic fruits (such as orange or grapefruit). Tomato juice. Alcoholic drinks. Fats and oils Butter. Margarine. Shortening. Ghee. Sweets and desserts Chocolate and cocoa.  Donuts. Seasoning and other foods Pepper. Peppermint and spearmint. Any condiments, herbs, or seasonings that cause symptoms. For some people, this may include curry, hot sauce, or vinegar-based salad dressings. Summary  When you have gastroesophageal reflux disease (GERD), food and lifestyle choices are very important to help ease the discomfort of GERD.  Eat frequent, small meals instead of three large meals each day. Eat your meals slowly, in a relaxed setting. Avoid bending over or lying down until 2-3 hours after eating.  Limit high-fat foods such as fatty meat or fried foods. This information is not intended to replace advice given to you by your health care provider. Make sure you discuss any questions you have with your health care provider. Document Released: 06/04/2005 Document Revised: 06/05/2016 Document Reviewed: 06/05/2016 Elsevier Interactive Patient Education  [employer]2018 [employer] Inc[employer]

## 2018-01-14 NOTE — Addendum Note (Signed)
Addended by: Delane GingerGILL, Lanyah Spengler A on: 01/14/2018 12:11 PM   Modules accepted: Orders

## 2018-01-14 NOTE — Progress Notes (Signed)
Primary Care Physician:  [facility], [facility] Primary Gastroenterologist:  Dr. Darrick Penna  Chief Complaint  Patient presents with  . Gastroesophageal Reflux    Ref for Gastritis by ER.  Seen last Wednesday. EKG was normal. Pt though she had chest pain    HPI:   Traci Chandler is a 62 y.o. female who presents on referral from the emergency department for gastritis.  Patient has not been seen in our office since 04/02/2013 when she was seen for encounter for screening colonoscopy.  Occasional constipation, Prilosec really, no dysphasia.  Recommended proceed with colonoscopy.  Colonoscopy completed 04/15/2013 which found terminal terminal ileum, mild diverticulosis in the sigmoid colon, moderate size internal hemorrhoids.  Recommended repeat exam in 10 years (2024).  She was seen in the emergency department 01/08/2018 for atypical chest pain which started the previous evening.  No nausea.  Family history of MI and her sisters in their mid 39s.  On exam the noted left upper quadrant and epigastric tenderness.  CMP essentially normal other than elevated creatinine 1.60.  Her platelets are mildly depressed at 126.  Lipase, hepatic function panel normal.  Prominence were normal, EKG without evidence of ischemia.  She was noted to have significant belching.  Symptoms improved significantly after Carafate.  Noted low suspicion for coronary artery disease and suspect gastritis with reflux.  Her Prilosec was increased to 40 mg daily and Carafate added, dietary education given, recommended follow-up with GI.  Today she states she's doing ok overall. Her increased dose of Prilosec is causing her to have diarrhea. She feels this did help her GERD/gastritis symptoms overall. Carafate helped "I guess" but states she hasn't taken any medicine since Sunday because she's been so weak. Her BP is usually in the 200s and today it is 120 systolic. She has notified her PCP and they altered her BP  medications. She has some mid to upper abdominal pain, which is improved and states "not hurting like PAIN pain...". Denies N/V since ER, hematochezia, melena, fever, chills, unintentional weight loss. Denies chest pain, dyspnea, dizziness, lightheadedness, syncope, near syncope. Denies any other upper or lower GI symptoms.  Past Medical History:  Diagnosis Date  . Essential hypertension, benign   . GERD (gastroesophageal reflux disease)   . Hypertrophic non-obstructive cardiomyopathy (HCC)    Echocardiogram 2011    Past Surgical History:  Procedure Laterality Date  . ABDOMINAL HYSTERECTOMY    . COLONOSCOPY N/A 04/15/2013   Procedure: COLONOSCOPY;  Surgeon: West Bali, MD;  [facility];  Service: Endoscopy;  Laterality: N/A;  9:30    Current Outpatient Medications  Medication Sig Dispense Refill  . aspirin 81 MG tablet Take 81 mg by mouth every evening.     . benazepril (LOTENSIN) 40 MG tablet Take 40 mg by mouth daily.     Marland Kitchen omega-3 acid ethyl esters (LOVAZA) 1 g capsule Take 1 g by mouth 2 (two) times daily.    Marland Kitchen omeprazole (PRILOSEC) 20 MG capsule Take 2 capsules (40 mg total) by mouth daily. 30 capsule 0  . ondansetron (ZOFRAN) 4 MG tablet Take 1 tablet (4 mg total) by mouth every 6 (six) hours as needed for nausea or vomiting. 12 tablet 0  . spironolactone (ALDACTONE) 25 MG tablet Take 25 mg by mouth 2 (two) times daily.    . sucralfate (CARAFATE) 1 g tablet Take 1 tablet (1 g total) by mouth 4 (four) times daily -  with meals and at bedtime. 28  tablet 0   No current facility-administered medications for this visit.     Allergies as of 01/14/2018 - Review Complete 01/14/2018  Allergen Reaction Noted  . Codeine Nausea And Vomiting 09/12/2011  . Diclofenac  01/08/2018  . Lisinopril Other (See Comments) 09/10/2016    Family History  Problem Relation Age of Onset  . Diabetes type II Mother   . Hypertension Mother   . Diabetes type II Sister   . Hypertension  Sister   . Prostate cancer Father   . Colon cancer Neg Hx     Social History   Socioeconomic History  . Marital status: Married    Spouse name: Not on file  . Number of children: Not on file  . Years of education: Not on file  . Highest education level: Not on file  Occupational History  . Occupation: employed    Engineer, miningmployer: [employer]    Comment: [employer]  Social Needs  . Financial resource strain: Not on file  . Food insecurity:    Worry: Not on file    Inability: Not on file  . Transportation needs:    Medical: Not on file    Non-medical: Not on file  Tobacco Use  . Smoking status: Never Smoker  . Smokeless tobacco: Never Used  Substance and Sexual Activity  . Alcohol use: No  . Drug use: No  . Sexual activity: Yes    Birth control/protection: Surgical  Lifestyle  . Physical activity:    Days per week: Not on file    Minutes per session: Not on file  . Stress: Not on file  Relationships  . Social connections:    Talks on phone: Not on file    Gets together: Not on file    Attends religious service: Not on file    Active member of club or organization: Not on file    Attends meetings of clubs or organizations: Not on file    Relationship status: Not on file  . Intimate partner violence:    Fear of current or ex partner: Not on file    Emotionally abused: Not on file    Physically abused: Not on file    Forced sexual activity: Not on file  Other Topics Concern  . Not on file  Social History Narrative  . Not on file    Review of Systems: Complete ROS negative except as per HPI.    Physical Exam: BP 126/74   Pulse 84   Temp 97.7 F (36.5 C) (Oral)   Ht 5\' 7"  (1.702 m)   Wt 203 lb (92.1 kg)   BMI 31.79 kg/m  General:   Alert and oriented. Pleasant and cooperative. Well-nourished and well-developed.  Eyes:  Without icterus, sclera clear and conjunctiva pink.  Ears:  Normal auditory acuity. Cardiovascular:  S1, S2 present without  murmurs appreciated. Extremities without clubbing or edema. Respiratory:  Clear to auscultation bilaterally. No wheezes, rales, or rhonchi. No distress.  Gastrointestinal:  +BS, soft, non-tender and non-distended. No HSM noted. No guarding or rebound. No masses appreciated.  Rectal:  Deferred  Musculoskalatal:  Symmetrical without gross deformities. Neurologic:  Alert and oriented x4;  grossly normal neurologically. Psych:  Alert and cooperative. Normal mood and affect. Heme/Lymph/Immune: No excessive bruising noted.    01/14/2018 11:53 AM   Disclaimer: This note was dictated with voice recognition software. Similar sounding words can inadvertently be transcribed and may not be corrected upon review.

## 2018-01-14 NOTE — Assessment & Plan Note (Signed)
Patient was recently seen in the emergency department.  She was having chest pain but symptomatically improved with Carafate.  She was diagnosed with GERD/gastritis.  She has been on Prilosec 20 mg daily.  Increase this to 40 mg once a day, but she feels the increased dose caused her diarrhea.  I will change her Prilosec to 20 mg twice daily.  She can continue Carafate as needed up to 4 times a day for breakthrough symptoms.  Follow-up in 3 months.

## 2018-01-14 NOTE — Assessment & Plan Note (Signed)
In the emergency room she had noted pain in the upper abdomen/epigastric area.  Today on exam she does not have any pain in these areas.  Her PPI increased dose is likely helping her symptoms.  We will make changes as per above.  Follow-up in 3 months.

## 2018-01-14 NOTE — Assessment & Plan Note (Signed)
She has noted weakness over the past couple few days.  Her blood pressure today is 126/74.  She states it is typically systolic closer to the 200s.  So while her blood pressure is normal/good today, it is low for her baseline.  She is notified her primary care and they have titrated her meds.  Recommend follow-up with her primary care for further blood pressure management.

## 2018-01-15 NOTE — Progress Notes (Signed)
cc'd to pcp 

## 2018-02-04 ENCOUNTER — Encounter: Payer: Self-pay | Admitting: Cardiology

## 2018-03-04 ENCOUNTER — Ambulatory Visit: Payer: BLUE CROSS/BLUE SHIELD | Admitting: Cardiology

## 2018-03-05 ENCOUNTER — Encounter: Payer: Self-pay | Admitting: Cardiology

## 2018-03-05 ENCOUNTER — Ambulatory Visit: Payer: BLUE CROSS/BLUE SHIELD | Admitting: Cardiology

## 2018-03-05 VITALS — BP 148/79 | HR 75 | Ht 67.0 in | Wt 236.4 lb

## 2018-03-05 DIAGNOSIS — I10 Essential (primary) hypertension: Secondary | ICD-10-CM

## 2018-03-05 DIAGNOSIS — K219 Gastro-esophageal reflux disease without esophagitis: Secondary | ICD-10-CM | POA: Diagnosis not present

## 2018-03-05 DIAGNOSIS — I422 Other hypertrophic cardiomyopathy: Secondary | ICD-10-CM

## 2018-03-05 NOTE — Progress Notes (Signed)
Cardiology Office Note  Date: 03/05/2018   ID: Traci Chandler, DOB 1955/09/13, MRN [id number]  PCP: [facility]  Primary Cardiologist: Nona Dell, MD   Chief Complaint  Patient presents with  . Cardiomyopathy    History of Present Illness: Traci Chandler is a 62 y.o. female last seen in March 2018.  She is here for a routine follow-up visit.  Overall states that she has been doing well recently. Records indicate ER visit in July with chest pain at which point she ruled out for ACS.  Notes indicate suspicion that symptoms were GI in etiology, significantly improved after Carafate.  Her antihypertensive medications have also been adjusted as outlined below.  She [facility] to [facility] the health [facility].  She tells me that she has been exercising on a stationary bicycle for 30 minutes almost every morning of the week.  She reports NYHA class II dyspnea, no exertional chest pain, palpitations, or syncope.  Last echocardiogram was in 2017 as outlined below.  We discussed obtaining a follow-up study.  I did review her recent ECG from July.  Past Medical History:  Diagnosis Date  . Essential hypertension, benign   . GERD (gastroesophageal reflux disease)   . Hypertrophic non-obstructive cardiomyopathy (HCC)    Echocardiogram 2011    Past Surgical History:  Procedure Laterality Date  . ABDOMINAL HYSTERECTOMY    . COLONOSCOPY N/A 04/15/2013   Procedure: COLONOSCOPY;  Surgeon: West Bali, MD;  [facility];  Service: Endoscopy;  Laterality: N/A;  9:30    Current Outpatient Medications  Medication Sig Dispense Refill  . aspirin 81 MG tablet Take 81 mg by mouth every evening.     Marland Kitchen atenolol (TENORMIN) 50 MG tablet Take 1 tablet by mouth daily.  5  . NIFEdipine (PROCARDIA-XL/ADALAT CC) 60 MG 24 hr tablet Take 1 tablet by mouth daily.  5  . omega-3 acid ethyl esters (LOVAZA) 1 g capsule Take 1 g by mouth 2 (two) times daily.    Marland Kitchen  omeprazole (PRILOSEC) 20 MG capsule Take 1 capsule (20 mg total) by mouth 2 (two) times daily before a meal. 60 capsule 3  . ondansetron (ZOFRAN) 4 MG tablet Take 1 tablet (4 mg total) by mouth every 6 (six) hours as needed for nausea or vomiting. 12 tablet 0  . spironolactone (ALDACTONE) 25 MG tablet Take 25 mg by mouth 2 (two) times daily.    . sucralfate (CARAFATE) 1 g tablet Take 1 tablet (1 g total) by mouth 4 (four) times daily -  with meals and at bedtime. 28 tablet 0   No current facility-administered medications for this visit.    Allergies:  Codeine; Diclofenac; and Lisinopril   Social History: The patient  reports that she has never smoked. She has never used smokeless tobacco. She reports that she does not drink alcohol or use drugs.   ROS:  Please see the history of present illness. Otherwise, complete review of systems is positive for none.  All other systems are reviewed and negative.   Physical Exam: VS:  BP (!) 148/79   Pulse 75   Ht 5\' 7"  (1.702 m)   Wt 236 lb 6.4 oz (107.2 kg)   SpO2 97% Comment: on room air  BMI 37.03 kg/m , BMI Body mass index is 37.03 kg/m.  Wt Readings from Last 3 Encounters:  03/05/18 236 lb 6.4 oz (107.2 kg)  01/14/18 203 lb (92.1 kg)  01/08/18 250 lb (113.4  kg)    General: Obese woman, appears comfortable at rest. HEENT: Conjunctiva and lids normal, oropharynx clear. Neck: Supple, no elevated JVP or carotid bruits, no thyromegaly. Lungs: Clear to auscultation, nonlabored breathing at rest. Cardiac: Regular rate and rhythm, no S3, 2-3/6 systolic murmur. Abdomen: Soft, nontender, bowel sounds present. Extremities: Trace ankle edema, distal pulses 2+. Skin: Warm and dry. Musculoskeletal: No kyphosis. Neuropsychiatric: Alert and oriented x3, affect grossly appropriate.  ECG: I personally reviewed the tracing from 01/08/2018 which showed sinus rhythm with prolonged PR interval, IVCD and nonspecific T wave changes.  Recent  Labwork: 01/08/2018: ALT 21; AST 26; BUN 34; Creatinine, Ser 1.60; Hemoglobin 14.4; Platelets 126; Potassium 5.0; Sodium 136   Other Studies Reviewed Today:  Echocardiogram 08/10/2015: Study Conclusions  - Left ventricle: The cavity size was normal. Wall thickness was   increased in a pattern of severe LVH. There was severe asymmetric   hypertrophy of the septum. Chordal SAM with turbulent LVOT flow   noted. Systolic function was vigorous. The estimated ejection   fraction was in the range of 65% to 70%. Wall motion was normal;   there were no regional wall motion abnormalities. Doppler   parameters are consistent with abnormal left ventricular   relaxation (grade 1 diastolic dysfunction). Doppler parameters   are consistent with high ventricular filling pressure. - Aortic valve: There was trivial regurgitation. Mean gradient (S):   6 mm Hg. - Mitral valve: Mildly calcified leaflets . There was trivial   regurgitation. - Left atrium: The atrium was at the upper limits of normal in   size. - Right atrium: Central venous pressure (est): 3 mm Hg. - Tricuspid valve: There was trivial regurgitation. - Pulmonary arteries: Systolic pressure could not be accurately   estimated. - Pericardium, extracardiac: There was no pericardial effusion.  Impressions:  - Severe asymmetric septal LV hypertrophy with chordal SAM and   turbulent LVOT flow consistent with hypertrophic cardiomyopathy.   LVEF 65-70%. Grade 1 diastolic dysfunction with increased filling   pressures. Limited interrogation of the LVOT does not suggest any   substantial resting gradient. Mean transaortic gradient is 6   mmHg. Trivial aortic, mitral, and tricuspid regurgitation.  Assessment and Plan:  1.  Hypertrophic nonobstructive cardiomyopathy by history.  She has been symptomatically stable and is on combination of calcium channel blocker and beta-blocker.  Follow-up echocardiogram will be obtained in comparison to  previous study from 2017.  2.  Essential hypertension, medications were reviewed as outlined above.  Keep follow-up with the health department.  Also discussed regular exercise plan and low-sodium diet.  3.  Reflux symptoms, now on Prilosec and Carafate.  Keep follow-up with the health department.  Current medicines were reviewed with the patient today.   Orders Placed This Encounter  Procedures  . ECHOCARDIOGRAM COMPLETE    Disposition: Follow-up in 1 year.  Signed, Jonelle SidleSamuel G. Aleeyah Bensen, MD, [facility]ACC 03/05/2018 1:46 PM    [facility] [address], [place], [place] [address] Phone: [phone]; Fax: [phone]

## 2018-03-05 NOTE — Patient Instructions (Signed)

## 2018-03-12 ENCOUNTER — Ambulatory Visit (HOSPITAL_COMMUNITY)
Admission: RE | Admit: 2018-03-12 | Discharge: 2018-03-12 | Disposition: A | Discharge status: Home / Self Care | Payer: BLUE CROSS/BLUE SHIELD | Source: Ambulatory Visit | Attending: Cardiology | Admitting: Cardiology

## 2018-03-12 DIAGNOSIS — I422 Other hypertrophic cardiomyopathy: Secondary | ICD-10-CM | POA: Diagnosis not present

## 2018-03-12 DIAGNOSIS — I10 Essential (primary) hypertension: Secondary | ICD-10-CM | POA: Insufficient documentation

## 2018-03-12 DIAGNOSIS — I080 Rheumatic disorders of both mitral and aortic valves: Secondary | ICD-10-CM | POA: Insufficient documentation

## 2018-03-12 DIAGNOSIS — K219 Gastro-esophageal reflux disease without esophagitis: Secondary | ICD-10-CM | POA: Diagnosis not present

## 2018-03-12 DIAGNOSIS — I421 Obstructive hypertrophic cardiomyopathy: Secondary | ICD-10-CM | POA: Diagnosis present

## 2018-03-12 NOTE — Progress Notes (Signed)
*  PRELIMINARY RESULTS* Echocardiogram 2D Echocardiogram has been performed.  Jeryl Columbia 03/12/2018, 11:33 AM

## 2018-03-13 ENCOUNTER — Telehealth: Payer: Self-pay | Admitting: Cardiology

## 2018-03-13 NOTE — Telephone Encounter (Signed)
-----   Message from Jonelle Sidle, MD sent at 03/12/2018  2:38 PM EDT ----- Results reviewed.  LVEF remains stable at 65 to 70% with findings consistent with hypertrophic cardiomyopathy as before, but no significant resting LVOT gradient.  This is similar to her previous echocardiogram and we will continue with present medical therapy and follow-up plan. A copy of this test should be forwarded to [facility], [facility].

## 2018-03-13 NOTE — Telephone Encounter (Signed)
Please return pts call

## 2018-03-13 NOTE — Telephone Encounter (Signed)
Patient informed and copy sent to PCP. 

## 2018-04-23 ENCOUNTER — Encounter: Payer: Self-pay | Admitting: Nurse Practitioner

## 2018-04-23 ENCOUNTER — Ambulatory Visit: Payer: BLUE CROSS/BLUE SHIELD | Admitting: Nurse Practitioner

## 2018-04-23 ENCOUNTER — Telehealth: Payer: Self-pay | Admitting: Nurse Practitioner

## 2018-04-23 NOTE — Telephone Encounter (Signed)
Patient was a no show and letter sent  °

## 2018-07-12 ENCOUNTER — Other Ambulatory Visit: Payer: Self-pay | Admitting: Nurse Practitioner

## 2018-07-12 DIAGNOSIS — R531 Weakness: Secondary | ICD-10-CM

## 2018-07-12 DIAGNOSIS — K219 Gastro-esophageal reflux disease without esophagitis: Secondary | ICD-10-CM

## 2018-07-12 DIAGNOSIS — R1010 Upper abdominal pain, unspecified: Secondary | ICD-10-CM

## 2018-12-25 DIAGNOSIS — Z23 Encounter for immunization: Secondary | ICD-10-CM | POA: Diagnosis not present

## 2018-12-25 DIAGNOSIS — Z0000 Encounter for general adult medical examination without abnormal findings: Secondary | ICD-10-CM | POA: Diagnosis not present

## 2018-12-25 DIAGNOSIS — K219 Gastro-esophageal reflux disease without esophagitis: Secondary | ICD-10-CM | POA: Diagnosis not present

## 2018-12-25 DIAGNOSIS — I10 Essential (primary) hypertension: Secondary | ICD-10-CM | POA: Diagnosis not present

## 2018-12-25 DIAGNOSIS — Z1231 Encounter for screening mammogram for malignant neoplasm of breast: Secondary | ICD-10-CM | POA: Diagnosis not present

## 2018-12-25 DIAGNOSIS — G5600 Carpal tunnel syndrome, unspecified upper limb: Secondary | ICD-10-CM | POA: Diagnosis not present

## 2018-12-25 DIAGNOSIS — Z7189 Other specified counseling: Secondary | ICD-10-CM | POA: Diagnosis not present

## 2018-12-30 DIAGNOSIS — Z23 Encounter for immunization: Secondary | ICD-10-CM | POA: Diagnosis not present

## 2019-03-05 ENCOUNTER — Encounter: Payer: Self-pay | Admitting: Cardiology

## 2019-03-05 ENCOUNTER — Ambulatory Visit (INDEPENDENT_AMBULATORY_CARE_PROVIDER_SITE_OTHER): Payer: PPO | Admitting: Cardiology

## 2019-03-05 VITALS — BP 147/78 | HR 68 | Temp 97.3°F | Ht 67.0 in | Wt 248.0 lb

## 2019-03-05 DIAGNOSIS — I10 Essential (primary) hypertension: Secondary | ICD-10-CM | POA: Diagnosis not present

## 2019-03-05 DIAGNOSIS — I422 Other hypertrophic cardiomyopathy: Secondary | ICD-10-CM

## 2019-03-05 NOTE — Patient Instructions (Signed)
Medication Instructions: Your physician recommends that you continue on your current medications as directed. Please refer to the Current Medication list given to you today.   Labwork: none  Procedures/Testing: None  Follow-Up: 1 year with Dr.McDowell  Any Additional Special Instructions Will Be Listed Below (If Applicable).     If you need a refill on your cardiac medications before your next appointment, please call your pharmacy.     Thank you for choosing [facility] !        

## 2019-03-05 NOTE — Progress Notes (Signed)
Cardiology Office Note  Date: 03/05/2019   ID: Traci Chandler, DOB [birth year], MRN [id number]  PCP:  Janie Morning, DO  Cardiologist:  Rozann Lesches, MD Electrophysiologist:  None   Chief Complaint  Patient presents with  . Cardiac follow-up    History of Present Illness: Traci Chandler is a 63 y.o. female last seen in September 2019.  She is here for a routine visit.  She does not report any exertional chest pain, increasing shortness of breath, or syncope since last assessment.  She has not been exercising as regularly due to knee pain.  Echocardiogram from September 2019 is outlined below.  She did not have a significant LVOT gradient at that time.  She is now following with a new PCP in [place].  Chart updated.  We are requesting her interval lab work.  I reviewed her medications which are outlined below.  I personally reviewed her ECG today which shows sinus rhythm with PVCs.  Past Medical History:  Diagnosis Date  . Essential hypertension   . GERD (gastroesophageal reflux disease)   . Hypertrophic non-obstructive cardiomyopathy ([place])    Echocardiogram 2011    Past Surgical History:  Procedure Laterality Date  . ABDOMINAL HYSTERECTOMY    . COLONOSCOPY N/A 04/15/2013   Procedure: COLONOSCOPY;  Surgeon: Danie Binder, MD;  [facility];  Service: Endoscopy;  Laterality: N/A;  9:30    Current Outpatient Medications  Medication Sig Dispense Refill  . aspirin 81 MG tablet Take 81 mg by mouth every evening.     Marland Kitchen atenolol (TENORMIN) 50 MG tablet Take 1 tablet by mouth daily.  5  . NIFEdipine (PROCARDIA-XL/ADALAT CC) 60 MG 24 hr tablet Take 1 tablet by mouth daily.  5  . omeprazole (PRILOSEC) 20 MG capsule TAKE 1 CAPSULE BY MOUTH TWICE DAILY BEFORE A MEAL. 60 capsule 3  . spironolactone (ALDACTONE) 25 MG tablet Take 25 mg by mouth 2 (two) times daily.    . Turmeric 500 MG CAPS Take by mouth.     No current facility-administered medications for this  visit.    Allergies:  Codeine, Diclofenac, and Lisinopril   Social History: The patient  reports that she has never smoked. She has never used smokeless tobacco. She reports that she does not drink alcohol or use drugs.   ROS:  Please see the history of present illness. Otherwise, complete review of systems is positive for none.  All other systems are reviewed and negative.   Physical Exam: VS:  BP (!) 147/78   Pulse 68   Temp (!) 97.3 F (36.3 C)   Ht 5\' 7"  (1.702 m)   Wt 248 lb (112.5 kg)   BMI 38.84 kg/m , BMI Body mass index is 38.84 kg/m.  Wt Readings from Last 3 Encounters:  03/05/19 248 lb (112.5 kg)  03/05/18 236 lb 6.4 oz (107.2 kg)  01/14/18 203 lb (92.1 kg)    General: Patient appears comfortable at rest. HEENT: Conjunctiva and lids normal, wearing a mask. Neck: Supple, no elevated JVP or carotid bruits, no thyromegaly. Lungs: Clear to auscultation, nonlabored breathing at rest. Cardiac: Regular rate and rhythm, no S3, 2/6 systolic murmur, no pericardial rub. Abdomen: Soft, nontender, bowel sounds present. Extremities: No pitting edema, distal pulses 2+. Skin: Warm and dry. Musculoskeletal: No kyphosis. Neuropsychiatric: Alert and oriented x3, affect grossly appropriate.  ECG:  An ECG dated 01/08/2018 was personally reviewed today and demonstrated:  Sinus rhythm with prolonged PR interval, IVCD and  nonspecific T wave changes.  Recent Labwork:  01/08/2018: ALT 21; AST 26; BUN 34; Creatinine, Ser 1.60; Hemoglobin 14.4; Platelets 126; Potassium 5.0; Sodium 136   Other Studies Reviewed Today:  Echocardiogram 03/12/2018: Study Conclusions  - Left ventricle: The cavity size was normal. Systolic function was   vigorous. The estimated ejection fraction was in the range of 65%   to 70%. Wall motion was normal; there were no regional wall   motion abnormalities. Findings consistent with left ventricular   diastolic dysfunction, grade indeterminate. Doppler parameters    are consistent with high ventricular filling pressure. Severe   asymmetric septal hypertrophy. Systolic anterior motion of the   anterior mitral leaflet is appreciated. No signficant resting   LVOT gradient. - Aortic valve: There was mild regurgitation. - Mitral valve: There was mild regurgitation.  Assessment and Plan:  1.  Hypertrophic cardiomyopathy without significant LVOT gradient and currently asymptomatic.  Echocardiogram from September 2019 is reviewed above.  No significant change in symptoms or heart murmur on examination.  She remains on atenolol and nifedipine.  Continue with observation.  2.  Essential hypertension, systolic is in the 140s today.  We discussed a basic exercise plan, also salt restriction.  Keep follow-up with PCP.  Interval lab work being requested.  Medication Adjustments/Labs and Tests Ordered: Current medicines are reviewed at length with the patient today.  Concerns regarding medicines are outlined above.   Tests Ordered: Orders Placed This Encounter  Procedures  . EKG 12-Lead    Medication Changes: No orders of the defined types were placed in this encounter.   Disposition:  Follow [place] 1 year in the [place] office.  Signed, Jonelle SidleSamuel G. Rane Blitch, MD, [facility] 03/05/2019 11:59 AM    [facility] [address], [place], [place] [address] Phone: [phone]; Fax: [phone]

## 2019-03-11 ENCOUNTER — Other Ambulatory Visit: Payer: Self-pay | Admitting: Nurse Practitioner

## 2019-03-11 DIAGNOSIS — R1010 Upper abdominal pain, unspecified: Secondary | ICD-10-CM

## 2019-03-11 DIAGNOSIS — K219 Gastro-esophageal reflux disease without esophagitis: Secondary | ICD-10-CM

## 2019-03-11 DIAGNOSIS — R531 Weakness: Secondary | ICD-10-CM

## 2019-03-13 DIAGNOSIS — Z23 Encounter for immunization: Secondary | ICD-10-CM | POA: Diagnosis not present

## 2019-04-15 ENCOUNTER — Other Ambulatory Visit (HOSPITAL_COMMUNITY): Payer: Self-pay | Admitting: Family Medicine

## 2019-04-15 DIAGNOSIS — Z1231 Encounter for screening mammogram for malignant neoplasm of breast: Secondary | ICD-10-CM

## 2019-04-20 ENCOUNTER — Other Ambulatory Visit: Payer: Self-pay

## 2019-04-20 ENCOUNTER — Ambulatory Visit (HOSPITAL_COMMUNITY)
Admission: RE | Admit: 2019-04-20 | Discharge: 2019-04-20 | Disposition: A | Discharge status: Home / Self Care | Payer: PPO | Source: Ambulatory Visit | Attending: Family Medicine | Admitting: Family Medicine

## 2019-04-20 DIAGNOSIS — Z1231 Encounter for screening mammogram for malignant neoplasm of breast: Secondary | ICD-10-CM | POA: Diagnosis not present

## 2019-05-01 DIAGNOSIS — M79671 Pain in right foot: Secondary | ICD-10-CM | POA: Diagnosis not present

## 2019-05-01 DIAGNOSIS — M7989 Other specified soft tissue disorders: Secondary | ICD-10-CM | POA: Diagnosis not present

## 2019-05-01 DIAGNOSIS — R208 Other disturbances of skin sensation: Secondary | ICD-10-CM | POA: Diagnosis not present

## 2019-05-05 DIAGNOSIS — N183 Chronic kidney disease, stage 3 unspecified: Secondary | ICD-10-CM | POA: Diagnosis not present

## 2019-05-05 DIAGNOSIS — I129 Hypertensive chronic kidney disease with stage 1 through stage 4 chronic kidney disease, or unspecified chronic kidney disease: Secondary | ICD-10-CM | POA: Diagnosis not present

## 2019-05-05 DIAGNOSIS — M25561 Pain in right knee: Secondary | ICD-10-CM | POA: Diagnosis not present

## 2019-05-05 DIAGNOSIS — M1711 Unilateral primary osteoarthritis, right knee: Secondary | ICD-10-CM | POA: Diagnosis not present

## 2019-05-05 DIAGNOSIS — M79671 Pain in right foot: Secondary | ICD-10-CM | POA: Diagnosis not present

## 2019-05-13 ENCOUNTER — Other Ambulatory Visit: Payer: Self-pay

## 2019-07-02 ENCOUNTER — Other Ambulatory Visit (HOSPITAL_COMMUNITY): Payer: Self-pay | Admitting: Family Medicine

## 2019-07-02 ENCOUNTER — Other Ambulatory Visit: Payer: Self-pay | Admitting: Family Medicine

## 2019-07-02 DIAGNOSIS — M79671 Pain in right foot: Secondary | ICD-10-CM

## 2019-07-07 ENCOUNTER — Other Ambulatory Visit: Payer: Self-pay

## 2019-07-07 ENCOUNTER — Ambulatory Visit (HOSPITAL_COMMUNITY)
Admission: RE | Admit: 2019-07-07 | Discharge: 2019-07-07 | Disposition: A | Discharge status: Home / Self Care | Payer: PPO | Source: Ambulatory Visit | Attending: Family Medicine | Admitting: Family Medicine

## 2019-07-07 DIAGNOSIS — M79671 Pain in right foot: Secondary | ICD-10-CM

## 2019-07-09 ENCOUNTER — Other Ambulatory Visit: Payer: Self-pay | Admitting: Gastroenterology

## 2019-07-09 ENCOUNTER — Other Ambulatory Visit: Payer: PPO

## 2019-07-09 DIAGNOSIS — R531 Weakness: Secondary | ICD-10-CM

## 2019-07-09 DIAGNOSIS — R1010 Upper abdominal pain, unspecified: Secondary | ICD-10-CM

## 2019-07-09 DIAGNOSIS — K219 Gastro-esophageal reflux disease without esophagitis: Secondary | ICD-10-CM

## 2019-07-16 ENCOUNTER — Ambulatory Visit: Payer: PPO | Admitting: Podiatry

## 2019-07-16 ENCOUNTER — Other Ambulatory Visit: Payer: Self-pay

## 2019-07-16 DIAGNOSIS — M79671 Pain in right foot: Secondary | ICD-10-CM | POA: Diagnosis not present

## 2019-07-16 DIAGNOSIS — M722 Plantar fascial fibromatosis: Secondary | ICD-10-CM | POA: Diagnosis not present

## 2019-07-16 NOTE — Progress Notes (Addendum)
  Subjective:  Patient ID: Traci Chandler, female    DOB: [birth year],  MRN: [id number]  Chief Complaint  Patient presents with  . Foot Pain    Pt states episodic plantar midfoot pain which spreads all over entire foot, pain lasts for days at a time, 3 month duration. Pt states was given doxycycline for this previously.    64 y.o. female presents with the above complaint. History confirmed with patient.   Objective:  Physical Exam: warm, good capillary refill, no trophic changes or ulcerative lesions, normal DP and PT pulses and normal sensory exam. Right Foot: point tenderness of the mid plantar fascia   MRI reviewed. HAV reviewed but no findings of plantar fasciitis  Assessment:   1. Plantar fasciitis   2. Arch pain of right foot    Plan:  Patient was evaluated and treated and all questions answered.  Plantar Fasciitis -MRI reviewed with patient -Educated patient on stretching and icing of the affected limb -Plantar fascial brace dispensed   Return in about 3 weeks (around 08/06/2019) for Plantar fasciitis, Right.

## 2019-08-06 ENCOUNTER — Ambulatory Visit: Payer: PPO | Admitting: Podiatry

## 2019-08-07 ENCOUNTER — Other Ambulatory Visit: Payer: Self-pay | Admitting: Gastroenterology

## 2019-08-07 DIAGNOSIS — J329 Chronic sinusitis, unspecified: Secondary | ICD-10-CM | POA: Diagnosis not present

## 2019-08-07 DIAGNOSIS — R1010 Upper abdominal pain, unspecified: Secondary | ICD-10-CM

## 2019-08-07 DIAGNOSIS — K219 Gastro-esophageal reflux disease without esophagitis: Secondary | ICD-10-CM

## 2019-08-07 DIAGNOSIS — R531 Weakness: Secondary | ICD-10-CM

## 2019-08-20 ENCOUNTER — Other Ambulatory Visit: Payer: Self-pay

## 2019-08-20 ENCOUNTER — Ambulatory Visit (INDEPENDENT_AMBULATORY_CARE_PROVIDER_SITE_OTHER): Payer: PPO | Admitting: Podiatry

## 2019-08-20 ENCOUNTER — Other Ambulatory Visit: Payer: Self-pay | Admitting: Podiatry

## 2019-08-20 ENCOUNTER — Ambulatory Visit (INDEPENDENT_AMBULATORY_CARE_PROVIDER_SITE_OTHER): Payer: PPO

## 2019-08-20 DIAGNOSIS — M79672 Pain in left foot: Secondary | ICD-10-CM

## 2019-08-20 DIAGNOSIS — M7672 Peroneal tendinitis, left leg: Secondary | ICD-10-CM | POA: Diagnosis not present

## 2019-08-20 DIAGNOSIS — S93312A Subluxation of tarsal joint of left foot, initial encounter: Secondary | ICD-10-CM

## 2019-08-20 NOTE — Progress Notes (Signed)
  Subjective:  Patient ID: Traci Chandler, female    DOB: February 13, 1956,  MRN: [id number]  Chief Complaint  Patient presents with  . Foot Pain    Left lateral foot pain 3 weeks duration "I woke up with a cramp and it never went away"    64 y.o. female presents with the above complaint. History confirmed with patient.   Objective:  Physical Exam: warm, good capillary refill, no trophic changes or ulcerative lesions, normal DP and PT pulses and normal sensory exam. Left Foot: POP at the cuboid, 5th TMT Right Foot: normal exam, no swelling, tenderness, instability; ligaments intact, full range of motion of all ankle/foot joints   No images are attached to the encounter.  Radiographs: X-ray of the left foot: subtle cuboid subluxation no acute fractures   Assessment:   1. Peroneal tendonitis of left lower extremity   2. Cuboid syndrome of left foot      Plan:  Patient was evaluated and treated and all questions answered.  Cuboid Subluxation  -XR taken and reviewed -Educated on etiology  -Cuboid whip manipulation performed -Trilock dispensed    Return in about 3 weeks (around 09/10/2019) for Cuboid subluxation f/u with XRs.

## 2019-08-21 ENCOUNTER — Ambulatory Visit: Payer: PPO | Admitting: Podiatry

## 2019-09-10 ENCOUNTER — Ambulatory Visit (INDEPENDENT_AMBULATORY_CARE_PROVIDER_SITE_OTHER): Payer: PPO

## 2019-09-10 ENCOUNTER — Other Ambulatory Visit: Payer: Self-pay

## 2019-09-10 ENCOUNTER — Ambulatory Visit: Payer: PPO | Admitting: Podiatry

## 2019-09-10 ENCOUNTER — Other Ambulatory Visit: Payer: Self-pay | Admitting: Podiatry

## 2019-09-10 VITALS — Temp 97.1°F

## 2019-09-10 DIAGNOSIS — M79671 Pain in right foot: Secondary | ICD-10-CM

## 2019-09-10 DIAGNOSIS — G5761 Lesion of plantar nerve, right lower limb: Secondary | ICD-10-CM | POA: Diagnosis not present

## 2019-09-10 DIAGNOSIS — S93312A Subluxation of tarsal joint of left foot, initial encounter: Secondary | ICD-10-CM | POA: Diagnosis not present

## 2019-09-10 NOTE — Progress Notes (Signed)
  Subjective:  Patient ID: Traci Chandler, female    DOB: [birth year],  MRN: [id number]  Chief Complaint  Patient presents with  . Foot Pain    Right generalized foot pain, 10 day duration, no known injuries. Pt states it was severe initially, but is less pronounced now.  . Foot Pain    Pt states left foot pain has resolved, tri-lock was painful to wear so pt discontinued use.    64 y.o. female presents with the above complaint. History confirmed with patient.   Objective:  Physical Exam: warm, good capillary refill, no trophic changes or ulcerative lesions, normal DP and PT pulses and normal sensory exam. Left Foot: normal exam, no swelling, tenderness, instability; ligaments intact, full range of motion of all ankle/foot joints  Right Foot: tenderness between the 3rd and 4th metatarsal head   No images are attached to the encounter.  Radiographs: X-ray of the right foot: no fracture, dislocation, swelling or degenerative changes noted Assessment:   1. Morton neuroma, right   2. Cuboid syndrome of left foot    Plan:  Patient was evaluated and treated and all questions answered.  Cuboid Syndrome L -Improved, no pain today  Morton Neuroma -Educated on etiology -Educated on padding and proper shoegear -XR reviewed with patient -Injection delivered to the affected interspaces  Procedure: Neuroma Injection Location: Right 3rd interspace Skin Prep: Alcohol. Injectate: 0.5 cc 0.5% marcaine plain, 0.5 cc celestone phosphate. Disposition: Patient tolerated procedure well. Injection site dressed with a band-aid.   Return in about 3 weeks (around 10/01/2019) for Neuroma, Left.

## 2019-09-10 NOTE — Patient Instructions (Signed)

## 2019-10-01 ENCOUNTER — Ambulatory Visit: Payer: PPO | Admitting: Podiatry

## 2019-12-22 DIAGNOSIS — N390 Urinary tract infection, site not specified: Secondary | ICD-10-CM | POA: Diagnosis not present

## 2019-12-22 DIAGNOSIS — Z0000 Encounter for general adult medical examination without abnormal findings: Secondary | ICD-10-CM | POA: Diagnosis not present

## 2019-12-22 DIAGNOSIS — I10 Essential (primary) hypertension: Secondary | ICD-10-CM | POA: Diagnosis not present

## 2019-12-28 DIAGNOSIS — I129 Hypertensive chronic kidney disease with stage 1 through stage 4 chronic kidney disease, or unspecified chronic kidney disease: Secondary | ICD-10-CM | POA: Diagnosis not present

## 2019-12-28 DIAGNOSIS — Z23 Encounter for immunization: Secondary | ICD-10-CM | POA: Diagnosis not present

## 2019-12-28 DIAGNOSIS — R7309 Other abnormal glucose: Secondary | ICD-10-CM | POA: Diagnosis not present

## 2019-12-28 DIAGNOSIS — R200 Anesthesia of skin: Secondary | ICD-10-CM | POA: Diagnosis not present

## 2019-12-28 DIAGNOSIS — N183 Chronic kidney disease, stage 3 unspecified: Secondary | ICD-10-CM | POA: Diagnosis not present

## 2019-12-28 DIAGNOSIS — R635 Abnormal weight gain: Secondary | ICD-10-CM | POA: Diagnosis not present

## 2019-12-28 DIAGNOSIS — M25561 Pain in right knee: Secondary | ICD-10-CM | POA: Diagnosis not present

## 2019-12-28 DIAGNOSIS — K219 Gastro-esophageal reflux disease without esophagitis: Secondary | ICD-10-CM | POA: Diagnosis not present

## 2019-12-28 DIAGNOSIS — I422 Other hypertrophic cardiomyopathy: Secondary | ICD-10-CM | POA: Diagnosis not present

## 2019-12-28 DIAGNOSIS — M25551 Pain in right hip: Secondary | ICD-10-CM | POA: Diagnosis not present

## 2019-12-28 DIAGNOSIS — G5600 Carpal tunnel syndrome, unspecified upper limb: Secondary | ICD-10-CM | POA: Diagnosis not present

## 2019-12-28 DIAGNOSIS — Z0000 Encounter for general adult medical examination without abnormal findings: Secondary | ICD-10-CM | POA: Diagnosis not present

## 2020-01-12 ENCOUNTER — Other Ambulatory Visit: Payer: Self-pay

## 2020-01-13 DIAGNOSIS — M545 Low back pain: Secondary | ICD-10-CM | POA: Diagnosis not present

## 2020-01-14 ENCOUNTER — Other Ambulatory Visit: Payer: Self-pay | Admitting: Orthopedic Surgery

## 2020-01-14 ENCOUNTER — Other Ambulatory Visit (HOSPITAL_COMMUNITY): Payer: Self-pay | Admitting: Orthopedic Surgery

## 2020-01-14 DIAGNOSIS — M545 Low back pain, unspecified: Secondary | ICD-10-CM

## 2020-01-15 DIAGNOSIS — G5602 Carpal tunnel syndrome, left upper limb: Secondary | ICD-10-CM | POA: Diagnosis not present

## 2020-01-15 DIAGNOSIS — G5601 Carpal tunnel syndrome, right upper limb: Secondary | ICD-10-CM | POA: Diagnosis not present

## 2020-01-15 DIAGNOSIS — G5603 Carpal tunnel syndrome, bilateral upper limbs: Secondary | ICD-10-CM | POA: Diagnosis not present

## 2020-01-20 DIAGNOSIS — G5603 Carpal tunnel syndrome, bilateral upper limbs: Secondary | ICD-10-CM | POA: Diagnosis not present

## 2020-01-28 DIAGNOSIS — G5603 Carpal tunnel syndrome, bilateral upper limbs: Secondary | ICD-10-CM | POA: Diagnosis not present

## 2020-02-03 ENCOUNTER — Ambulatory Visit (HOSPITAL_COMMUNITY): Payer: PPO

## 2020-02-08 ENCOUNTER — Ambulatory Visit (INDEPENDENT_AMBULATORY_CARE_PROVIDER_SITE_OTHER): Payer: Medicare Other | Admitting: Podiatry

## 2020-02-08 ENCOUNTER — Other Ambulatory Visit: Payer: Self-pay

## 2020-02-08 ENCOUNTER — Ambulatory Visit (INDEPENDENT_AMBULATORY_CARE_PROVIDER_SITE_OTHER): Payer: Medicare Other

## 2020-02-08 ENCOUNTER — Other Ambulatory Visit: Payer: Self-pay | Admitting: Podiatry

## 2020-02-08 DIAGNOSIS — M722 Plantar fascial fibromatosis: Secondary | ICD-10-CM | POA: Diagnosis not present

## 2020-02-08 DIAGNOSIS — M79672 Pain in left foot: Secondary | ICD-10-CM | POA: Diagnosis not present

## 2020-02-08 DIAGNOSIS — M84374A Stress fracture, right foot, initial encounter for fracture: Secondary | ICD-10-CM

## 2020-02-08 DIAGNOSIS — M79671 Pain in right foot: Secondary | ICD-10-CM

## 2020-02-10 NOTE — Progress Notes (Signed)
  Subjective:  Patient ID: Traci Chandler, female    DOB: 1955-10-17,  MRN: [id number]  Chief Complaint  Patient presents with  . Foot Problem    R dorsal midfoot pain, L plantar midfoot pain. History of R morton's neuroma, plantar fasciitis, and L peroneal tendonitis. Pt stated, "My pain resolved for about 5 months. It came back yesterday. Pain - 10/10 when I stand after sitting".    64 y.o. female presents with the above complaint. History confirmed with patient.  Here today with her daughter, who works at [employer].  The right foot pain is the most severe and she localizes this to the dorsal midfoot  Objective:  Physical Exam: warm, good capillary refill, no trophic changes or ulcerative lesions, normal DP and PT pulses and normal sensory exam. Left Foot: Pain on palpation to the medial calcaneal tubercle Right Foot: Pain on palpation to the central and lateral metatarsals  Radiographs: X-ray of the right foot: Cortical thickening and stress reaction versus stress fracture of the third and fourth metatarsals mid diaphysis Assessment:   1. Right foot pain   2. Plantar fasciitis, left   3. Stress fracture of metatarsal bone of right foot, initial encounter   4. Left foot pain      Plan:  Patient was evaluated and treated and all questions answered.  Discussed the etiology and treatment options for plantar fasciitis including stretching, formal physical therapy, supportive shoegears such as a running shoe or sneaker, pre fabricated orthoses, injection therapy, and oral medications. We also discussed the role of surgical treatment of this for patients who do not improve after exhausting non-surgical treatment options.  Recommended a an injection of the left heel for plantar fasciitis today  After sterile prep with povidone-iodine solution and alcohol, the left heel was injected with 0.5cc 2% xylocaine plain, 0.5cc 0.5% marcaine plain, 5mg  triamcinolone acetonide, and 2mg  dexamethasone was  injected along the plantar medial fascia at the insertion on the plantar calcaneus. The patient tolerated the procedure well without complication.  For the right side, she appears to have evidence of a stress fracture.  I recommended that she be immobilized in a CAM boot, she may be weightbearing as tolerated in this.  I like to see her back in 1 month, we will take new x-rays.  If she is not improving by then we will consider an MRI.     No follow-ups on file.

## 2020-02-11 ENCOUNTER — Ambulatory Visit (HOSPITAL_COMMUNITY)
Admission: RE | Admit: 2020-02-11 | Discharge: 2020-02-11 | Disposition: A | Discharge status: Home / Self Care | Payer: Medicare Other | Source: Ambulatory Visit | Attending: Orthopedic Surgery | Admitting: Orthopedic Surgery

## 2020-02-11 ENCOUNTER — Other Ambulatory Visit: Payer: Self-pay

## 2020-02-11 DIAGNOSIS — M545 Low back pain, unspecified: Secondary | ICD-10-CM

## 2020-02-18 DIAGNOSIS — M545 Low back pain: Secondary | ICD-10-CM | POA: Diagnosis not present

## 2020-02-25 DIAGNOSIS — M545 Low back pain: Secondary | ICD-10-CM | POA: Diagnosis not present

## 2020-02-26 DIAGNOSIS — G5603 Carpal tunnel syndrome, bilateral upper limbs: Secondary | ICD-10-CM | POA: Diagnosis not present

## 2020-03-01 DIAGNOSIS — M2569 Stiffness of other specified joint, not elsewhere classified: Secondary | ICD-10-CM | POA: Diagnosis not present

## 2020-03-01 DIAGNOSIS — M79604 Pain in right leg: Secondary | ICD-10-CM | POA: Diagnosis not present

## 2020-03-01 DIAGNOSIS — R262 Difficulty in walking, not elsewhere classified: Secondary | ICD-10-CM | POA: Diagnosis not present

## 2020-03-01 DIAGNOSIS — M6281 Muscle weakness (generalized): Secondary | ICD-10-CM | POA: Diagnosis not present

## 2020-03-01 DIAGNOSIS — M545 Low back pain: Secondary | ICD-10-CM | POA: Diagnosis not present

## 2020-03-02 NOTE — Progress Notes (Signed)
Cardiology Office Note  Date: 03/03/2020   ID: Traci Chandler, DOB August 16, 1955, MRN [id number]  PCP:  Traci Reichmann, DO  Cardiologist:  Traci Dell, MD Electrophysiologist:  None   Chief Complaint  Patient presents with  . Cardiac follow-up    History of Present Illness: Traci Chandler is a 64 y.o. female last seen in September 2020.  She presents for a routine visit.  From a cardiac perspective, she does not report any worsening shortness of breath, no palpitations or syncope.  She continues to work as a Lawyer.  I personally reviewed her ECG today which shows sinus rhythm with prolonged PR interval and PVC, LVH.  I reviewed her medications which are outlined below.  She reports compliance.  Systolic in the 140s today.  She has had some orthopedic symptoms that have limited her routine activity, now in PT and plans to get back to her walking regimen when she can.  Past Medical History:  Diagnosis Date  . Essential hypertension   . GERD (gastroesophageal reflux disease)   . Hypertrophic non-obstructive cardiomyopathy (HCC)    Echocardiogram 2011    Past Surgical History:  Procedure Laterality Date  . ABDOMINAL HYSTERECTOMY    . COLONOSCOPY N/A 04/15/2013   Procedure: COLONOSCOPY;  Surgeon: West Bali, MD;  [facility];  Service: Endoscopy;  Laterality: N/A;  9:30    Current Outpatient Medications  Medication Sig Dispense Refill  . aspirin 81 MG tablet Take 81 mg by mouth every evening.     Marland Kitchen atenolol (TENORMIN) 50 MG tablet Take 1 tablet by mouth daily.  5  . NIFEdipine (PROCARDIA XL/NIFEDICAL XL) 60 MG 24 hr tablet Take 60 mg by mouth daily.    Marland Kitchen omeprazole (PRILOSEC) 20 MG capsule TAKE 1 CAPSULE 2 TIMES A DAY BEFORE A MEAL. 60 capsule 3  . spironolactone (ALDACTONE) 25 MG tablet Take 25 mg by mouth 2 (two) times daily.    . Turmeric 500 MG CAPS Take by mouth.     No current facility-administered medications for this visit.   Allergies:  Codeine,  Diclofenac, and Lisinopril   ROS:   No syncope.  Reports problems with spinal stenosis, carpal tunnel syndrome.  Physical Exam: VS:  BP (!) 148/84   Pulse 79   Ht 5\' 7"  (1.702 m)   Wt 245 lb (111.1 kg)   SpO2 96%   BMI 38.37 kg/m , BMI Body mass index is 38.37 kg/m.  Wt Readings from Last 3 Encounters:  03/03/20 245 lb (111.1 kg)  03/05/19 248 lb (112.5 kg)  03/05/18 236 lb 6.4 oz (107.2 kg)    General: Patient appears comfortable at rest. HEENT: Conjunctiva and lids normal, wearing a mask. Neck: Supple, no elevated JVP or carotid bruits, no thyromegaly. Lungs: Clear to auscultation, nonlabored breathing at rest. Cardiac: Regular rate and rhythm, no S3, 2/6 systolic murmur, no pericardial rub. Extremities: No pitting edema, distal pulses 2+.  ECG:  An ECG dated 03/05/2019 was personally reviewed today and demonstrated:  Sinus rhythm with PVCs.  Recent Labwork:  No interval lab work for review today.  Other Studies Reviewed Today:  Echocardiogram 03/12/2018: Study Conclusions  - Left ventricle: The cavity size was normal. Systolic function was vigorous. The estimated ejection fraction was in the range of 65% to 70%. Wall motion was normal; there were no regional wall motion abnormalities. Findings consistent with left ventricular diastolic dysfunction, grade indeterminate. Doppler parameters are consistent with high ventricular filling pressure. Severe  asymmetric septal hypertrophy. Systolic anterior motion of the anterior mitral leaflet is appreciated. No signficant resting LVOT gradient. - Aortic valve: There was mild regurgitation. - Mitral valve: There was mild regurgitation.  Assessment and Plan:  1.  Hypertrophic cardiomyopathy, no significant LVOT gradient by echocardiogram in 2019.  Cardiac murmur is stable on examination.  Plan to continue observation on medical therapy which includes atenolol and nifedipine.  2.  Essential hypertension, no  changes to current regimen.  She does plan to get back to a more regular walking plan.  Keep follow-up with PCP.  Medication Adjustments/Labs and Tests Ordered: Current medicines are reviewed at length with the patient today.  Concerns regarding medicines are outlined above.   Tests Ordered: Orders Placed This Encounter  Procedures  . EKG 12-Lead    Medication Changes: No orders of the defined types were placed in this encounter.   Disposition:  Follow up 1 year in the [place] office.  Signed, Traci Sidle, MD, [facility] 03/03/2020 10:12 AM    [facility] at [facility] [address] [place], [place], [place] [address] Phone: [phone]; Fax: [phone]

## 2020-03-03 ENCOUNTER — Encounter: Payer: Self-pay | Admitting: Cardiology

## 2020-03-03 ENCOUNTER — Other Ambulatory Visit: Payer: Self-pay

## 2020-03-03 ENCOUNTER — Ambulatory Visit: Payer: Medicare Other | Admitting: Cardiology

## 2020-03-03 VITALS — BP 148/84 | HR 79 | Ht 67.0 in | Wt 245.0 lb

## 2020-03-03 DIAGNOSIS — I10 Essential (primary) hypertension: Secondary | ICD-10-CM

## 2020-03-03 DIAGNOSIS — I422 Other hypertrophic cardiomyopathy: Secondary | ICD-10-CM

## 2020-03-03 NOTE — Patient Instructions (Signed)
Medication Instructions:   Your physician recommends that you continue on your current medications as directed. Please refer to the Current Medication list given to you today.  Labwork:  None  Testing/Procedures:  None  Follow-Up:  Your physician recommends that you schedule a follow-up appointment in: 1 year at the [facility]. You will receive a reminder letter in the mail in about 10 months reminding you to call and schedule your appointment. If you don't receive this letter, please contact our office.  Any Other Special Instructions Will Be Listed Below (If Applicable).  If you need a refill on your cardiac medications before your next appointment, please call your pharmacy.

## 2020-03-04 DIAGNOSIS — R262 Difficulty in walking, not elsewhere classified: Secondary | ICD-10-CM | POA: Diagnosis not present

## 2020-03-04 DIAGNOSIS — M545 Low back pain: Secondary | ICD-10-CM | POA: Diagnosis not present

## 2020-03-04 DIAGNOSIS — M2569 Stiffness of other specified joint, not elsewhere classified: Secondary | ICD-10-CM | POA: Diagnosis not present

## 2020-03-04 DIAGNOSIS — M6281 Muscle weakness (generalized): Secondary | ICD-10-CM | POA: Diagnosis not present

## 2020-03-04 DIAGNOSIS — M79604 Pain in right leg: Secondary | ICD-10-CM | POA: Diagnosis not present

## 2020-03-08 DIAGNOSIS — M6281 Muscle weakness (generalized): Secondary | ICD-10-CM | POA: Diagnosis not present

## 2020-03-08 DIAGNOSIS — M2569 Stiffness of other specified joint, not elsewhere classified: Secondary | ICD-10-CM | POA: Diagnosis not present

## 2020-03-08 DIAGNOSIS — R262 Difficulty in walking, not elsewhere classified: Secondary | ICD-10-CM | POA: Diagnosis not present

## 2020-03-08 DIAGNOSIS — M79604 Pain in right leg: Secondary | ICD-10-CM | POA: Diagnosis not present

## 2020-03-08 DIAGNOSIS — M545 Low back pain: Secondary | ICD-10-CM | POA: Diagnosis not present

## 2020-03-11 ENCOUNTER — Ambulatory Visit: Payer: Medicare Other | Admitting: Podiatry

## 2020-03-15 DIAGNOSIS — M545 Low back pain: Secondary | ICD-10-CM | POA: Diagnosis not present

## 2020-03-15 DIAGNOSIS — M2569 Stiffness of other specified joint, not elsewhere classified: Secondary | ICD-10-CM | POA: Diagnosis not present

## 2020-03-15 DIAGNOSIS — M79604 Pain in right leg: Secondary | ICD-10-CM | POA: Diagnosis not present

## 2020-03-15 DIAGNOSIS — R262 Difficulty in walking, not elsewhere classified: Secondary | ICD-10-CM | POA: Diagnosis not present

## 2020-03-15 DIAGNOSIS — M6281 Muscle weakness (generalized): Secondary | ICD-10-CM | POA: Diagnosis not present

## 2020-03-18 DIAGNOSIS — G5601 Carpal tunnel syndrome, right upper limb: Secondary | ICD-10-CM | POA: Diagnosis not present

## 2020-03-22 ENCOUNTER — Ambulatory Visit (INDEPENDENT_AMBULATORY_CARE_PROVIDER_SITE_OTHER): Payer: Medicare Other | Admitting: Podiatry

## 2020-03-22 DIAGNOSIS — Z5329 Procedure and treatment not carried out because of patient's decision for other reasons: Secondary | ICD-10-CM

## 2020-03-22 NOTE — Progress Notes (Signed)
No show for appt. 

## 2020-03-24 DIAGNOSIS — M79604 Pain in right leg: Secondary | ICD-10-CM | POA: Diagnosis not present

## 2020-03-24 DIAGNOSIS — M545 Low back pain, unspecified: Secondary | ICD-10-CM | POA: Diagnosis not present

## 2020-03-24 DIAGNOSIS — M6281 Muscle weakness (generalized): Secondary | ICD-10-CM | POA: Diagnosis not present

## 2020-03-24 DIAGNOSIS — R262 Difficulty in walking, not elsewhere classified: Secondary | ICD-10-CM | POA: Diagnosis not present

## 2020-03-24 DIAGNOSIS — M2569 Stiffness of other specified joint, not elsewhere classified: Secondary | ICD-10-CM | POA: Diagnosis not present

## 2020-03-28 ENCOUNTER — Other Ambulatory Visit (HOSPITAL_COMMUNITY): Payer: Self-pay | Admitting: Family Medicine

## 2020-03-28 DIAGNOSIS — Z1231 Encounter for screening mammogram for malignant neoplasm of breast: Secondary | ICD-10-CM

## 2020-03-29 DIAGNOSIS — M2569 Stiffness of other specified joint, not elsewhere classified: Secondary | ICD-10-CM | POA: Diagnosis not present

## 2020-03-29 DIAGNOSIS — M6281 Muscle weakness (generalized): Secondary | ICD-10-CM | POA: Diagnosis not present

## 2020-03-29 DIAGNOSIS — R262 Difficulty in walking, not elsewhere classified: Secondary | ICD-10-CM | POA: Diagnosis not present

## 2020-03-29 DIAGNOSIS — M79604 Pain in right leg: Secondary | ICD-10-CM | POA: Diagnosis not present

## 2020-03-29 DIAGNOSIS — M545 Low back pain, unspecified: Secondary | ICD-10-CM | POA: Diagnosis not present

## 2020-03-31 DIAGNOSIS — G5601 Carpal tunnel syndrome, right upper limb: Secondary | ICD-10-CM | POA: Diagnosis not present

## 2020-03-31 DIAGNOSIS — Z23 Encounter for immunization: Secondary | ICD-10-CM | POA: Diagnosis not present

## 2020-04-05 ENCOUNTER — Other Ambulatory Visit: Payer: Self-pay | Admitting: Nurse Practitioner

## 2020-04-05 DIAGNOSIS — M6281 Muscle weakness (generalized): Secondary | ICD-10-CM | POA: Diagnosis not present

## 2020-04-05 DIAGNOSIS — M545 Low back pain, unspecified: Secondary | ICD-10-CM | POA: Diagnosis not present

## 2020-04-05 DIAGNOSIS — R1010 Upper abdominal pain, unspecified: Secondary | ICD-10-CM

## 2020-04-05 DIAGNOSIS — M2569 Stiffness of other specified joint, not elsewhere classified: Secondary | ICD-10-CM | POA: Diagnosis not present

## 2020-04-05 DIAGNOSIS — M79604 Pain in right leg: Secondary | ICD-10-CM | POA: Diagnosis not present

## 2020-04-05 DIAGNOSIS — K219 Gastro-esophageal reflux disease without esophagitis: Secondary | ICD-10-CM

## 2020-04-05 DIAGNOSIS — R531 Weakness: Secondary | ICD-10-CM

## 2020-04-05 DIAGNOSIS — R262 Difficulty in walking, not elsewhere classified: Secondary | ICD-10-CM | POA: Diagnosis not present

## 2020-04-07 DIAGNOSIS — M79604 Pain in right leg: Secondary | ICD-10-CM | POA: Diagnosis not present

## 2020-04-07 DIAGNOSIS — M545 Low back pain, unspecified: Secondary | ICD-10-CM | POA: Diagnosis not present

## 2020-04-07 DIAGNOSIS — R262 Difficulty in walking, not elsewhere classified: Secondary | ICD-10-CM | POA: Diagnosis not present

## 2020-04-07 DIAGNOSIS — M6281 Muscle weakness (generalized): Secondary | ICD-10-CM | POA: Diagnosis not present

## 2020-04-07 DIAGNOSIS — M2569 Stiffness of other specified joint, not elsewhere classified: Secondary | ICD-10-CM | POA: Diagnosis not present

## 2020-04-11 DIAGNOSIS — M2569 Stiffness of other specified joint, not elsewhere classified: Secondary | ICD-10-CM | POA: Diagnosis not present

## 2020-04-11 DIAGNOSIS — M6281 Muscle weakness (generalized): Secondary | ICD-10-CM | POA: Diagnosis not present

## 2020-04-11 DIAGNOSIS — M79604 Pain in right leg: Secondary | ICD-10-CM | POA: Diagnosis not present

## 2020-04-11 DIAGNOSIS — M545 Low back pain, unspecified: Secondary | ICD-10-CM | POA: Diagnosis not present

## 2020-04-11 DIAGNOSIS — R262 Difficulty in walking, not elsewhere classified: Secondary | ICD-10-CM | POA: Diagnosis not present

## 2020-04-14 DIAGNOSIS — R262 Difficulty in walking, not elsewhere classified: Secondary | ICD-10-CM | POA: Diagnosis not present

## 2020-04-14 DIAGNOSIS — M2569 Stiffness of other specified joint, not elsewhere classified: Secondary | ICD-10-CM | POA: Diagnosis not present

## 2020-04-14 DIAGNOSIS — M545 Low back pain, unspecified: Secondary | ICD-10-CM | POA: Diagnosis not present

## 2020-04-14 DIAGNOSIS — M79604 Pain in right leg: Secondary | ICD-10-CM | POA: Diagnosis not present

## 2020-04-14 DIAGNOSIS — M6281 Muscle weakness (generalized): Secondary | ICD-10-CM | POA: Diagnosis not present

## 2020-04-19 ENCOUNTER — Ambulatory Visit: Payer: PPO | Admitting: Cardiology

## 2020-04-19 DIAGNOSIS — M2569 Stiffness of other specified joint, not elsewhere classified: Secondary | ICD-10-CM | POA: Diagnosis not present

## 2020-04-19 DIAGNOSIS — M6281 Muscle weakness (generalized): Secondary | ICD-10-CM | POA: Diagnosis not present

## 2020-04-19 DIAGNOSIS — R262 Difficulty in walking, not elsewhere classified: Secondary | ICD-10-CM | POA: Diagnosis not present

## 2020-04-19 DIAGNOSIS — M79604 Pain in right leg: Secondary | ICD-10-CM | POA: Diagnosis not present

## 2020-04-19 DIAGNOSIS — M545 Low back pain, unspecified: Secondary | ICD-10-CM | POA: Diagnosis not present

## 2020-04-21 DIAGNOSIS — M6281 Muscle weakness (generalized): Secondary | ICD-10-CM | POA: Diagnosis not present

## 2020-04-21 DIAGNOSIS — M2569 Stiffness of other specified joint, not elsewhere classified: Secondary | ICD-10-CM | POA: Diagnosis not present

## 2020-04-21 DIAGNOSIS — M79604 Pain in right leg: Secondary | ICD-10-CM | POA: Diagnosis not present

## 2020-04-21 DIAGNOSIS — M545 Low back pain, unspecified: Secondary | ICD-10-CM | POA: Diagnosis not present

## 2020-04-21 DIAGNOSIS — R262 Difficulty in walking, not elsewhere classified: Secondary | ICD-10-CM | POA: Diagnosis not present

## 2020-04-25 ENCOUNTER — Other Ambulatory Visit: Payer: Self-pay

## 2020-04-25 ENCOUNTER — Ambulatory Visit (HOSPITAL_COMMUNITY)
Admission: RE | Admit: 2020-04-25 | Discharge: 2020-04-25 | Disposition: A | Discharge status: Home / Self Care | Payer: Medicare Other | Source: Ambulatory Visit | Attending: Family Medicine | Admitting: Family Medicine

## 2020-04-25 DIAGNOSIS — Z1231 Encounter for screening mammogram for malignant neoplasm of breast: Secondary | ICD-10-CM | POA: Diagnosis not present

## 2020-08-09 ENCOUNTER — Other Ambulatory Visit: Payer: Self-pay

## 2020-08-09 ENCOUNTER — Ambulatory Visit: Payer: Medicare Other | Admitting: Podiatry

## 2020-08-09 ENCOUNTER — Ambulatory Visit (INDEPENDENT_AMBULATORY_CARE_PROVIDER_SITE_OTHER): Payer: Medicare Other

## 2020-08-09 DIAGNOSIS — M79671 Pain in right foot: Secondary | ICD-10-CM

## 2020-08-09 DIAGNOSIS — M79672 Pain in left foot: Secondary | ICD-10-CM | POA: Diagnosis not present

## 2020-08-09 DIAGNOSIS — M84375A Stress fracture, left foot, initial encounter for fracture: Secondary | ICD-10-CM | POA: Diagnosis not present

## 2020-08-09 DIAGNOSIS — M778 Other enthesopathies, not elsewhere classified: Secondary | ICD-10-CM | POA: Diagnosis not present

## 2020-08-09 DIAGNOSIS — M7989 Other specified soft tissue disorders: Secondary | ICD-10-CM | POA: Diagnosis not present

## 2020-08-09 MED ORDER — METHYLPREDNISOLONE 4 MG PO TBPK: ORAL_TABLET | ORAL | 0 refills | Status: DC

## 2020-08-09 MED ORDER — MELOXICAM 15 MG PO TABS: 15.0000 mg | ORAL_TABLET | Freq: Every day | ORAL | 3 refills | Status: DC | PRN

## 2020-08-10 ENCOUNTER — Other Ambulatory Visit: Payer: Self-pay | Admitting: Podiatry

## 2020-08-10 DIAGNOSIS — M1990 Unspecified osteoarthritis, unspecified site: Secondary | ICD-10-CM

## 2020-08-11 ENCOUNTER — Encounter: Payer: Self-pay | Admitting: Podiatry

## 2020-08-11 NOTE — Progress Notes (Signed)
  Subjective:  Patient ID: Traci Chandler, female    DOB: [birth year],  MRN: [id number]  Chief Complaint  Patient presents with  . Foot Pain    Bilateral foot pain left foot is worse. PT stated that she has a throbbing pain under her toes     65 y.o. female returns with the above complaint. History confirmed with patient.  The right foot began in January and is subsiding, and the left side started on 08/06/20  Objective:  Physical Exam: warm, good capillary refill, no trophic changes or ulcerative lesions, normal DP and PT pulses and normal sensory exam. Distal metatarsal pain diffuse forefoot bilaterally with L > R with edema and erythema  Radiographs: X-ray of the right foot: multiple metatarsal cortical stress thickening and reactions  Assessment:   1. Pain in both feet   2. Swelling of left foot   3. Capsulitis of left foot   4. Stress fracture of left foot, initial encounter      Plan:  Patient was evaluated and treated and all questions answered.  Seems to be recurrent similar to prior pain. Her radiographs which I reviewed with her in detail seem to indicate multiple metatarsal stress reactions which is uncommon. I have ordered lab work to evaluate for an inflammatory systemic arthritis. I also think we should evaluate for stress fractures and arthritis as well with an MRI.     Return in about 3 weeks (around 08/30/2020).

## 2020-08-12 ENCOUNTER — Telehealth: Payer: Self-pay | Admitting: Podiatry

## 2020-08-12 DIAGNOSIS — M79672 Pain in left foot: Secondary | ICD-10-CM | POA: Diagnosis not present

## 2020-08-12 DIAGNOSIS — M7989 Other specified soft tissue disorders: Secondary | ICD-10-CM | POA: Diagnosis not present

## 2020-08-12 DIAGNOSIS — M79671 Pain in right foot: Secondary | ICD-10-CM | POA: Diagnosis not present

## 2020-08-12 NOTE — Telephone Encounter (Signed)
Diagnostic testing calling to request details about arthritis panel for patient. Please advise.

## 2020-08-13 LAB — COMPREHENSIVE METABOLIC PANEL
AG Ratio: 1.4 (calc) (ref 1.0–2.5)
ALT: 14 U/L (ref 6–29)
AST: 13 U/L (ref 10–35)
Albumin: 4.6 g/dL (ref 3.6–5.1)
Alkaline phosphatase (APISO): 62 U/L (ref 37–153)
BUN/Creatinine Ratio: 24 (calc) — ABNORMAL HIGH (ref 6–22)
BUN: 33 mg/dL — ABNORMAL HIGH (ref 7–25)
CO2: 26 mmol/L (ref 20–32)
Calcium: 10.6 mg/dL — ABNORMAL HIGH (ref 8.6–10.4)
Chloride: 102 mmol/L (ref 98–110)
Creat: 1.39 mg/dL — ABNORMAL HIGH (ref 0.50–0.99)
Globulin: 3.4 g/dL (calc) (ref 1.9–3.7)
Glucose, Bld: 81 mg/dL (ref 65–99)
Potassium: 4.9 mmol/L (ref 3.5–5.3)
Sodium: 137 mmol/L (ref 135–146)
Total Bilirubin: 0.3 mg/dL (ref 0.2–1.2)
Total Protein: 8 g/dL (ref 6.1–8.1)

## 2020-08-13 LAB — CBC WITH DIFFERENTIAL/PLATELET
Absolute Monocytes: 437 cells/uL (ref 200–950)
Basophils Absolute: 18 cells/uL (ref 0–200)
Basophils Relative: 0.3 %
Eosinophils Absolute: 59 cells/uL (ref 15–500)
Eosinophils Relative: 1 %
HCT: 45 % (ref 35.0–45.0)
Hemoglobin: 15 g/dL (ref 11.7–15.5)
Lymphs Abs: 1634 cells/uL (ref 850–3900)
MCH: 29.6 pg (ref 27.0–33.0)
MCHC: 33.3 g/dL (ref 32.0–36.0)
MCV: 88.9 fL (ref 80.0–100.0)
MPV: 10.6 fL (ref 7.5–12.5)
Monocytes Relative: 7.4 %
Neutro Abs: 3752 cells/uL (ref 1500–7800)
Neutrophils Relative %: 63.6 %
Platelets: 185 10*3/uL (ref 140–400)
RBC: 5.06 10*6/uL (ref 3.80–5.10)
RDW: 12.7 % (ref 11.0–15.0)
Total Lymphocyte: 27.7 %
WBC: 5.9 10*3/uL (ref 3.8–10.8)

## 2020-08-13 LAB — C-REACTIVE PROTEIN: CRP: 6.7 mg/L (ref <8.0)

## 2020-08-13 LAB — SEDIMENTATION RATE: Sed Rate: 28 mm/h (ref 0–30)

## 2020-08-15 NOTE — Telephone Encounter (Signed)
Is there a call back #? What was their question? It looks like they didn't draw the lab.

## 2020-08-18 ENCOUNTER — Ambulatory Visit
Admission: RE | Admit: 2020-08-18 | Discharge: 2020-08-18 | Disposition: A | Discharge status: Home / Self Care | Payer: Medicare Other | Source: Ambulatory Visit | Attending: Podiatry | Admitting: Podiatry

## 2020-08-18 DIAGNOSIS — M79672 Pain in left foot: Secondary | ICD-10-CM

## 2020-08-18 DIAGNOSIS — R600 Localized edema: Secondary | ICD-10-CM | POA: Diagnosis not present

## 2020-08-18 DIAGNOSIS — M778 Other enthesopathies, not elsewhere classified: Secondary | ICD-10-CM

## 2020-08-18 DIAGNOSIS — M79671 Pain in right foot: Secondary | ICD-10-CM

## 2020-08-18 DIAGNOSIS — M84375A Stress fracture, left foot, initial encounter for fracture: Secondary | ICD-10-CM

## 2020-08-18 DIAGNOSIS — M25475 Effusion, left foot: Secondary | ICD-10-CM | POA: Diagnosis not present

## 2020-08-18 DIAGNOSIS — M7989 Other specified soft tissue disorders: Secondary | ICD-10-CM

## 2020-08-19 ENCOUNTER — Encounter: Payer: Self-pay | Admitting: Podiatry

## 2020-08-30 ENCOUNTER — Ambulatory Visit: Payer: Medicare Other | Admitting: Podiatry

## 2020-08-30 ENCOUNTER — Other Ambulatory Visit: Payer: Self-pay

## 2020-08-30 ENCOUNTER — Encounter: Payer: Self-pay | Admitting: Podiatry

## 2020-08-30 DIAGNOSIS — M7989 Other specified soft tissue disorders: Secondary | ICD-10-CM

## 2020-08-30 DIAGNOSIS — M778 Other enthesopathies, not elsewhere classified: Secondary | ICD-10-CM | POA: Diagnosis not present

## 2020-08-30 DIAGNOSIS — M1A0790 Idiopathic chronic gout, unspecified ankle and foot, without tophus (tophi): Secondary | ICD-10-CM

## 2020-08-30 DIAGNOSIS — M79672 Pain in left foot: Secondary | ICD-10-CM

## 2020-08-30 DIAGNOSIS — M84375A Stress fracture, left foot, initial encounter for fracture: Secondary | ICD-10-CM

## 2020-08-30 DIAGNOSIS — M79671 Pain in right foot: Secondary | ICD-10-CM | POA: Diagnosis not present

## 2020-08-30 DIAGNOSIS — M84374A Stress fracture, right foot, initial encounter for fracture: Secondary | ICD-10-CM

## 2020-08-30 NOTE — Patient Instructions (Signed)

## 2020-08-30 NOTE — Progress Notes (Signed)
Subjective:  Patient ID: Traci Chandler, female    DOB: 03/05/1956,  MRN: [id number]  Chief Complaint  Patient presents with  . Foot Pain    Pt stated that she is doing well she stated that overall her pain is better    65 y.o. female returns with the above complaint. History confirmed with patient.  She is here for follow-up after her MRI and lab work.  She is doing much better than she was Objective:  Physical Exam: warm, good capillary refill, no trophic changes or ulcerative lesions, normal DP and PT pulses and normal sensory exam.  Minimal to no pain today bilaterally  Radiographs: X-ray of the right foot: multiple metatarsal cortical stress thickening and reactions   Study Result  Narrative & Impression  CLINICAL DATA:  Chronic left foot pain  EXAM: MRI OF THE LEFT FOOT WITHOUT CONTRAST  TECHNIQUE: Multiplanar, multisequence MR imaging of the left forefoot was performed. No intravenous contrast was administered.  COMPARISON:  X-ray 08/09/2020  FINDINGS: Bones/Joint/Cartilage  No acute fracture. No dislocation. Cortical thickening of the second, third, and fourth metatarsal diaphyses. Mild periosteal edema associated with the second metatarsal diaphysis. No intracortical signal changes. No bone marrow edema. Joint spaces of the forefoot are relatively well preserved. No focal erosion. Trace effusions of the first through fifth MTP joints, nonspecific.  Ligaments  Intact Lisfranc ligament. Collateral ligaments of the forefoot are intact. No evidence of plantar plate disruption.  Muscles and Tendons  Intact flexor and extensor tendons. Preserved muscle bulk and signal.  Soft tissues  Small volume first, second, and third intermetatarsal space fluid. No intermetatarsal space mass. No soft tissue ulceration. No fluid collection.  IMPRESSION: 1. Cortical thickening of the second, third, and fourth metatarsal diaphyses with mild periosteal edema  associated with the second metatarsal diaphysis. Findings likely reflecting changes related to ongoing stress reaction. No fracture. 2. Trace effusions of the first through fifth MTP joints, nonspecific. 3. Small volume first, second, and third intermetatarsal space fluid, which may reflect mild bursitis.   Electronically Signed   By: Duanne Guess D.O.   On: 08/19/2020 13:19   Lab work 08/12/2020 Sed rate 28 CRP 6.7 Creatinine 1.39 WBC 5.9 Assessment:   1. Chronic gout of foot, unspecified cause, unspecified laterality   2. Pain in both feet   3. Swelling of left foot   4. Capsulitis of left foot   5. Stress fracture of left foot, initial encounter   6. Stress fracture of metatarsal bone of right foot, initial encounter      Plan:  Patient was evaluated and treated and all questions answered.  I reviewed the MRI findings as well as lab work with the patient.  We discussed that unfortunately they did not draw the arthritis panel and therefore her uric acid was not checked.  Discussed with her that her radiographs and MRI both showed cortical stress reactions without active stress fracture.  I think likely this is a more chronic issue that has waxed and waned in lead to cortical thickening, I do not think she has actually active stress fracture that we need to worry about at this point.  I think most likely etiology is still gout.  I think we should take his precautions to prevent this so we discussed a low purine eating plan and I gave her information on this.  I also gave her a standing order for uric acid test to be done immediately if she begins to have her pain  come back and have a flare and should see me again to evaluate.  Discussed treatments for both acute and chronic gout   Return if symptoms worsen or fail to improve, for if gout flare recurs.

## 2020-11-28 ENCOUNTER — Ambulatory Visit
Admission: EM | Admit: 2020-11-28 | Discharge: 2020-11-28 | Disposition: A | Discharge status: Home / Self Care | Payer: Medicare Other | Attending: Family Medicine | Admitting: Family Medicine

## 2020-11-28 ENCOUNTER — Telehealth: Payer: Self-pay | Admitting: Podiatry

## 2020-11-28 ENCOUNTER — Other Ambulatory Visit: Payer: Self-pay

## 2020-11-28 ENCOUNTER — Encounter: Payer: Self-pay | Admitting: Emergency Medicine

## 2020-11-28 DIAGNOSIS — M25572 Pain in left ankle and joints of left foot: Secondary | ICD-10-CM

## 2020-11-28 MED ORDER — DEXAMETHASONE SODIUM PHOSPHATE 10 MG/ML IJ SOLN
10.0000 mg | Freq: Once | INTRAMUSCULAR | Status: AC
  Administered 2020-11-28: 10 mg via INTRAMUSCULAR

## 2020-11-28 MED ORDER — TIZANIDINE HCL 2 MG PO TABS: 2.0000 mg | ORAL_TABLET | Freq: Three times a day (TID) | ORAL | 0 refills | Status: DC | PRN

## 2020-11-28 MED ORDER — PREDNISONE 20 MG PO TABS: 40.0000 mg | ORAL_TABLET | Freq: Every day | ORAL | 0 refills | Status: AC

## 2020-11-28 NOTE — Telephone Encounter (Signed)
Patient called and stated she was having issues with her foot. She is having severe pain and wanted to know if you could prescribed some prednisone or should she go to urgent care. Please advise

## 2020-11-28 NOTE — ED Provider Notes (Signed)
[facility]    CSN: [id number] Arrival date & time: 11/28/20  1626      History   Chief Complaint No chief complaint on file.   HPI Traci Chandler is a 65 y.o. female.   HPI Patient with a known history of recurrent gout and stress fracture involving the left ankle and foot presents today with a week of left foot and ankle pain.  She is followed by [facility].  She reports that she has had to ambulate in a cam boot over the last week due to the pain. She is requesting a brief trial of steroids as this is helped in the past.  Denies any recent injury.  She has a follow-up scheduled with podiatry in 1 week.  Past Medical History:  Diagnosis Date   Essential hypertension    GERD (gastroesophageal reflux disease)    Hypertrophic non-obstructive cardiomyopathy (HCC)    Echocardiogram 2011    Patient Active Problem List   Diagnosis Date Noted   GERD (gastroesophageal reflux disease) 01/14/2018   Abdominal pain 01/14/2018   Weakness 01/14/2018   Encounter for screening colonoscopy 04/02/2013   Hypertrophic cardiomyopathy (HCC) 09/12/2011   Essential hypertension, benign 09/12/2011    Past Surgical History:  Procedure Laterality Date   ABDOMINAL HYSTERECTOMY     COLONOSCOPY N/A 04/15/2013   Procedure: COLONOSCOPY;  Surgeon: West Bali, MD;  Location: [facility];  Service: Endoscopy;  Laterality: N/A;  9:30    OB History   No obstetric history on file.      Home Medications    Prior to Admission medications   Medication Sig Start Date End Date Taking? Authorizing Provider  predniSONE (DELTASONE) 20 MG tablet Take 2 tablets (40 mg total) by mouth daily with breakfast for 5 days. 11/29/20 12/04/20 Yes Bing Neighbors, FNP  tiZANidine (ZANAFLEX) 2 MG tablet Take 1-2 tablets (2-4 mg total) by mouth every 8 (eight) hours as needed for muscle spasms. 11/28/20  Yes Bing Neighbors, FNP  aspirin 81 MG tablet Take 81 mg by mouth every  evening.     [provider]  atenolol (TENORMIN) 50 MG tablet Take 1 tablet by mouth daily. 02/11/18   [provider]  meloxicam (MOBIC) 15 MG tablet Take 1 tablet (15 mg total) by mouth daily as needed for pain. 08/16/20   McDonald, Rachelle Hora, DPM  NIFEdipine (PROCARDIA XL/NIFEDICAL XL) 60 MG 24 hr tablet Take 60 mg by mouth daily. 07/09/19   [provider]  omeprazole (PRILOSEC) 20 MG capsule TAKE 1 CAPSULE 2 TIMES A DAY BEFORE A MEAL. 08/10/19   Anice Paganini, NP  spironolactone (ALDACTONE) 25 MG tablet Take 25 mg by mouth 2 (two) times daily.    [provider]  Turmeric 500 MG CAPS Take by mouth.    [provider]    Family History Family History  Problem Relation Age of Onset   Diabetes type II Mother    Hypertension Mother    Diabetes type II Sister    Hypertension Sister    Prostate cancer Father    Colon cancer Neg Hx     Social History Social History   Tobacco Use   Smoking status: Never   Smokeless tobacco: Never  Vaping Use   Vaping Use: Never used  Substance Use Topics   Alcohol use: No   Drug use: No     Allergies   Codeine, Diclofenac, and Lisinopril  Review of Systems Review of Systems Pertinent negatives listed in HPI   Physical Exam Triage Vital Signs ED Triage Vitals  Enc Vitals Group     BP 11/28/20 1730 136/77     Pulse Rate 11/28/20 1730 78     Resp 11/28/20 1730 16     Temp 11/28/20 1730 (!) 97.5 F (36.4 C)     Temp Source 11/28/20 1730 Temporal     SpO2 11/28/20 1730 97 %     Weight --      Height --      Head Circumference --      Peak Flow --      Pain Score 11/28/20 1732 10     Pain Loc --      Pain Edu? --      Excl. in GC? --    No data found.  Updated Vital Signs BP 136/77 (BP Location: Right Arm)   Pulse 78   Temp (!) 97.5 F (36.4 C) (Temporal)   Resp 16   SpO2 97%   Visual Acuity Right Eye Distance:   Left Eye Distance:   Bilateral Distance:    Right Eye Near:    Left Eye Near:    Bilateral Near:     Physical Exam HENT:     Head: Normocephalic.  Cardiovascular:     Rate and Rhythm: Normal rate and regular rhythm.  Pulmonary:     Effort: Pulmonary effort is normal.     Breath sounds: Normal breath sounds.  Musculoskeletal:        General: Tenderness present.     Left lower leg: Edema present.  Skin:    General: Skin is warm.     Capillary Refill: Capillary refill takes less than 2 seconds.  Neurological:     Mental Status: She is alert.     Gait: Gait abnormal.     Comments: Ankle and foot immobilizer of the left extremity use for weightbearing  Psychiatric:        Mood and Affect: Mood normal.        Behavior: Behavior normal.        Thought Content: Thought content normal.        Judgment: Judgment normal.     UC Treatments / Results  Labs (all labs ordered are listed, but only abnormal results are displayed) Labs Reviewed - No data to display  EKG   Radiology No results found.  Procedures Procedures (including critical care time)  Medications Ordered in UC Medications  dexamethasone (DECADRON) injection 10 mg (has no administration in time range)    Initial Impression / Assessment and Plan / UC Course  I have reviewed the triage vital signs and the nursing notes.  Pertinent labs & imaging results that were available during my care of the patient were reviewed by me and considered in my medical decision making (see chart for details).     Recurrent left foot and ankle pain related to a stress fracture and history of gout. Treating prednisone 40 mg daily x5 days.  For acute pain tizanidine 1 to 2 tablets as needed.  Precautions given that tizanidine can cause profound drowsiness therefore avoid driving while taking medication.  Patient advised to follow-up with her podiatrist as scheduled. Final Clinical Impressions(s) / UC Diagnoses   Final diagnoses:  Pain of joint of left ankle and foot   Discharge  Instructions   None    ED Prescriptions     Medication Sig Dispense Auth.  Provider   predniSONE (DELTASONE) 20 MG tablet Take 2 tablets (40 mg total) by mouth daily with breakfast for 5 days. 10 tablet Bing Neighbors, FNP   tiZANidine (ZANAFLEX) 2 MG tablet Take 1-2 tablets (2-4 mg total) by mouth every 8 (eight) hours as needed for muscle spasms. 20 tablet Bing Neighbors, FNP      PDMP not reviewed this encounter.   Bing Neighbors, FNP 12/03/20 1345

## 2020-11-28 NOTE — ED Triage Notes (Signed)
Pain in left foot x 3 weeks.  X-rays and MRI showed a stress fracture.  Pain in foot and back of ankle.

## 2020-12-06 ENCOUNTER — Other Ambulatory Visit: Payer: Self-pay

## 2020-12-06 ENCOUNTER — Ambulatory Visit: Payer: Medicare Other | Admitting: Podiatry

## 2020-12-06 DIAGNOSIS — M1A0790 Idiopathic chronic gout, unspecified ankle and foot, without tophus (tophi): Secondary | ICD-10-CM | POA: Diagnosis not present

## 2020-12-06 MED ORDER — PREDNISONE 10 MG PO TABS: 40.0000 mg | ORAL_TABLET | Freq: Every day | ORAL | 0 refills | Status: AC

## 2020-12-06 MED ORDER — DOCUSATE SODIUM 100 MG PO CAPS: 100.0000 mg | ORAL_CAPSULE | Freq: Every day | ORAL | 0 refills | Status: DC | PRN

## 2020-12-08 NOTE — Progress Notes (Signed)
  Subjective:  Patient ID: Traci Chandler, female    DOB: 1955-11-02,  MRN: [id number]  Chief Complaint  Patient presents with   Gout    Follow up left foot and ankle pain. Pt states she went to an Urgent care last Monday and received an injection for pain that helped.     65 y.o. female returns with the above complaint. History confirmed with patient.  Her pain returned last week and she went to urgent care and they gave her prednisone.  Is feeling little better now. Objective:  Physical Exam: warm, good capillary refill, no trophic changes or ulcerative lesions, normal DP and PT pulses and normal sensory exam.  Minimal to no pain today bilaterally  Radiographs: X-ray of the right foot: multiple metatarsal cortical stress thickening and reactions   Study Result  Narrative & Impression  CLINICAL DATA:  Chronic left foot pain   EXAM: MRI OF THE LEFT FOOT WITHOUT CONTRAST   TECHNIQUE: Multiplanar, multisequence MR imaging of the left forefoot was performed. No intravenous contrast was administered.   COMPARISON:  X-ray 08/09/2020   FINDINGS: Bones/Joint/Cartilage   No acute fracture. No dislocation. Cortical thickening of the second, third, and fourth metatarsal diaphyses. Mild periosteal edema associated with the second metatarsal diaphysis. No intracortical signal changes. No bone marrow edema. Joint spaces of the forefoot are relatively well preserved. No focal erosion. Trace effusions of the first through fifth MTP joints, nonspecific.   Ligaments   Intact Lisfranc ligament. Collateral ligaments of the forefoot are intact. No evidence of plantar plate disruption.   Muscles and Tendons   Intact flexor and extensor tendons. Preserved muscle bulk and signal.   Soft tissues   Small volume first, second, and third intermetatarsal space fluid. No intermetatarsal space mass. No soft tissue ulceration. No fluid collection.   IMPRESSION: 1. Cortical thickening of the  second, third, and fourth metatarsal diaphyses with mild periosteal edema associated with the second metatarsal diaphysis. Findings likely reflecting changes related to ongoing stress reaction. No fracture. 2. Trace effusions of the first through fifth MTP joints, nonspecific. 3. Small volume first, second, and third intermetatarsal space fluid, which may reflect mild bursitis.     Electronically Signed   By: Duanne Guess D.O.   On: 08/19/2020 13:19   Lab work 08/12/2020 Sed rate 28 CRP 6.7 Creatinine 1.39 WBC 5.9 Assessment:   1. Chronic gout of foot, unspecified cause, unspecified laterality      Plan:  Patient was evaluated and treated and all questions answered.  I do think she is still having chronic recurrent gout flares.  I discussed with her the possibilities that could be causing this including purine rich foods.  She mentioned that she has had problems with constipation chronically and takes MiraLAX daily which has given her diarrhea and soft loose stools recently.  I wonder if she has had enough diarrhea to cause dehydration and possible decreased renal function leading to gout flares.  Recommend she switch to Colace which I gave her a prescription for until she can see her PCP in August.  She also discussed this with her PCP and see what the best thing going forward would be for her to be on for both gout and the constipation and diarrhea.  Prednisone prescription given her so she has 1 on hand at home in case she has another flare   Return if symptoms worsen or fail to improve.

## 2021-01-17 DIAGNOSIS — R7309 Other abnormal glucose: Secondary | ICD-10-CM | POA: Diagnosis not present

## 2021-01-17 DIAGNOSIS — Z0000 Encounter for general adult medical examination without abnormal findings: Secondary | ICD-10-CM | POA: Diagnosis not present

## 2021-01-17 DIAGNOSIS — I10 Essential (primary) hypertension: Secondary | ICD-10-CM | POA: Diagnosis not present

## 2021-01-24 DIAGNOSIS — K219 Gastro-esophageal reflux disease without esophagitis: Secondary | ICD-10-CM | POA: Diagnosis not present

## 2021-01-24 DIAGNOSIS — N1831 Chronic kidney disease, stage 3a: Secondary | ICD-10-CM | POA: Diagnosis not present

## 2021-01-24 DIAGNOSIS — I129 Hypertensive chronic kidney disease with stage 1 through stage 4 chronic kidney disease, or unspecified chronic kidney disease: Secondary | ICD-10-CM | POA: Diagnosis not present

## 2021-01-24 DIAGNOSIS — M79671 Pain in right foot: Secondary | ICD-10-CM | POA: Diagnosis not present

## 2021-01-24 DIAGNOSIS — Z0000 Encounter for general adult medical examination without abnormal findings: Secondary | ICD-10-CM | POA: Diagnosis not present

## 2021-01-24 DIAGNOSIS — I422 Other hypertrophic cardiomyopathy: Secondary | ICD-10-CM | POA: Diagnosis not present

## 2021-03-02 ENCOUNTER — Emergency Department (HOSPITAL_COMMUNITY): Payer: Medicare Other

## 2021-03-02 ENCOUNTER — Encounter: Payer: Self-pay | Admitting: Emergency Medicine

## 2021-03-02 ENCOUNTER — Ambulatory Visit
Admission: EM | Admit: 2021-03-02 | Discharge: 2021-03-02 | Disposition: A | Discharge status: Home / Self Care | Payer: Medicare Other

## 2021-03-02 ENCOUNTER — Other Ambulatory Visit: Payer: Self-pay

## 2021-03-02 ENCOUNTER — Emergency Department (HOSPITAL_COMMUNITY)
Admission: EM | Admit: 2021-03-02 | Discharge: 2021-03-03 | DRG: 445 | Disposition: A | Discharge status: Home / Self Care | Payer: Medicare Other | Attending: Internal Medicine | Admitting: Internal Medicine

## 2021-03-02 ENCOUNTER — Encounter (HOSPITAL_COMMUNITY): Payer: Self-pay

## 2021-03-02 DIAGNOSIS — K8050 Calculus of bile duct without cholangitis or cholecystitis without obstruction: Secondary | ICD-10-CM | POA: Diagnosis not present

## 2021-03-02 DIAGNOSIS — K449 Diaphragmatic hernia without obstruction or gangrene: Secondary | ICD-10-CM | POA: Insufficient documentation

## 2021-03-02 DIAGNOSIS — R52 Pain, unspecified: Secondary | ICD-10-CM

## 2021-03-02 DIAGNOSIS — R112 Nausea with vomiting, unspecified: Secondary | ICD-10-CM

## 2021-03-02 DIAGNOSIS — Z885 Allergy status to narcotic agent status: Secondary | ICD-10-CM | POA: Diagnosis not present

## 2021-03-02 DIAGNOSIS — N182 Chronic kidney disease, stage 2 (mild): Secondary | ICD-10-CM | POA: Diagnosis present

## 2021-03-02 DIAGNOSIS — N281 Cyst of kidney, acquired: Secondary | ICD-10-CM | POA: Diagnosis not present

## 2021-03-02 DIAGNOSIS — I129 Hypertensive chronic kidney disease with stage 1 through stage 4 chronic kidney disease, or unspecified chronic kidney disease: Secondary | ICD-10-CM | POA: Diagnosis not present

## 2021-03-02 DIAGNOSIS — Z7982 Long term (current) use of aspirin: Secondary | ICD-10-CM

## 2021-03-02 DIAGNOSIS — Z20822 Contact with and (suspected) exposure to covid-19: Secondary | ICD-10-CM | POA: Diagnosis present

## 2021-03-02 DIAGNOSIS — K838 Other specified diseases of biliary tract: Secondary | ICD-10-CM | POA: Diagnosis not present

## 2021-03-02 DIAGNOSIS — K8020 Calculus of gallbladder without cholecystitis without obstruction: Secondary | ICD-10-CM | POA: Insufficient documentation

## 2021-03-02 DIAGNOSIS — Z791 Long term (current) use of non-steroidal anti-inflammatories (NSAID): Secondary | ICD-10-CM

## 2021-03-02 DIAGNOSIS — R1013 Epigastric pain: Secondary | ICD-10-CM

## 2021-03-02 DIAGNOSIS — Z888 Allergy status to other drugs, medicaments and biological substances status: Secondary | ICD-10-CM

## 2021-03-02 DIAGNOSIS — Z79899 Other long term (current) drug therapy: Secondary | ICD-10-CM | POA: Diagnosis not present

## 2021-03-02 DIAGNOSIS — I422 Other hypertrophic cardiomyopathy: Secondary | ICD-10-CM | POA: Diagnosis present

## 2021-03-02 DIAGNOSIS — R109 Unspecified abdominal pain: Secondary | ICD-10-CM | POA: Diagnosis not present

## 2021-03-02 DIAGNOSIS — I700 Atherosclerosis of aorta: Secondary | ICD-10-CM | POA: Diagnosis not present

## 2021-03-02 DIAGNOSIS — K5730 Diverticulosis of large intestine without perforation or abscess without bleeding: Secondary | ICD-10-CM | POA: Diagnosis not present

## 2021-03-02 LAB — COMPREHENSIVE METABOLIC PANEL
ALT: 16 U/L (ref 0–44)
AST: 20 U/L (ref 15–41)
Albumin: 4.7 g/dL (ref 3.5–5.0)
Alkaline Phosphatase: 71 U/L (ref 38–126)
Anion gap: 9 (ref 5–15)
BUN: 21 mg/dL (ref 8–23)
CO2: 24 mmol/L (ref 22–32)
Calcium: 9.6 mg/dL (ref 8.9–10.3)
Chloride: 100 mmol/L (ref 98–111)
Creatinine, Ser: 1.23 mg/dL — ABNORMAL HIGH (ref 0.44–1.00)
GFR, Estimated: 48 mL/min — ABNORMAL LOW (ref >60)
Glucose, Bld: 151 mg/dL — ABNORMAL HIGH (ref 70–99)
Potassium: 4.5 mmol/L (ref 3.5–5.1)
Sodium: 133 mmol/L — ABNORMAL LOW (ref 135–145)
Total Bilirubin: 0.5 mg/dL (ref 0.3–1.2)
Total Protein: 8.9 g/dL — ABNORMAL HIGH (ref 6.5–8.1)

## 2021-03-02 LAB — CBC
HCT: 46.1 % — ABNORMAL HIGH (ref 36.0–46.0)
Hemoglobin: 15 g/dL (ref 12.0–15.0)
MCH: 29.9 pg (ref 26.0–34.0)
MCHC: 32.5 g/dL (ref 30.0–36.0)
MCV: 92 fL (ref 80.0–100.0)
Platelets: 171 10*3/uL (ref 150–400)
RBC: 5.01 MIL/uL (ref 3.87–5.11)
RDW: 12.7 % (ref 11.5–15.5)
WBC: 7.2 10*3/uL (ref 4.0–10.5)
nRBC: 0 % (ref 0.0–0.2)

## 2021-03-02 LAB — URINALYSIS, ROUTINE W REFLEX MICROSCOPIC
Bilirubin Urine: NEGATIVE
Glucose, UA: NEGATIVE mg/dL
Ketones, ur: NEGATIVE mg/dL
Nitrite: NEGATIVE
Protein, ur: 100 mg/dL — AB
Specific Gravity, Urine: 1.026 (ref 1.005–1.030)
WBC, UA: >50 WBC/hpf — ABNORMAL HIGH (ref 0–5)
pH: 6 (ref 5.0–8.0)

## 2021-03-02 LAB — RESP PANEL BY RT-PCR (FLU A&B, COVID) ARPGX2
Influenza A by PCR: NEGATIVE
Influenza B by PCR: NEGATIVE
SARS Coronavirus 2 by RT PCR: NEGATIVE

## 2021-03-02 LAB — TROPONIN I (HIGH SENSITIVITY)
Troponin I (High Sensitivity): 6 ng/L (ref <18)
Troponin I (High Sensitivity): 7 ng/L (ref <18)

## 2021-03-02 LAB — LIPASE, BLOOD: Lipase: 28 U/L (ref 11–51)

## 2021-03-02 MED ORDER — ASPIRIN EC 81 MG PO TBEC: 81.0000 mg | DELAYED_RELEASE_TABLET | Freq: Every evening | ORAL | Status: DC

## 2021-03-02 MED ORDER — IOHEXOL 350 MG/ML SOLN: 100.0000 mL | Freq: Once | INTRAVENOUS | Status: DC | PRN

## 2021-03-02 MED ORDER — IOHEXOL 300 MG/ML  SOLN
100.0000 mL | Freq: Once | INTRAMUSCULAR | Status: AC | PRN
  Administered 2021-03-02: 100 mL via INTRAVENOUS

## 2021-03-02 MED ORDER — SODIUM CHLORIDE 0.9 % IV BOLUS
1000.0000 mL | Freq: Once | INTRAVENOUS | Status: AC
  Administered 2021-03-02: 1000 mL via INTRAVENOUS

## 2021-03-02 MED ORDER — SODIUM CHLORIDE 0.9 % IV SOLN
12.5000 mg | Freq: Once | INTRAVENOUS | Status: AC
  Administered 2021-03-02: 12.5 mg via INTRAVENOUS
  Filled 2021-03-02: qty 0.5

## 2021-03-02 MED ORDER — HYDROMORPHONE HCL 1 MG/ML IJ SOLN
0.5000 mg | INTRAMUSCULAR | Status: DC | PRN
Start: 2021-03-02 — End: 2021-03-03
  Administered 2021-03-02: 0.5 mg via INTRAVENOUS
  Filled 2021-03-02: qty 1

## 2021-03-02 MED ORDER — MORPHINE SULFATE (PF) 4 MG/ML IV SOLN
4.0000 mg | Freq: Once | INTRAVENOUS | Status: AC
Start: 2021-03-02 — End: 2021-03-02
  Administered 2021-03-02: 4 mg via INTRAVENOUS
  Filled 2021-03-02 (×2): qty 1

## 2021-03-02 MED ORDER — PANTOPRAZOLE SODIUM 40 MG PO TBEC
40.0000 mg | DELAYED_RELEASE_TABLET | Freq: Every day | ORAL | Status: DC
  Administered 2021-03-02 – 2021-03-03 (×2): 40 mg via ORAL
  Filled 2021-03-02 (×2): qty 1

## 2021-03-02 MED ORDER — ATENOLOL 25 MG PO TABS
50.0000 mg | ORAL_TABLET | Freq: Every day | ORAL | Status: DC
  Administered 2021-03-03: 50 mg via ORAL

## 2021-03-02 MED ORDER — ENOXAPARIN SODIUM 40 MG/0.4ML IJ SOSY
40.0000 mg | PREFILLED_SYRINGE | Freq: Every day | INTRAMUSCULAR | Status: DC
  Administered 2021-03-03: 40 mg via SUBCUTANEOUS
  Filled 2021-03-02: qty 0.4

## 2021-03-02 MED ORDER — ONDANSETRON HCL 4 MG/2ML IJ SOLN: 4.0000 mg | Freq: Four times a day (QID) | INTRAMUSCULAR | Status: DC | PRN

## 2021-03-02 MED ORDER — MORPHINE SULFATE (PF) 4 MG/ML IV SOLN
4.0000 mg | Freq: Once | INTRAVENOUS | Status: AC
Start: 2021-03-02 — End: 2021-03-02
  Administered 2021-03-02: 4 mg via INTRAVENOUS
  Filled 2021-03-02: qty 1

## 2021-03-02 MED ORDER — SPIRONOLACTONE 25 MG PO TABS
25.0000 mg | ORAL_TABLET | Freq: Two times a day (BID) | ORAL | Status: DC
  Administered 2021-03-03: 25 mg via ORAL
  Filled 2021-03-02: qty 1

## 2021-03-02 MED ORDER — NIFEDIPINE ER OSMOTIC RELEASE 30 MG PO TB24
60.0000 mg | ORAL_TABLET | Freq: Every day | ORAL | Status: DC
  Administered 2021-03-03: 60 mg via ORAL
  Filled 2021-03-02: qty 2

## 2021-03-02 MED ORDER — GADOBUTROL 1 MMOL/ML IV SOLN
10.0000 mL | Freq: Once | INTRAVENOUS | Status: AC | PRN
  Administered 2021-03-02: 10 mL via INTRAVENOUS

## 2021-03-02 MED ORDER — TIZANIDINE HCL 4 MG PO TABS: 2.0000 mg | ORAL_TABLET | Freq: Three times a day (TID) | ORAL | Status: DC | PRN

## 2021-03-02 MED ORDER — HYDRALAZINE HCL 20 MG/ML IJ SOLN: 5.0000 mg | Freq: Four times a day (QID) | INTRAMUSCULAR | Status: DC | PRN

## 2021-03-02 MED ORDER — ONDANSETRON HCL 4 MG/2ML IJ SOLN
4.0000 mg | Freq: Once | INTRAMUSCULAR | Status: AC
  Administered 2021-03-02: 4 mg via INTRAVENOUS
  Filled 2021-03-02: qty 2

## 2021-03-02 MED ORDER — ONDANSETRON HCL 4 MG PO TABS: 4.0000 mg | ORAL_TABLET | Freq: Four times a day (QID) | ORAL | Status: DC | PRN

## 2021-03-02 MED ORDER — SODIUM CHLORIDE 0.9 % IV SOLN: INTRAVENOUS | Status: AC

## 2021-03-02 NOTE — H&P (Signed)
History and Physical    Traci Chandler ZOX:[id number] DOBQUANTINA DERSHEMOA: 03/02/2023  PCP: Irena Reichmann, DO (Confirm with patient/family/NH records and if not entered, this has to be entered at [facility] point of entry) Patient coming from: Home  I have personally briefly reviewed patient's old medical records in [facility]  Chief Complaint: belly hurts.  HPI: Traci Chandler is a 65 y.o. female with medical history significant of HTN, CKD stage II, hypertrophic nonobstructive cardiomyopathy, came with new onset of abdominal pain.  Patient has been having frequent " gas and bloating" after eating, she attributed as dyspepsia and has been taking as needed Maalox with some relief.  Last night, few hours after eating dinner, she started to have a cramping-like abdominal pain, on epigastric area, cramping-like, localized, comes and goes, which is new, and the feeling nausea but no vomiting.  Overnight, the cramping like abdominal pain became worse and more constant.  She vomited 1 time with stomach content, no bile no blood no coffee-ground stuff.  Denies any fever chills, no diarrhea.  ED Course: Vital signs stable, no hypotension no tachycardia no fever.  WBC within normal limits, blood work AST 20 ALT 16, CT abdomen showed stones/in gallbladder, dilated CBD and intra hepatorenal bile duct.  MRCP showed no definitive CBD obstructions but dilated CBD.  General surgeon consulted.  Multiple morphine injection given, with minimum help.  Review of Systems: As per HPI otherwise 14 point review of systems negative.    Past Medical History:  Diagnosis Date   Essential hypertension    GERD (gastroesophageal reflux disease)    Hypertrophic non-obstructive cardiomyopathy (HCC)    Echocardiogram 2011    Past Surgical History:  Procedure Laterality Date   ABDOMINAL HYSTERECTOMY     COLONOSCOPY N/A 04/15/2013   Procedure: COLONOSCOPY;  Surgeon: West Bali, MD;  Location: [facility];   Service: Endoscopy;  Laterality: N/A;  9:30     reports that she has never smoked. She has never used smokeless tobacco. She reports that she does not drink alcohol and does not use drugs.  Allergies  Allergen Reactions   Codeine Nausea And Vomiting   Diclofenac    Lisinopril Other (See Comments)    Family History  Problem Relation Age of Onset   Diabetes type II Mother    Hypertension Mother    Diabetes type II Sister    Hypertension Sister    Prostate cancer Father    Colon cancer Neg Hx      Prior to Admission medications   Medication Sig Start Date End Date Taking? Authorizing Provider  aspirin 81 MG tablet Take 81 mg by mouth every evening.     [provider]  atenolol (TENORMIN) 50 MG tablet Take 1 tablet by mouth daily. 02/11/18   [provider]  docusate sodium (COLACE) 100 MG capsule Take 1 capsule (100 mg total) by mouth daily as needed for mild constipation. 12/06/20 03/06/21  Edwin Cap, DPM  meloxicam (MOBIC) 15 MG tablet Take 1 tablet (15 mg total) by mouth daily as needed for pain. 08/16/20   McDonald, Rachelle Hora, DPM  NIFEdipine (PROCARDIA XL/NIFEDICAL XL) 60 MG 24 hr tablet Take 60 mg by mouth daily. 07/09/19   [provider]  omeprazole (PRILOSEC) 20 MG capsule TAKE 1 CAPSULE 2 TIMES A DAY BEFORE A MEAL. 08/10/19   Anice Paganini, NP  spironolactone (ALDACTONE) 25 MG tablet Take 25 mg by mouth 2 (two) times daily.  [provider]  tiZANidine (ZANAFLEX) 2 MG tablet Take 1-2 tablets (2-4 mg total) by mouth every 8 (eight) hours as needed for muscle spasms. 11/28/20   Bing Neighbors, FNP  Turmeric 500 MG CAPS Take by mouth.    [provider]    Physical Exam: Vitals:   03/02/21 1610 03/02/21 1611 03/02/21 1927 03/02/21 1928  BP:   (!) 158/88   Pulse: 84 87 84 86  Resp:      Temp:      TempSrc:      SpO2: 97% 97% 99% 97%  Weight:      Height:        Constitutional: NAD, calm, comfortable Vitals:    03/02/21 1610 03/02/21 1611 03/02/21 1927 03/02/21 1928  BP:   (!) 158/88   Pulse: 84 87 84 86  Resp:      Temp:      TempSrc:      SpO2: 97% 97% 99% 97%  Weight:      Height:       Eyes: PERRL, lids and conjunctivae normal ENMT: Mucous membranes are moist. Posterior pharynx clear of any exudate or lesions.Normal dentition.  Neck: normal, supple, no masses, no thyromegaly Respiratory: clear to auscultation bilaterally, no wheezing, no crackles. Normal respiratory effort. No accessory muscle use.  Cardiovascular: Regular rate and rhythm, no murmurs / rubs / gallops. No extremity edema. 2+ pedal pulses. No carotid bruits.  Abdomen: RUQ tenderness with some guarding, no masses palpated. No hepatosplenomegaly. Bowel sounds positive.  Musculoskeletal: no clubbing / cyanosis. No joint deformity upper and lower extremities. Good ROM, no contractures. Normal muscle tone.  Skin: no rashes, lesions, ulcers. No induration Neurologic: CN 2-12 grossly intact. Sensation intact, DTR normal. Strength 5/5 in all 4.  Psychiatric: Normal judgment and insight. Alert and oriented x 3. Normal mood.     Labs on Admission: I have personally reviewed following labs and imaging studies  CBC: Recent Labs  Lab 03/02/21 1214  WBC 7.2  HGB 15.0  HCT 46.1*  MCV 92.0  PLT 171   Basic Metabolic Panel: Recent Labs  Lab 03/02/21 1214  NA 133*  K 4.5  CL 100  CO2 24  GLUCOSE 151*  BUN 21  CREATININE 1.23*  CALCIUM 9.6   GFR: Estimated Creatinine Clearance: 55.4 mL/min (A) (by C-G formula based on SCr of 1.23 mg/dL (H)). Liver Function Tests: Recent Labs  Lab 03/02/21 1214  AST 20  ALT 16  ALKPHOS 71  BILITOT 0.5  PROT 8.9*  ALBUMIN 4.7   Recent Labs  Lab 03/02/21 1214  LIPASE 28   No results for input(s): AMMONIA in the last 168 hours. Coagulation Profile: No results for input(s): INR, PROTIME in the last 168 hours. Cardiac Enzymes: No results for input(s): CKTOTAL, CKMB, CKMBINDEX,  TROPONINI in the last 168 hours. BNP (last 3 results) No results for input(s): PROBNP in the last 8760 hours. HbA1C: No results for input(s): HGBA1C in the last 72 hours. CBG: No results for input(s): GLUCAP in the last 168 hours. Lipid Profile: No results for input(s): CHOL, HDL, LDLCALC, TRIG, CHOLHDL, LDLDIRECT in the last 72 hours. Thyroid Function Tests: No results for input(s): TSH, T4TOTAL, FREET4, T3FREE, THYROIDAB in the last 72 hours. Anemia Panel: No results for input(s): VITAMINB12, FOLATE, FERRITIN, TIBC, IRON, RETICCTPCT in the last 72 hours. Urine analysis:    Component Value Date/Time   COLORURINE STRAW (A) 03/02/2021 1710   APPEARANCEUR HAZY (A) 03/02/2021 1710  LABSPEC 1.026 03/02/2021 1710   PHURINE 6.0 03/02/2021 1710   GLUCOSEU NEGATIVE 03/02/2021 1710   HGBUR MODERATE (A) 03/02/2021 1710   BILIRUBINUR NEGATIVE 03/02/2021 1710   KETONESUR NEGATIVE 03/02/2021 1710   PROTEINUR 100 (A) 03/02/2021 1710   UROBILINOGEN 0.2 10/13/2012 2021   NITRITE NEGATIVE 03/02/2021 1710   LEUKOCYTESUR LARGE (A) 03/02/2021 1710    Radiological Exams on Admission: CT ABDOMEN PELVIS W CONTRAST  Result Date: 03/02/2021 CLINICAL DATA:  Abdominal pain EXAM: CT ABDOMEN AND PELVIS WITH CONTRAST TECHNIQUE: Multidetector CT imaging of the abdomen and pelvis was performed using the standard protocol following bolus administration of intravenous contrast. CONTRAST:  OMNIPAQUE IOHEXOL 300 MG/ML  SOLN COMPARISON:  None. FINDINGS: Lower chest: No acute abnormality. Hepatobiliary: No focal liver lesions. Distended gallbladder containing high density material which is likely a combination of stones and sludge. Dilated intrahepatic bile ducts and common bile duct Pancreas: Unremarkable. Main pancreatic duct at the level of the pancreatic head, measuring up to 7 mm. No surrounding inflammatory changes. Spleen: Normal in size without focal abnormality. Adrenals/Urinary Tract: Kidneys enhance  symmetrically with no evidence of hydronephrosis or nephrolithiasis. Bilateral low-attenuation renal lesions, largest are compatible with simple cysts, others are too small to completely characterize. Bladder is unremarkable. 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 Stomach/Bowel: Small hiatal hernia. Normal appendix. Small duodenal diverticulum. 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 No evidence of bowel wall thickening, obstruction, or inflammatory change. Vascular/Lymphatic: Aortic atherosclerosis. No enlarged abdominal or pelvic lymph nodes. Reproductive: Status post hysterectomy. No adnexal masses. Other: No abdominal wall hernia or abnormality. No abdominopelvic ascites. Musculoskeletal: No acute or significant osseous findings. IMPRESSION: Dilated intrahepatic and extrahepatic bile ducts, and mild dilation of the pancreatic duct at the pancreatic head. Recommend further evaluation with MRCP with and without contrast to exclude underlying mass or stone. Distended gallbladder containing high density material which is likely a combination of stones and sludge. This could be further evaluated with gallbladder ultrasound or MRI is as recommended above if there is clinical concern for acute cholecystitis. 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 Aortic Atherosclerosis (ICD10-I70.0). Electronically Signed   By: Allegra Lai M.D.   On: 03/02/2021 15:18   MR 3D Recon At Scanner  Result Date: 03/02/2021 CLINICAL DATA:  Right upper quadrant pain. Biliary ductal dilatation on recent CT. EXAM: MRI ABDOMEN WITHOUT AND WITH CONTRAST (INCLUDING MRCP) TECHNIQUE: Multiplanar multisequence MR imaging of the abdomen was performed both before and after the administration of intravenous contrast. Heavily T2-weighted images of the biliary and pancreatic ducts were  obtained, and three-dimensional MRCP images were rendered by post processing. CONTRAST:  103mL GADAVIST GADOBUTROL 1 MMOL/ML IV SOLN COMPARISON:  CT on 03/02/2021 FINDINGS: Lower chest: No acute findings. Hepatobiliary: Image degradation by motion artifact noted. No hepatic masses identified. Numerous tiny less than 1 cm gallstones are seen filling the gallbladder. No evidence of gallbladder wall thickening or pericholecystic inflammatory changes. Mild diffuse biliary ductal dilatation is seen with common bile duct measuring 11 mm in diameter. However, there is no evidence of choledocholithiasis or biliary stricture. Pancreas: No evidence of pancreatic mass or inflammatory changes. Although limited by motion artifact, there is no evidence of pancreatic ductal dilatation. Spleen:  Within normal limits in size and appearance. Adrenals/Urinary Tract: Several small benign-appearing renal cysts are seen bilaterally. No  masses identified. No evidence of hydronephrosis. Stomach/Bowel: Tiny hiatal hernia noted.  Otherwise unremarkable. Vascular/Lymphatic: No pathologically enlarged lymph nodes identified. No acute vascular findings. Other:  None. Musculoskeletal:  No suspicious bone lesions identified. IMPRESSION: Image degradation by motion artifact noted. Cholelithiasis. No radiographic evidence of acute cholecystitis. Mild diffuse biliary ductal dilatation. No evidence of choledocholithiasis, pancreatic mass, or other obstructing etiology apparent. Tiny hiatal hernia. Electronically Signed   By: Danae Orleans M.D.   On: 03/02/2021 17:44   MR ABDOMEN MRCP W WO CONTAST  Result Date: 03/02/2021 CLINICAL DATA:  Right upper quadrant pain. Biliary ductal dilatation on recent CT. EXAM: MRI ABDOMEN WITHOUT AND WITH CONTRAST (INCLUDING MRCP) TECHNIQUE: Multiplanar multisequence MR imaging of the abdomen was performed both before and after the administration of intravenous contrast. Heavily T2-weighted images of the biliary and  pancreatic ducts were obtained, and three-dimensional MRCP images were rendered by post processing. CONTRAST:  36mL GADAVIST GADOBUTROL 1 MMOL/ML IV SOLN COMPARISON:  CT on 03/02/2021 FINDINGS: Lower chest: No acute findings. Hepatobiliary: Image degradation by motion artifact noted. No hepatic masses identified. Numerous tiny less than 1 cm gallstones are seen filling the gallbladder. No evidence of gallbladder wall thickening or pericholecystic inflammatory changes. Mild diffuse biliary ductal dilatation is seen with common bile duct measuring 11 mm in diameter. However, there is no evidence of choledocholithiasis or biliary stricture. Pancreas: No evidence of pancreatic mass or inflammatory changes. Although limited by motion artifact, there is no evidence of pancreatic ductal dilatation. Spleen:  Within normal limits in size and appearance. Adrenals/Urinary Tract: Several small benign-appearing renal cysts are seen bilaterally. No masses identified. No evidence of hydronephrosis. Stomach/Bowel: Tiny hiatal hernia noted.  Otherwise unremarkable. Vascular/Lymphatic: No pathologically enlarged lymph nodes identified. No acute vascular findings. Other:  None. Musculoskeletal:  No suspicious bone lesions identified. IMPRESSION: Image degradation by motion artifact noted. Cholelithiasis. No radiographic evidence of acute cholecystitis. Mild diffuse biliary ductal dilatation. No evidence of choledocholithiasis, pancreatic mass, or other obstructing etiology apparent. Tiny hiatal hernia. Electronically Signed   By: Danae Orleans M.D.   On: 03/02/2021 17:44    EKG: Independently reviewed.  Sinus, chronic T wave inversion on lead I and aVL  Assessment/Plan Active Problems:   Colic, biliary  (please populate well all problems here in Problem List. (For example, if patient is on BP meds at home and you resume or decide to hold them, it is a problem that needs to be her. Same for CAD, COPD, HLD and so on)  Biliary  colic -Symptomatic gallstones, no signs of acute cholecystitis on image study, no signs of acute ascending cholangitis with normal LFTs as of now.  No fever or elevated BC count, will hold off antibiotics for now.  Surgeon reviewed the case and recommend patient admitted.  Consider HIDA scan tomorrow if symptoms persisted.  Repeat CBC and LFT in the morning. -No significant CBD obstruction, might be passing stone, will consider GI consult for ERCP only if patient develop symptoms and signs of cholangitis or elevated LFTs tomorrow. -Pain meds -IVF -NPO after midnight.  History of asymmetrical nonobstructive hypertrophic cardiomyopathy -No chest pains, troponins negative x2, EKG showed chronic T wave changes on lead I and aVL.  As of now, there is no acute issue.  HTN -Continue home BP meds, add as needed hydralazine IV in case unable to tolerate p.o.  CKD stage II -Euvolemic, creatinine level stable, IVF to prevent dehydration.  DVT prophylaxis: Lovenox Code Status: Full code Family Communication: None at  bedside Disposition Plan: Expect more than 2 midnight hospital stay, expect cholecystectomy. Consults called: Dr. Henreitta Leber Admission status: MedSurg admission   Emeline General MD [facility] Pager [phone]  03/02/2021, 7:29 PM

## 2021-03-02 NOTE — ED Notes (Signed)
Patient is being discharged from the Urgent Care and sent to the Emergency Department via pov . Per [place], [place], patient is in need of higher level of care due to ABD pain. Patient is aware and verbalizes understanding of plan of care.  Vitals:   03/02/21 1042  BP: (!) 165/83  Pulse: 87  Resp: 19  Temp: 97.8 F (36.6 C)  SpO2: 97%

## 2021-03-02 NOTE — ED Provider Notes (Signed)
[facility] EMERGENCY DEPARTMENT Provider Note   CSN: [id number] Arrival date & time: 03/02/21  1100     History Chief Complaint  Patient presents with   Abdominal Pain    Traci Chandler is a 65 y.o. female.  Pt presents to the ED today with epigastric abd pain and n/v.  Pt said it started in the night.  She has vomited twice today.  She is unable to keep anything down.  She denies any f/c.  No diarrhea.  She has never had anything like this in the past.        Past Medical History:  Diagnosis Date   Essential hypertension    GERD (gastroesophageal reflux disease)    Hypertrophic non-obstructive cardiomyopathy (HCC)    Echocardiogram 2011    Patient Active Problem List   Diagnosis Date Noted   GERD (gastroesophageal reflux disease) 01/14/2018   Abdominal pain 01/14/2018   Weakness 01/14/2018   Encounter for screening colonoscopy 04/02/2013   Hypertrophic cardiomyopathy (HCC) 09/12/2011   Essential hypertension, benign 09/12/2011    Past Surgical History:  Procedure Laterality Date   ABDOMINAL HYSTERECTOMY     COLONOSCOPY N/A 04/15/2013   Procedure: COLONOSCOPY;  Surgeon: West Bali, MD;  Location: [facility];  Service: Endoscopy;  Laterality: N/A;  9:30     OB History   No obstetric history on file.     Family History  Problem Relation Age of Onset   Diabetes type II Mother    Hypertension Mother    Diabetes type II Sister    Hypertension Sister    Prostate cancer Father    Colon cancer Neg Hx     Social History   Tobacco Use   Smoking status: Never   Smokeless tobacco: Never  Vaping Use   Vaping Use: Never used  Substance Use Topics   Alcohol use: No   Drug use: No    Home Medications Prior to Admission medications   Medication Sig Start Date End Date Taking? Authorizing Provider  aspirin 81 MG tablet Take 81 mg by mouth every evening.     [provider]  atenolol (TENORMIN) 50 MG tablet Take 1 tablet by mouth daily.  02/11/18   [provider]  docusate sodium (COLACE) 100 MG capsule Take 1 capsule (100 mg total) by mouth daily as needed for mild constipation. 12/06/20 03/06/21  Edwin Cap, DPM  meloxicam (MOBIC) 15 MG tablet Take 1 tablet (15 mg total) by mouth daily as needed for pain. 08/16/20   McDonald, Rachelle Hora, DPM  NIFEdipine (PROCARDIA XL/NIFEDICAL XL) 60 MG 24 hr tablet Take 60 mg by mouth daily. 07/09/19   [provider]  omeprazole (PRILOSEC) 20 MG capsule TAKE 1 CAPSULE 2 TIMES A DAY BEFORE A MEAL. 08/10/19   Anice Paganini, NP  spironolactone (ALDACTONE) 25 MG tablet Take 25 mg by mouth 2 (two) times daily.    [provider]  tiZANidine (ZANAFLEX) 2 MG tablet Take 1-2 tablets (2-4 mg total) by mouth every 8 (eight) hours as needed for muscle spasms. 11/28/20   Bing Neighbors, FNP  Turmeric 500 MG CAPS Take by mouth.    [provider]    Allergies    Codeine, Diclofenac, and Lisinopril  Review of Systems   Review of Systems  Gastrointestinal:  Positive for abdominal pain, nausea and vomiting.  All other systems reviewed and are negative.  Physical Exam Updated Vital Signs BP (!) 142/70   Pulse  76   Temp 97.6 F (36.4 C) (Oral)   Resp 18   Ht 5\' 6"  (1.676 m)   Wt 108.9 kg   SpO2 100%   BMI 38.74 kg/m   Physical Exam Vitals and nursing note reviewed.  Constitutional:      Appearance: She is well-developed. She is obese.  HENT:     Head: Normocephalic and atraumatic.     Mouth/Throat:     Mouth: Mucous membranes are moist.     Pharynx: Oropharynx is clear.  Eyes:     Extraocular Movements: Extraocular movements intact.     Pupils: Pupils are equal, round, and reactive to light.  Cardiovascular:     Rate and Rhythm: Normal rate and regular rhythm.     Heart sounds: Normal heart sounds.  Pulmonary:     Effort: Pulmonary effort is normal.     Breath sounds: Normal breath sounds.  Abdominal:     General: Abdomen is flat. Bowel sounds are  normal.     Palpations: Abdomen is soft.     Tenderness: There is abdominal tenderness in the epigastric area.  Skin:    General: Skin is warm and dry.     Capillary Refill: Capillary refill takes less than 2 seconds.  Neurological:     General: No focal deficit present.     Mental Status: She is alert and oriented to person, place, and time.  Psychiatric:        Mood and Affect: Mood normal.        Behavior: Behavior normal.    ED Results / Procedures / Treatments   Labs (all labs ordered are listed, but only abnormal results are displayed) Labs Reviewed  COMPREHENSIVE METABOLIC PANEL - Abnormal; Notable for the following components:      Result Value   Sodium 133 (*)    Glucose, Bld 151 (*)    Creatinine, Ser 1.23 (*)    Total Protein 8.9 (*)    GFR, Estimated 48 (*)    All other components within normal limits  CBC - Abnormal; Notable for the following components:   HCT 46.1 (*)    All other components within normal limits  LIPASE, BLOOD  URINALYSIS, ROUTINE W REFLEX MICROSCOPIC  TROPONIN I (HIGH SENSITIVITY)  TROPONIN I (HIGH SENSITIVITY)    EKG EKG Interpretation  Date/Time:  Thursday March 02 2021 11:48:17 EDT Ventricular Rate:  78 PR Interval:  254 QRS Duration: 114 QT Interval:  410 QTC Calculation: 467 R Axis:   -6 Text Interpretation: Sinus rhythm with 1st degree A-V block Minimal voltage criteria for LVH, may be normal variant ( Cornell product ) Cannot rule out Anterior infarct , age undetermined T wave abnormality, consider lateral ischemia Abnormal ECG No significant change since last tracing Confirmed by [id number] [phone]) on 03/02/2021 11:52:21 AM  Radiology CT ABDOMEN PELVIS W CONTRAST  Result Date: 03/02/2021 CLINICAL DATA:  Abdominal pain EXAM: CT ABDOMEN AND PELVIS WITH CONTRAST TECHNIQUE: Multidetector CT imaging of the abdomen and pelvis was performed using the standard protocol following bolus administration of intravenous contrast.  CONTRAST:  03/04/2021 OMNIPAQUE IOHEXOL 300 MG/ML  SOLN COMPARISON:  None. FINDINGS: Lower chest: No acute abnormality. Hepatobiliary: No focal liver lesions. Distended gallbladder containing high density material which is likely a combination of stones and sludge. Dilated intrahepatic bile ducts and common bile duct Pancreas: Unremarkable. Main pancreatic duct at the level of the pancreatic head, measuring up to 7 mm. No surrounding inflammatory changes. Spleen: Normal in  size without focal abnormality. Adrenals/Urinary Tract: Kidneys enhance symmetrically with no evidence of hydronephrosis or nephrolithiasis. Bilateral low-attenuation renal lesions, largest are compatible with simple cysts, others are too small to completely characterize. Bladder is unremarkable. 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 Stomach/Bowel: Small hiatal hernia. Normal appendix. Small duodenal diverticulum. 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 No evidence of bowel wall thickening, obstruction, or inflammatory change. Vascular/Lymphatic: Aortic atherosclerosis. No enlarged abdominal or pelvic lymph nodes. Reproductive: Status post hysterectomy. No adnexal masses. Other: No abdominal wall hernia or abnormality. No abdominopelvic ascites. Musculoskeletal: No acute or significant osseous findings. IMPRESSION: Dilated intrahepatic and extrahepatic bile ducts, and mild dilation of the pancreatic duct at the pancreatic head. Recommend further evaluation with MRCP with and without contrast to exclude underlying mass or stone. Distended gallbladder containing high density material which is likely a combination of stones and sludge. This could be further evaluated with gallbladder ultrasound or MRI is as recommended above if there is clinical concern for acute cholecystitis. 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 Aortic Atherosclerosis (ICD10-I70.0). Electronically Signed   By: Allegra Lai M.D.   On: 03/02/2021 15:18    Procedures Procedures   Medications Ordered in ED Medications  iohexol (OMNIPAQUE) 350 MG/ML injection 100 mL (has no administration in time range)  morphine 4 MG/ML injection 4 mg (4 mg Intravenous Not Given 03/02/21 1453)  morphine 4 MG/ML injection 4 mg (4 mg Intravenous Given 03/02/21 1337)  ondansetron (ZOFRAN) injection 4 mg (4 mg Intravenous Given 03/02/21 1336)  sodium chloride 0.9 % bolus 1,000 mL (0 mLs Intravenous Stopped 03/02/21 1538)  iohexol (OMNIPAQUE) 300 MG/ML solution 100 mL (100 mLs Intravenous Contrast Given 03/02/21 1419)  promethazine (PHENERGAN) 12.5 mg in sodium chloride 0.9 % 50 mL IVPB (12.5 mg Intravenous New Bag/Given 03/02/21 1543)    ED Course  I have reviewed the triage vital signs and the nursing notes.  Pertinent labs & imaging results that were available during my care of the patient were reviewed by me and considered in my medical decision making (see chart for details).    MDM Rules/Calculators/A&P                           Pt is still having pain and nausea.  CT abn.  MRCP ordered.  Pt signed out to Dr. Deretha Emory.  Dr. Henreitta Leber consulted.  Final Clinical Impression(s) / ED Diagnoses Final diagnoses:  Epigastric pain  Nausea and vomiting, intractability of vomiting not specified, unspecified vomiting type    Rx / DC Orders ED Discharge Orders     None        Jacalyn Lefevre, MD 03/02/21 1612

## 2021-03-02 NOTE — ED Provider Notes (Signed)
Discussed with Dr. Henreitta Leber.  The CT scan raise some concerns perhaps for biliary or pancreatic obstruction.  MR ERCP ordered.  Without evidence of any obstruction.  The dilatation.  Does show cholelithiasis.  The original plan was that she was going to be admitted even though her labs were normal because she was tender in right upper quadrant had persistent pain.  Patient still having persistent pain and tender right upper quadrant.  Will contact hospitalist for admission Dr. Henreitta Leber will consult.   Vanetta Mulders, MD 03/02/21 [phone]

## 2021-03-02 NOTE — ED Triage Notes (Signed)
Epigastric pain that started last night

## 2021-03-02 NOTE — ED Triage Notes (Signed)
Pt c/o epigastric pain that started last night with radiation between shoulder blades. Pt initially has some nausea and has vomited x 2 today. Denies ShOB, fever, or diarrhea.

## 2021-03-03 DIAGNOSIS — K8020 Calculus of gallbladder without cholecystitis without obstruction: Secondary | ICD-10-CM | POA: Diagnosis not present

## 2021-03-03 LAB — CBC WITH DIFFERENTIAL/PLATELET
Abs Immature Granulocytes: 0.02 10*3/uL (ref 0.00–0.07)
Basophils Absolute: 0 10*3/uL (ref 0.0–0.1)
Basophils Relative: 0 %
Eosinophils Absolute: 0 10*3/uL (ref 0.0–0.5)
Eosinophils Relative: 0 %
HCT: 44.2 % (ref 36.0–46.0)
Hemoglobin: 14.8 g/dL (ref 12.0–15.0)
Immature Granulocytes: 0 %
Lymphocytes Relative: 15 %
Lymphs Abs: 1.2 10*3/uL (ref 0.7–4.0)
MCH: 30.8 pg (ref 26.0–34.0)
MCHC: 33.5 g/dL (ref 30.0–36.0)
MCV: 91.9 fL (ref 80.0–100.0)
Monocytes Absolute: 0.8 10*3/uL (ref 0.1–1.0)
Monocytes Relative: 10 %
Neutro Abs: 6 10*3/uL (ref 1.7–7.7)
Neutrophils Relative %: 75 %
Platelets: 164 10*3/uL (ref 150–400)
RBC: 4.81 MIL/uL (ref 3.87–5.11)
RDW: 12.9 % (ref 11.5–15.5)
WBC: 8.1 10*3/uL (ref 4.0–10.5)
nRBC: 0 % (ref 0.0–0.2)

## 2021-03-03 LAB — COMPREHENSIVE METABOLIC PANEL
ALT: 17 U/L (ref 0–44)
AST: 22 U/L (ref 15–41)
Albumin: 4 g/dL (ref 3.5–5.0)
Alkaline Phosphatase: 64 U/L (ref 38–126)
Anion gap: 8 (ref 5–15)
BUN: 15 mg/dL (ref 8–23)
CO2: 27 mmol/L (ref 22–32)
Calcium: 9.1 mg/dL (ref 8.9–10.3)
Chloride: 97 mmol/L — ABNORMAL LOW (ref 98–111)
Creatinine, Ser: 1.06 mg/dL — ABNORMAL HIGH (ref 0.44–1.00)
GFR, Estimated: 58 mL/min — ABNORMAL LOW (ref >60)
Glucose, Bld: 109 mg/dL — ABNORMAL HIGH (ref 70–99)
Potassium: 4.5 mmol/L (ref 3.5–5.1)
Sodium: 132 mmol/L — ABNORMAL LOW (ref 135–145)
Total Bilirubin: 0.9 mg/dL (ref 0.3–1.2)
Total Protein: 7.9 g/dL (ref 6.5–8.1)

## 2021-03-03 LAB — HIV ANTIBODY (ROUTINE TESTING W REFLEX): HIV Screen 4th Generation wRfx: NONREACTIVE

## 2021-03-03 LAB — HEMOGLOBIN A1C
Hgb A1c MFr Bld: 5.5 % (ref 4.8–5.6)
Mean Plasma Glucose: 111.15 mg/dL

## 2021-03-03 NOTE — ED Notes (Signed)
Tolerating po food/fluids without n/v or increased pain

## 2021-03-03 NOTE — ED Notes (Signed)
Patient given meal tray per order.

## 2021-03-03 NOTE — ED Notes (Signed)
Attending called into patient room to discuss possible discharge with outpatient follow up

## 2021-03-03 NOTE — ED Notes (Signed)
Awaiting meal tray delivery

## 2021-03-03 NOTE — Consult Note (Signed)
[facility] Consult  Reason for Consult: Biliary colic  Referring Physician:  ED   Chief Complaint   Abdominal Pain     HPI: Traci Chandler is a 65 y.o. female with RUQ pain and associated nausea and vomiting that came on suddenly and has never had this prior. LFTs were all number but CT demonstrated concern for dilated CBD and pancreatic duct. MRCP was done to ensure no stones or narrowing. The MRCP demonstrated no masses or stricture and a normal pancreatic duct and dilated CBD without stones. She is feeling much better and having no pain or nausea/ vomiting. 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   She has a history of hypertrophic cardiomyopathy but no chest pain or SOB. No leg swelling. Her ECHO have been stable. She follows with cardiology.   Past Medical History:  Diagnosis Date   Essential hypertension    GERD (gastroesophageal reflux disease)    Hypertrophic non-obstructive cardiomyopathy (HCC)    Echocardiogram 2011    Past Surgical History:  Procedure Laterality Date   ABDOMINAL HYSTERECTOMY     COLONOSCOPY N/A 04/15/2013   Procedure: COLONOSCOPY;  Surgeon: West Bali, MD;  Location: [facility];  Service: Endoscopy;  Laterality: N/A;  9:30    Family History  Problem Relation Age of Onset   Diabetes type II Mother    Hypertension Mother    Diabetes type II Sister    Hypertension Sister    Prostate cancer Father    Colon cancer Neg Hx     Social History   Tobacco Use   Smoking status: Never   Smokeless tobacco: Never  Vaping Use   Vaping Use: Never used  Substance Use Topics   Alcohol use: No   Drug use: No    Medications: I have reviewed the patient's current medications. Prior to Admission: (Not in a hospital admission) Scheduled:  aspirin EC  81 mg Oral QPM   atenolol  50 mg Oral Daily   enoxaparin (LOVENOX) injection  40 mg Subcutaneous Daily   NIFEdipine  60 mg Oral  Daily   pantoprazole  40 mg Oral Daily   spironolactone  25 mg Oral BID   Continuous: AST:[id number], HYDROmorphone (DILAUDID) injection, iohexol, ondansetron **OR** ondansetron (ZOFRAN) IV, tiZANidine  Allergies  Allergen Reactions   Codeine Nausea And Vomiting   Diclofenac    Lisinopril Other (See Comments)     ROS:  A comprehensive review of systems was negative except for: Gastrointestinal: positive for abdominal pain, nausea, and vomiting  Blood pressure 128/74, pulse 70, temperature 98 F (36.7 C), temperature source Oral, resp. rate 18, height 5\' 6"  (1.676 m), weight 108.9 kg, SpO2 98 %. Physical Exam Vitals reviewed.  Constitutional:      Appearance: She is well-developed.  HENT:     Head: Normocephalic.  Eyes:     Extraocular Movements: Extraocular movements intact.  Cardiovascular:     Rate and Rhythm: Normal rate and regular rhythm.  Pulmonary:     Effort: Pulmonary effort is normal.     Breath sounds: Normal breath sounds.  Abdominal:     General: There is no distension.     Palpations: Abdomen is soft.     Tenderness: There is no abdominal tenderness.  Musculoskeletal:     Comments: Moves all extremities   Skin:    General: Skin is warm.  Neurological:     General: No focal deficit present.  Mental Status: She is alert and oriented to person, place, and time.  Psychiatric:        Behavior: Behavior normal.    Results: Results for orders placed or performed during the hospital encounter of 03/02/21 (from the past 48 hour(s))  Lipase, blood     Status: None   Collection Time: 03/02/21 12:14 PM  Result Value Ref Range   Lipase 28 11 - 51 U/L    Comment: Performed at [facility], [address] [place], [place] [address]  Comprehensive metabolic panel     Status: Abnormal   Collection Time: 03/02/21 12:14 PM  Result Value Ref Range   Sodium 133 (L) 135 - 145 mmol/L   Potassium 4.5 3.5 - 5.1 mmol/L   Chloride 100 98 - 111 mmol/L   CO2 24 22 -  32 mmol/L   Glucose, Bld 151 (H) 70 - 99 mg/dL    Comment: Glucose reference range applies only to samples taken after fasting for at least 8 hours.   BUN 21 8 - 23 mg/dL   Creatinine, Ser 7.93 (H) 0.44 - 1.00 mg/dL   Calcium 9.6 8.9 - 90.3 mg/dL   Total Protein 8.9 (H) 6.5 - 8.1 g/dL   Albumin 4.7 3.5 - 5.0 g/dL   AST 20 15 - 41 U/L   ALT 16 0 - 44 U/L   Alkaline Phosphatase 71 38 - 126 U/L   Total Bilirubin 0.5 0.3 - 1.2 mg/dL   GFR, Estimated 48 (L) >60 mL/min    Comment: (NOTE) Calculated using the CKD-EPI Creatinine Equation (2021)    Anion gap 9 5 - 15    Comment: Performed at [facility], [address] [place], [place] [address]  CBC     Status: Abnormal   Collection Time: 03/02/21 12:14 PM  Result Value Ref Range   WBC 7.2 4.0 - 10.5 K/uL   RBC 5.01 3.87 - 5.11 MIL/uL   Hemoglobin 15.0 12.0 - 15.0 g/dL   HCT 30.0 (H) 76.2 - 26.3 %   MCV 92.0 80.0 - 100.0 fL   MCH 29.9 26.0 - 34.0 pg   MCHC 32.5 30.0 - 36.0 g/dL   RDW 33.5 45.6 - 25.6 %   Platelets 171 150 - 400 K/uL   nRBC 0.0 0.0 - 0.2 %    Comment: Performed at [facility], [address], [place], [place] [address]  Troponin I (High Sensitivity)     Status: None   Collection Time: 03/02/21 12:15 PM  Result Value Ref Range   Troponin I (High Sensitivity) 6 <18 ng/L    Comment: (NOTE) Elevated high sensitivity troponin I (hsTnI) values and significant  changes across serial measurements may suggest ACS but many other  chronic and acute conditions are known to elevate hsTnI results.  Refer to the "Links" section for chest pain algorithms and additional  guidance. Performed at [facility], [address] [place], [place] [address]   Troponin I (High Sensitivity)     Status: None   Collection Time: 03/02/21  1:58 PM  Result Value Ref Range   Troponin I (High Sensitivity) 7 <18 ng/L    Comment: (NOTE) Elevated high sensitivity troponin I (hsTnI) values and significant  changes across serial measurements may  suggest ACS but many other  chronic and acute conditions are known to elevate hsTnI results.  Refer to the "Links" section for chest pain algorithms and additional  guidance. Performed at [facility], [address] [place], [place]  [address]   Urinalysis, Routine w reflex microscopic Urine, Clean Catch     Status: Abnormal   Collection Time: 03/02/21  5:10 PM  Result Value Ref Range   Color, Urine STRAW (A) YELLOW   APPearance HAZY (A) CLEAR   Specific Gravity, Urine 1.026 1.005 - 1.030   pH 6.0 5.0 - 8.0   Glucose, UA NEGATIVE NEGATIVE mg/dL   Hgb urine dipstick MODERATE (A) NEGATIVE   Bilirubin Urine NEGATIVE NEGATIVE   Ketones, ur NEGATIVE NEGATIVE mg/dL   Protein, ur 735 (A) NEGATIVE mg/dL   Nitrite NEGATIVE NEGATIVE   Leukocytes,Ua LARGE (A) NEGATIVE   RBC / HPF 0-5 0 - 5 RBC/hpf   WBC, UA >50 (H) 0 - 5 WBC/hpf   Bacteria, UA RARE (A) NONE SEEN   Squamous Epithelial / LPF 0-5 0 - 5   Trichomonas, UA PRESENT (A) NONE SEEN    Comment: Performed at [facility], [address] [place], [place] [address]  Resp Panel by RT-PCR (Flu A&B, Covid) Nasopharyngeal Swab     Status: None   Collection Time: 03/02/21  8:40 PM   Specimen: Nasopharyngeal Swab; Nasopharyngeal(NP) swabs in vial transport medium  Result Value Ref Range   SARS Coronavirus 2 by RT PCR NEGATIVE NEGATIVE    Comment: (NOTE) SARS-CoV-2 target nucleic acids are NOT DETECTED.  The SARS-CoV-2 RNA is generally detectable in upper respiratory specimens during the acute phase of infection. The lowest concentration of SARS-CoV-2 viral copies this assay can detect is 138 copies/mL. A negative result does not preclude SARS-Cov-2 infection and should not be used as the sole basis for treatment or other patient management decisions. A negative result may occur with  improper specimen collection/handling, submission of specimen other than nasopharyngeal swab, presence of viral mutation(s) within the areas targeted by this  assay, and inadequate number of viral copies(<138 copies/mL). A negative result must be combined with clinical observations, patient history, and epidemiological information. The expected result is Negative.  Fact Sheet for Patients:  BloggerCourse.com  Fact Sheet for Healthcare Providers:  SeriousBroker.it  This test is no t yet approved or cleared by the [place] FDA and  has been authorized for detection and/or diagnosis of SARS-CoV-2 by FDA under an Emergency Use Authorization (EUA). This EUA will remain  in effect (meaning this test can be used) for the duration of the COVID-19 declaration under Section 564(b)(1) of the Act, 21 U.S.C.section 360bbb-3(b)(1), unless the authorization is terminated  or revoked sooner.       Influenza A by PCR NEGATIVE NEGATIVE   Influenza B by PCR NEGATIVE NEGATIVE    Comment: (NOTE) The Xpert Xpress SARS-CoV-2/FLU/RSV plus assay is intended as an aid in the diagnosis of influenza from Nasopharyngeal swab specimens and should not be used as a sole basis for treatment. Nasal washings and aspirates are unacceptable for Xpert Xpress SARS-CoV-2/FLU/RSV testing.  Fact Sheet for Patients: BloggerCourse.com  Fact Sheet for Healthcare Providers: SeriousBroker.it  This test is not yet approved or cleared by the [place] FDA and has been authorized for detection and/or diagnosis of SARS-CoV-2 by FDA under an Emergency Use Authorization (EUA). This EUA will remain in effect (meaning this test can be used) for the duration of the COVID-19 declaration under Section 564(b)(1) of the Act, 21 U.S.C. section 360bbb-3(b)(1), unless the authorization is terminated or revoked.  Performed at [facility], [address] [place], [place] [address]   CBC with Differential/Platelet     Status: None   Collection  Time: 03/03/21  4:33 AM  Result Value  Ref Range   WBC 8.1 4.0 - 10.5 K/uL   RBC 4.81 3.87 - 5.11 MIL/uL   Hemoglobin 14.8 12.0 - 15.0 g/dL   HCT 32.4 40.1 - 02.7 %   MCV 91.9 80.0 - 100.0 fL   MCH 30.8 26.0 - 34.0 pg   MCHC 33.5 30.0 - 36.0 g/dL   RDW 25.3 66.4 - 40.3 %   Platelets 164 150 - 400 K/uL   nRBC 0.0 0.0 - 0.2 %   Neutrophils Relative % 75 %   Neutro Abs 6.0 1.7 - 7.7 K/uL   Lymphocytes Relative 15 %   Lymphs Abs 1.2 0.7 - 4.0 K/uL   Monocytes Relative 10 %   Monocytes Absolute 0.8 0.1 - 1.0 K/uL   Eosinophils Relative 0 %   Eosinophils Absolute 0.0 0.0 - 0.5 K/uL   Basophils Relative 0 %   Basophils Absolute 0.0 0.0 - 0.1 K/uL   Immature Granulocytes 0 %   Abs Immature Granulocytes 0.02 0.00 - 0.07 K/uL    Comment: Performed at [facility], [address] [place], [place] [address]  Comprehensive metabolic panel     Status: Abnormal   Collection Time: 03/03/21  4:33 AM  Result Value Ref Range   Sodium 132 (L) 135 - 145 mmol/L   Potassium 4.5 3.5 - 5.1 mmol/L   Chloride 97 (L) 98 - 111 mmol/L   CO2 27 22 - 32 mmol/L   Glucose, Bld 109 (H) 70 - 99 mg/dL    Comment: Glucose reference range applies only to samples taken after fasting for at least 8 hours.   BUN 15 8 - 23 mg/dL   Creatinine, Ser 9.56 (H) 0.44 - 1.00 mg/dL   Calcium 9.1 8.9 - 38.7 mg/dL   Total Protein 7.9 6.5 - 8.1 g/dL   Albumin 4.0 3.5 - 5.0 g/dL   AST 22 15 - 41 U/L   ALT 17 0 - 44 U/L   Alkaline Phosphatase 64 38 - 126 U/L   Total Bilirubin 0.9 0.3 - 1.2 mg/dL   GFR, Estimated 58 (L) >60 mL/min    Comment: (NOTE) Calculated using the CKD-EPI Creatinine Equation (2021)    Anion gap 8 5 - 15    Comment: Performed at [facility], [address], [place], [place] [address]  Hemoglobin A1c     Status: None   Collection Time: 03/03/21  4:33 AM  Result Value Ref Range   Hgb A1c MFr Bld 5.5 4.8 - 5.6 %    Comment: (NOTE) Pre diabetes:          5.7%-6.4%  Diabetes:              >6.4%  Glycemic control for   <7.0% adults with  diabetes    Mean Plasma Glucose 111.15 mg/dL    Comment: Performed at [facility], [address] [place], [place] [address]  HIV Antibody (routine testing w rflx)     Status: None   Collection Time: 03/03/21  4:33 AM  Result Value Ref Range   HIV Screen 4th Generation wRfx Non Reactive Non Reactive    Comment: Performed at [facility], [address], [place], [place] [address]    CT ABDOMEN PELVIS W CONTRAST  Result Date: 03/02/2021 CLINICAL DATA:  Abdominal pain EXAM: CT ABDOMEN AND PELVIS WITH CONTRAST TECHNIQUE: Multidetector CT imaging of the abdomen and pelvis was performed using the standard protocol  following bolus administration of intravenous contrast. CONTRAST:  OMNIPAQUE IOHEXOL 300 MG/ML  SOLN COMPARISON:  None. FINDINGS: Lower chest: No acute abnormality. Hepatobiliary: No focal liver lesions. Distended gallbladder containing high density material which is likely a combination of stones and sludge. Dilated intrahepatic bile ducts and common bile duct Pancreas: Unremarkable. Main pancreatic duct at the level of the pancreatic head, measuring up to 7 mm. No surrounding inflammatory changes. Spleen: Normal in size without focal abnormality. Adrenals/Urinary Tract: Kidneys enhance symmetrically with no evidence of hydronephrosis or nephrolithiasis. Bilateral low-attenuation renal lesions, largest are compatible with simple cysts, others are too small to completely characterize. Bladder is unremarkable. 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 Stomach/Bowel: Small hiatal hernia. Normal appendix. Small duodenal diverticulum. 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 No evidence of bowel wall thickening, obstruction, or inflammatory change. Vascular/Lymphatic: Aortic atherosclerosis. No enlarged abdominal or pelvic lymph nodes. Reproductive: Status post hysterectomy. No adnexal masses. Other: No  abdominal wall hernia or abnormality. No abdominopelvic ascites. Musculoskeletal: No acute or significant osseous findings. IMPRESSION: Dilated intrahepatic and extrahepatic bile ducts, and mild dilation of the pancreatic duct at the pancreatic head. Recommend further evaluation with MRCP with and without contrast to exclude underlying mass or stone. Distended gallbladder containing high density material which is likely a combination of stones and sludge. This could be further evaluated with gallbladder ultrasound or MRI is as recommended above if there is clinical concern for acute cholecystitis. 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 Aortic Atherosclerosis (ICD10-I70.0). Electronically Signed   By: Allegra Lai M.D.   On: 03/02/2021 15:18   MR 3D Recon At Scanner  Result Date: 03/02/2021 CLINICAL DATA:  Right upper quadrant pain. Biliary ductal dilatation on recent CT. EXAM: MRI ABDOMEN WITHOUT AND WITH CONTRAST (INCLUDING MRCP) TECHNIQUE: Multiplanar multisequence MR imaging of the abdomen was performed both before and after the administration of intravenous contrast. Heavily T2-weighted images of the biliary and pancreatic ducts were obtained, and three-dimensional MRCP images were rendered by post processing. CONTRAST:  22mL GADAVIST GADOBUTROL 1 MMOL/ML IV SOLN COMPARISON:  CT on 03/02/2021 FINDINGS: Lower chest: No acute findings. Hepatobiliary: Image degradation by motion artifact noted. No hepatic masses identified. Numerous tiny less than 1 cm gallstones are seen filling the gallbladder. No evidence of gallbladder wall thickening or pericholecystic inflammatory changes. Mild diffuse biliary ductal dilatation is seen with common bile duct measuring 11 mm in diameter. However, there is no evidence of choledocholithiasis or biliary stricture. Pancreas: No evidence of pancreatic mass or inflammatory changes. Although limited by  motion artifact, there is no evidence of pancreatic ductal dilatation. Spleen:  Within normal limits in size and appearance. Adrenals/Urinary Tract: Several small benign-appearing renal cysts are seen bilaterally. No masses identified. No evidence of hydronephrosis. Stomach/Bowel: Tiny hiatal hernia noted.  Otherwise unremarkable. Vascular/Lymphatic: No pathologically enlarged lymph nodes identified. No acute vascular findings. Other:  None. Musculoskeletal:  No suspicious bone lesions identified. IMPRESSION: Image degradation by motion artifact noted. Cholelithiasis. No radiographic evidence of acute cholecystitis. Mild diffuse biliary ductal dilatation. No evidence of choledocholithiasis, pancreatic mass, or other obstructing etiology apparent. Tiny hiatal hernia. Electronically Signed   By: Danae Orleans M.D.   On: 03/02/2021 17:44   MR ABDOMEN MRCP W WO CONTAST  Result Date: 03/02/2021 CLINICAL DATA:  Right upper quadrant pain. Biliary ductal dilatation  on recent CT. EXAM: MRI ABDOMEN WITHOUT AND WITH CONTRAST (INCLUDING MRCP) TECHNIQUE: Multiplanar multisequence MR imaging of the abdomen was performed both before and after the administration of intravenous contrast. Heavily T2-weighted images of the biliary and pancreatic ducts were obtained, and three-dimensional MRCP images were rendered by post processing. CONTRAST:  10mL GADAVIST GADOBUTROL 1 MMOL/ML IV SOLN COMPARISON:  CT on 03/02/2021 FINDINGS: Lower chest: No acute findings. Hepatobiliary: Image degradation by motion artifact noted. No hepatic masses identified. Numerous tiny less than 1 cm gallstones are seen filling the gallbladder. No evidence of gallbladder wall thickening or pericholecystic inflammatory changes. Mild diffuse biliary ductal dilatation is seen with common bile duct measuring 11 mm in diameter. However, there is no evidence of choledocholithiasis or biliary stricture. Pancreas: No evidence of pancreatic mass or inflammatory  changes. Although limited by motion artifact, there is no evidence of pancreatic ductal dilatation. Spleen:  Within normal limits in size and appearance. Adrenals/Urinary Tract: Several small benign-appearing renal cysts are seen bilaterally. No masses identified. No evidence of hydronephrosis. Stomach/Bowel: Tiny hiatal hernia noted.  Otherwise unremarkable. Vascular/Lymphatic: No pathologically enlarged lymph nodes identified. No acute vascular findings. Other:  None. Musculoskeletal:  No suspicious bone lesions identified. IMPRESSION: Image degradation by motion artifact noted. Cholelithiasis. No radiographic evidence of acute cholecystitis. Mild diffuse biliary ductal dilatation. No evidence of choledocholithiasis, pancreatic mass, or other obstructing etiology apparent. Tiny hiatal hernia. Electronically Signed   By: Danae Orleans M.D.   On: 03/02/2021 17:44     Assessment & Plan:  Traci Chandler is a 65 y.o. female with biliary colic. LFTs all remain normal and MRCP reassuring. No stones or masses. She is feeling much better.   Plan to see her in the office and get her scheduled with for a laparoscopic cholecystectomy in the upcoming days that works for her and her sister. Reasons to return to the ED given to the patient.  She tolerated a diet prior to d.c.   All questions were answered to the satisfaction of the patient.   [name] 03/03/2021, 2:35 PM

## 2021-03-03 NOTE — ED Notes (Signed)
Meal tray ordered, pt tolerating po water without n/v or pain.

## 2021-03-06 ENCOUNTER — Emergency Department (HOSPITAL_COMMUNITY): Payer: Medicare Other

## 2021-03-06 ENCOUNTER — Other Ambulatory Visit: Payer: Self-pay

## 2021-03-06 ENCOUNTER — Encounter (HOSPITAL_COMMUNITY): Payer: Self-pay | Admitting: Emergency Medicine

## 2021-03-06 ENCOUNTER — Inpatient Hospital Stay (HOSPITAL_COMMUNITY)
Admission: EM | Admit: 2021-03-06 | Discharge: 2021-03-09 | DRG: 418 | Disposition: A | Discharge status: Home / Self Care | Payer: Medicare Other | Attending: General Surgery | Admitting: General Surgery

## 2021-03-06 DIAGNOSIS — R1011 Right upper quadrant pain: Secondary | ICD-10-CM | POA: Diagnosis not present

## 2021-03-06 DIAGNOSIS — Z7982 Long term (current) use of aspirin: Secondary | ICD-10-CM | POA: Diagnosis not present

## 2021-03-06 DIAGNOSIS — K8050 Calculus of bile duct without cholangitis or cholecystitis without obstruction: Secondary | ICD-10-CM

## 2021-03-06 DIAGNOSIS — Z20822 Contact with and (suspected) exposure to covid-19: Secondary | ICD-10-CM | POA: Diagnosis present

## 2021-03-06 DIAGNOSIS — K8000 Calculus of gallbladder with acute cholecystitis without obstruction: Secondary | ICD-10-CM | POA: Diagnosis not present

## 2021-03-06 DIAGNOSIS — K8021 Calculus of gallbladder without cholecystitis with obstruction: Secondary | ICD-10-CM

## 2021-03-06 DIAGNOSIS — I10 Essential (primary) hypertension: Secondary | ICD-10-CM | POA: Diagnosis not present

## 2021-03-06 DIAGNOSIS — K8020 Calculus of gallbladder without cholecystitis without obstruction: Secondary | ICD-10-CM

## 2021-03-06 DIAGNOSIS — K219 Gastro-esophageal reflux disease without esophagitis: Secondary | ICD-10-CM | POA: Diagnosis present

## 2021-03-06 DIAGNOSIS — K810 Acute cholecystitis: Secondary | ICD-10-CM | POA: Diagnosis present

## 2021-03-06 DIAGNOSIS — R7881 Bacteremia: Secondary | ICD-10-CM | POA: Diagnosis present

## 2021-03-06 DIAGNOSIS — Z885 Allergy status to narcotic agent status: Secondary | ICD-10-CM

## 2021-03-06 DIAGNOSIS — Z833 Family history of diabetes mellitus: Secondary | ICD-10-CM | POA: Diagnosis not present

## 2021-03-06 DIAGNOSIS — Z888 Allergy status to other drugs, medicaments and biological substances status: Secondary | ICD-10-CM | POA: Diagnosis not present

## 2021-03-06 DIAGNOSIS — K8012 Calculus of gallbladder with acute and chronic cholecystitis without obstruction: Principal | ICD-10-CM | POA: Diagnosis present

## 2021-03-06 DIAGNOSIS — B9620 Unspecified Escherichia coli [E. coli] as the cause of diseases classified elsewhere: Secondary | ICD-10-CM | POA: Diagnosis present

## 2021-03-06 DIAGNOSIS — I422 Other hypertrophic cardiomyopathy: Secondary | ICD-10-CM | POA: Diagnosis not present

## 2021-03-06 DIAGNOSIS — Z8249 Family history of ischemic heart disease and other diseases of the circulatory system: Secondary | ICD-10-CM

## 2021-03-06 DIAGNOSIS — R109 Unspecified abdominal pain: Secondary | ICD-10-CM

## 2021-03-06 DIAGNOSIS — K8071 Calculus of gallbladder and bile duct without cholecystitis with obstruction: Secondary | ICD-10-CM | POA: Diagnosis not present

## 2021-03-06 DIAGNOSIS — R110 Nausea: Secondary | ICD-10-CM | POA: Diagnosis not present

## 2021-03-06 LAB — URINALYSIS, ROUTINE W REFLEX MICROSCOPIC
Bilirubin Urine: NEGATIVE
Glucose, UA: NEGATIVE mg/dL
Ketones, ur: NEGATIVE mg/dL
Nitrite: NEGATIVE
Protein, ur: NEGATIVE mg/dL
Specific Gravity, Urine: 1.016 (ref 1.005–1.030)
WBC, UA: >50 WBC/hpf — ABNORMAL HIGH (ref 0–5)
pH: 5 (ref 5.0–8.0)

## 2021-03-06 LAB — CBC WITH DIFFERENTIAL/PLATELET
Abs Immature Granulocytes: 0.01 10*3/uL (ref 0.00–0.07)
Basophils Absolute: 0 10*3/uL (ref 0.0–0.1)
Basophils Relative: 0 %
Eosinophils Absolute: 0 10*3/uL (ref 0.0–0.5)
Eosinophils Relative: 1 %
HCT: 43.2 % (ref 36.0–46.0)
Hemoglobin: 14.2 g/dL (ref 12.0–15.0)
Immature Granulocytes: 0 %
Lymphocytes Relative: 9 %
Lymphs Abs: 0.7 10*3/uL (ref 0.7–4.0)
MCH: 30.2 pg (ref 26.0–34.0)
MCHC: 32.9 g/dL (ref 30.0–36.0)
MCV: 91.9 fL (ref 80.0–100.0)
Monocytes Absolute: 0.2 10*3/uL (ref 0.1–1.0)
Monocytes Relative: 3 %
Neutro Abs: 6.7 10*3/uL (ref 1.7–7.7)
Neutrophils Relative %: 87 %
Platelets: 161 10*3/uL (ref 150–400)
RBC: 4.7 MIL/uL (ref 3.87–5.11)
RDW: 12.4 % (ref 11.5–15.5)
WBC: 7.6 10*3/uL (ref 4.0–10.5)
nRBC: 0 % (ref 0.0–0.2)

## 2021-03-06 LAB — COMPREHENSIVE METABOLIC PANEL
ALT: 124 U/L — ABNORMAL HIGH (ref 0–44)
AST: 180 U/L — ABNORMAL HIGH (ref 15–41)
Albumin: 4.1 g/dL (ref 3.5–5.0)
Alkaline Phosphatase: 106 U/L (ref 38–126)
Anion gap: 6 (ref 5–15)
BUN: 30 mg/dL — ABNORMAL HIGH (ref 8–23)
CO2: 27 mmol/L (ref 22–32)
Calcium: 9.5 mg/dL (ref 8.9–10.3)
Chloride: 100 mmol/L (ref 98–111)
Creatinine, Ser: 1.62 mg/dL — ABNORMAL HIGH (ref 0.44–1.00)
GFR, Estimated: 35 mL/min — ABNORMAL LOW (ref >60)
Glucose, Bld: 113 mg/dL — ABNORMAL HIGH (ref 70–99)
Potassium: 3.9 mmol/L (ref 3.5–5.1)
Sodium: 133 mmol/L — ABNORMAL LOW (ref 135–145)
Total Bilirubin: 2.3 mg/dL — ABNORMAL HIGH (ref 0.3–1.2)
Total Protein: 8.1 g/dL (ref 6.5–8.1)

## 2021-03-06 LAB — RESP PANEL BY RT-PCR (FLU A&B, COVID) ARPGX2
Influenza A by PCR: NEGATIVE
Influenza B by PCR: NEGATIVE
SARS Coronavirus 2 by RT PCR: NEGATIVE

## 2021-03-06 LAB — SURGICAL PCR SCREEN
MRSA, PCR: NEGATIVE
Staphylococcus aureus: NEGATIVE

## 2021-03-06 LAB — LIPASE, BLOOD: Lipase: 33 U/L (ref 11–51)

## 2021-03-06 MED ORDER — PANTOPRAZOLE SODIUM 40 MG IV SOLR
40.0000 mg | Freq: Every day | INTRAVENOUS | Status: DC
  Administered 2021-03-06 – 2021-03-08 (×3): 40 mg via INTRAVENOUS
  Filled 2021-03-06 (×3): qty 40

## 2021-03-06 MED ORDER — METOPROLOL TARTRATE 5 MG/5ML IV SOLN: 5.0000 mg | Freq: Four times a day (QID) | INTRAVENOUS | Status: DC | PRN

## 2021-03-06 MED ORDER — ZOLPIDEM TARTRATE 5 MG PO TABS: 5.0000 mg | ORAL_TABLET | Freq: Every evening | ORAL | Status: DC | PRN

## 2021-03-06 MED ORDER — ACETAMINOPHEN 325 MG PO TABS
650.0000 mg | ORAL_TABLET | Freq: Four times a day (QID) | ORAL | Status: DC | PRN
  Administered 2021-03-06: 650 mg via ORAL
  Filled 2021-03-06: qty 2

## 2021-03-06 MED ORDER — OXYCODONE HCL 5 MG PO TABS
5.0000 mg | ORAL_TABLET | ORAL | Status: DC | PRN
  Administered 2021-03-07: 5 mg via ORAL
  Filled 2021-03-06: qty 1

## 2021-03-06 MED ORDER — MORPHINE SULFATE (PF) 2 MG/ML IV SOLN: 2.0000 mg | INTRAVENOUS | Status: DC | PRN

## 2021-03-06 MED ORDER — SIMETHICONE 80 MG PO CHEW: 40.0000 mg | CHEWABLE_TABLET | Freq: Four times a day (QID) | ORAL | Status: DC | PRN

## 2021-03-06 MED ORDER — SODIUM CHLORIDE 0.9 % IV BOLUS
1000.0000 mL | Freq: Once | INTRAVENOUS | Status: AC
  Administered 2021-03-06: 1000 mL via INTRAVENOUS

## 2021-03-06 MED ORDER — ONDANSETRON 4 MG PO TBDP: 4.0000 mg | ORAL_TABLET | Freq: Four times a day (QID) | ORAL | Status: DC | PRN

## 2021-03-06 MED ORDER — DIPHENHYDRAMINE HCL 12.5 MG/5ML PO ELIX: 12.5000 mg | ORAL_SOLUTION | Freq: Four times a day (QID) | ORAL | Status: DC | PRN

## 2021-03-06 MED ORDER — DIPHENHYDRAMINE HCL 50 MG/ML IJ SOLN: 12.5000 mg | Freq: Four times a day (QID) | INTRAMUSCULAR | Status: DC | PRN

## 2021-03-06 MED ORDER — SODIUM CHLORIDE 0.9 % IV SOLN
2.0000 g | INTRAVENOUS | Status: AC
  Administered 2021-03-07: 2 g via INTRAVENOUS
  Filled 2021-03-06: qty 2

## 2021-03-06 MED ORDER — ONDANSETRON HCL 4 MG/2ML IJ SOLN: 4.0000 mg | Freq: Four times a day (QID) | INTRAMUSCULAR | Status: DC | PRN

## 2021-03-06 MED ORDER — PIPERACILLIN-TAZOBACTAM 3.375 G IVPB
3.3750 g | Freq: Three times a day (TID) | INTRAVENOUS | Status: DC
  Administered 2021-03-06 – 2021-03-09 (×6): 3.375 g via INTRAVENOUS
  Filled 2021-03-06 (×7): qty 50

## 2021-03-06 MED ORDER — ONDANSETRON HCL 4 MG/2ML IJ SOLN
4.0000 mg | Freq: Once | INTRAMUSCULAR | Status: AC
  Administered 2021-03-06: 4 mg via INTRAVENOUS
  Filled 2021-03-06: qty 2

## 2021-03-06 MED ORDER — DOCUSATE SODIUM 100 MG PO CAPS
100.0000 mg | ORAL_CAPSULE | Freq: Two times a day (BID) | ORAL | Status: DC
  Administered 2021-03-06 – 2021-03-09 (×5): 100 mg via ORAL
  Filled 2021-03-06 (×5): qty 1

## 2021-03-06 MED ORDER — MUPIROCIN 2 % EX OINT: 1.0000 "application " | TOPICAL_OINTMENT | Freq: Two times a day (BID) | CUTANEOUS | Status: DC

## 2021-03-06 MED ORDER — HYDROMORPHONE HCL 1 MG/ML IJ SOLN
1.0000 mg | Freq: Once | INTRAMUSCULAR | Status: AC
  Administered 2021-03-06: 1 mg via INTRAVENOUS
  Filled 2021-03-06: qty 1

## 2021-03-06 MED ORDER — LACTATED RINGERS IV SOLN: INTRAVENOUS | Status: DC

## 2021-03-06 MED ORDER — CHLORHEXIDINE GLUCONATE CLOTH 2 % EX PADS
6.0000 | MEDICATED_PAD | Freq: Once | CUTANEOUS | Status: AC
  Administered 2021-03-06: 6 via TOPICAL

## 2021-03-06 MED ORDER — ACETAMINOPHEN 650 MG RE SUPP: 650.0000 mg | Freq: Four times a day (QID) | RECTAL | Status: DC | PRN

## 2021-03-06 MED ORDER — HEPARIN SODIUM (PORCINE) 5000 UNIT/ML IJ SOLN
5000.0000 [IU] | Freq: Three times a day (TID) | INTRAMUSCULAR | Status: DC
  Administered 2021-03-07: 5000 [IU] via SUBCUTANEOUS
  Filled 2021-03-06: qty 1

## 2021-03-06 MED ORDER — CHLORHEXIDINE GLUCONATE CLOTH 2 % EX PADS: 6.0000 | MEDICATED_PAD | Freq: Once | CUTANEOUS | Status: DC

## 2021-03-06 NOTE — ED Triage Notes (Signed)
Pt with c/o epigastric pain. Seen here 03/02/21 for same.

## 2021-03-06 NOTE — H&P (Signed)
[facility] History and Physical  Reason for Referral: Cholelithiasis, elevated LFTs  Chief Complaint   Abdominal Pain     Traci Chandler is a 65 y.o. female.  HPI: Traci Chandler is a 65 yo I saw in the Ed last week and had a workup with MRCP that demonstrated she may have passed a stone as no choledocholithiasis was noted on her MRCP. 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 We set her up for plans for outpatient follow up and cholecystectomy. Over the weekend her pain continued and she has continued to have nausea. She had Lfts that were more elevated and [place] was done that demonstrated stones but no acute cholecystitis. Given her rise in LFTs I am worried she is passing stones. We discussed going ahead and  proceeding with cholecystectomy this admission.   Past Medical History:  Diagnosis Date   Essential hypertension    GERD (gastroesophageal reflux disease)    Hypertrophic non-obstructive cardiomyopathy (HCC)    Echocardiogram 2011    Past Surgical History:  Procedure Laterality Date   ABDOMINAL HYSTERECTOMY     COLONOSCOPY N/A 04/15/2013   Procedure: COLONOSCOPY;  Surgeon: West Bali, MD;  [facility];  Service: Endoscopy;  Laterality: N/A;  9:30    Family History  Problem Relation Age of Onset   Diabetes type II Mother    Hypertension Mother    Diabetes type II Sister    Hypertension Sister    Prostate cancer Father    Colon cancer Neg Hx     Social History   Tobacco Use   Smoking status: Never   Smokeless tobacco: Never  Vaping Use   Vaping Use: Never used  Substance Use Topics   Alcohol use: No   Drug use: No    Medications: I have reviewed the patient's current medications. Prior to Admission:  Medications Prior to Admission  Medication Sig Dispense Refill Last Dose   aspirin 81 MG tablet Take 81 mg by mouth every evening.    03/05/2021   atenolol (TENORMIN) 50  MG tablet Take 1 tablet by mouth daily.  5 03/05/2021 at 0900   NIFEdipine (PROCARDIA XL/NIFEDICAL XL) 60 MG 24 hr tablet Take 60 mg by mouth daily.   03/05/2021   omeprazole (PRILOSEC) 20 MG capsule TAKE 1 CAPSULE 2 TIMES A DAY BEFORE A MEAL. (Patient taking differently: Take 20 mg by mouth 2 (two) times daily before a meal.) 60 capsule 3 03/05/2021   polyethylene glycol (MIRALAX / GLYCOLAX) 17 g packet Take 17 g by mouth daily.   03/05/2021   spironolactone (ALDACTONE) 25 MG tablet Take 25 mg by mouth 2 (two) times daily.   03/05/2021   docusate sodium (COLACE) 100 MG capsule Take 1 capsule (100 mg total) by mouth daily as needed for mild constipation. (Patient not taking: No sig reported) 90 capsule 0 Not Taking   tiZANidine (ZANAFLEX) 2 MG tablet Take 1-2 tablets (2-4 mg total) by mouth every 8 (eight) hours as needed for muscle spasms. 20 tablet 0 Unknown   Scheduled:  Chlorhexidine Gluconate Cloth  6 each Topical Once   And   Chlorhexidine Gluconate Cloth  6 each Topical Once   docusate sodium  100 mg Oral BID   [START ON 03/07/2021] heparin  5,000 Units Subcutaneous Q8H   mupirocin ointment  1 application Nasal BID   pantoprazole (PROTONIX) IV  40 mg  Intravenous QHS   Continuous:  [START ON 03/07/2021] cefoTEtan (CEFOTAN) IV     lactated ringers 50 mL/hr at 03/06/21 1243   DXI:[id number] **OR** acetaminophen, diphenhydrAMINE **OR** diphenhydrAMINE, metoprolol tartrate, morphine injection, ondansetron **OR** ondansetron (ZOFRAN) IV, oxyCODONE, simethicone, zolpidem Allergies  Allergen Reactions   Codeine Nausea And Vomiting   Diclofenac    Lisinopril Other (See Comments)      ROS:  A comprehensive review of systems was negative except for: Gastrointestinal: positive for abdominal pain and nausea  Blood pressure (!) 145/60, pulse 79, temperature 98.1 F (36.7 C), temperature source Oral, resp. rate 18, height 5\' 6"  (1.676 m), weight 109.8 kg, SpO2 97 %. Physical Exam Vitals  reviewed.  Constitutional:      Appearance: She is well-developed.  HENT:     Head: Normocephalic.  Cardiovascular:     Rate and Rhythm: Normal rate and regular rhythm.  Pulmonary:     Effort: Pulmonary effort is normal.  Abdominal:     General: There is no distension.     Palpations: Abdomen is soft.     Tenderness: There is abdominal tenderness in the right upper quadrant.  Musculoskeletal:     Comments: Moves all extremities   Skin:    General: Skin is warm.  Neurological:     General: No focal deficit present.     Mental Status: She is alert and oriented to person, place, and time.  Psychiatric:        Mood and Affect: Mood normal.        Behavior: Behavior normal.    Results: Results for orders placed or performed during the hospital encounter of 03/06/21 (from the past 48 hour(s))  CBC with Differential/Platelet     Status: None   Collection Time: 03/06/21  3:31 AM  Result Value Ref Range   WBC 7.6 4.0 - 10.5 K/uL   RBC 4.70 3.87 - 5.11 MIL/uL   Hemoglobin 14.2 12.0 - 15.0 g/dL   HCT 03/08/21 41.9 - 37.9 %   MCV 91.9 80.0 - 100.0 fL   MCH 30.2 26.0 - 34.0 pg   MCHC 32.9 30.0 - 36.0 g/dL   RDW 02.4 09.7 - 35.3 %   Platelets 161 150 - 400 K/uL   nRBC 0.0 0.0 - 0.2 %   Neutrophils Relative % 87 %   Neutro Abs 6.7 1.7 - 7.7 K/uL   Lymphocytes Relative 9 %   Lymphs Abs 0.7 0.7 - 4.0 K/uL   Monocytes Relative 3 %   Monocytes Absolute 0.2 0.1 - 1.0 K/uL   Eosinophils Relative 1 %   Eosinophils Absolute 0.0 0.0 - 0.5 K/uL   Basophils Relative 0 %   Basophils Absolute 0.0 0.0 - 0.1 K/uL   Immature Granulocytes 0 %   Abs Immature Granulocytes 0.01 0.00 - 0.07 K/uL    Comment: Performed at [facility], [address], [place], [place]  Comprehensive metabolic panel     Status: Abnormal   Collection Time: 03/06/21  3:31 AM  Result Value Ref Range   Sodium 133 (L) 135 - 145 mmol/L   Potassium 3.9 3.5 - 5.1 mmol/L   Chloride 100 98 - 111 mmol/L   CO2 27 22 -  32 mmol/L   Glucose, Bld 113 (H) 70 - 99 mg/dL    Comment: Glucose reference range applies only to samples taken after fasting for at least 8 hours.   BUN 30 (H) 8 - 23 mg/dL   Creatinine, Ser 03/08/21 (H)  0.44 - 1.00 mg/dL   Calcium 9.5 8.9 - 99.2 mg/dL   Total Protein 8.1 6.5 - 8.1 g/dL   Albumin 4.1 3.5 - 5.0 g/dL   AST 426 (H) 15 - 41 U/L   ALT 124 (H) 0 - 44 U/L   Alkaline Phosphatase 106 38 - 126 U/L   Total Bilirubin 2.3 (H) 0.3 - 1.2 mg/dL   GFR, Estimated 35 (L) >60 mL/min    Comment: (NOTE) Calculated using the CKD-EPI Creatinine Equation (2021)    Anion gap 6 5 - 15    Comment: Performed at [facility], [address], [place], [place] [address]  Lipase, blood     Status: None   Collection Time: 03/06/21  3:31 AM  Result Value Ref Range   Lipase 33 11 - 51 U/L    Comment: Performed at [facility], [address] [place], [place] [address]  Urinalysis, Routine w reflex microscopic     Status: Abnormal   Collection Time: 03/06/21  3:51 AM  Result Value Ref Range   Color, Urine AMBER (A) YELLOW    Comment: BIOCHEMICALS MAY BE AFFECTED BY COLOR   APPearance CLOUDY (A) CLEAR   Specific Gravity, Urine 1.016 1.005 - 1.030   pH 5.0 5.0 - 8.0   Glucose, UA NEGATIVE NEGATIVE mg/dL   Hgb urine dipstick SMALL (A) NEGATIVE   Bilirubin Urine NEGATIVE NEGATIVE   Ketones, ur NEGATIVE NEGATIVE mg/dL   Protein, ur NEGATIVE NEGATIVE mg/dL   Nitrite NEGATIVE NEGATIVE   Leukocytes,Ua LARGE (A) NEGATIVE   RBC / HPF 21-50 0 - 5 RBC/hpf   WBC, UA >50 (H) 0 - 5 WBC/hpf   Bacteria, UA RARE (A) NONE SEEN   Squamous Epithelial / LPF 6-10 0 - 5   WBC Clumps PRESENT    Mucus PRESENT    Budding Yeast PRESENT     Comment: Performed at [facility], [address], [place], [place] [address]  Resp Panel by RT-PCR (Flu A&B, Covid) Nasopharyngeal Swab     Status: None   Collection Time: 03/06/21 10:40 AM   Specimen: Nasopharyngeal Swab; Nasopharyngeal(NP) swabs in vial transport medium  Result  Value Ref Range   SARS Coronavirus 2 by RT PCR NEGATIVE NEGATIVE    Comment: (NOTE) SARS-CoV-2 target nucleic acids are NOT DETECTED.  The SARS-CoV-2 RNA is generally detectable in upper respiratory specimens during the acute phase of infection. The lowest concentration of SARS-CoV-2 viral copies this assay can detect is 138 copies/mL. A negative result does not preclude SARS-Cov-2 infection and should not be used as the sole basis for treatment or other patient management decisions. A negative result may occur with  improper specimen collection/handling, submission of specimen other than nasopharyngeal swab, presence of viral mutation(s) within the areas targeted by this assay, and inadequate number of viral copies(<138 copies/mL). A negative result must be combined with clinical observations, patient history, and epidemiological information. The expected result is Negative.  Fact Sheet for Patients:  BloggerCourse.com  Fact Sheet for Healthcare Providers:  SeriousBroker.it  This test is no t yet approved or cleared by the [place] FDA and  has been authorized for detection and/or diagnosis of SARS-CoV-2 by FDA under an Emergency Use Authorization (EUA). This EUA will remain  in effect (meaning this test can be used) for the duration of the COVID-19 declaration under Section 564(b)(1) of the Act, 21 U.S.C.section 360bbb-3(b)(1), unless the authorization is terminated  or revoked sooner.       Influenza  A by PCR NEGATIVE NEGATIVE   Influenza B by PCR NEGATIVE NEGATIVE    Comment: (NOTE) The Xpert Xpress SARS-CoV-2/FLU/RSV plus assay is intended as an aid in the diagnosis of influenza from Nasopharyngeal swab specimens and should not be used as a sole basis for treatment. Nasal washings and aspirates are unacceptable for Xpert Xpress SARS-CoV-2/FLU/RSV testing.  Fact Sheet for  Patients: BloggerCourse.com  Fact Sheet for Healthcare Providers: SeriousBroker.it  This test is not yet approved or cleared by the [place] FDA and has been authorized for detection and/or diagnosis of SARS-CoV-2 by FDA under an Emergency Use Authorization (EUA). This EUA will remain in effect (meaning this test can be used) for the duration of the COVID-19 declaration under Section 564(b)(1) of the Act, 21 U.S.C. section 360bbb-3(b)(1), unless the authorization is terminated or revoked.  Performed at [facility], [address], [place], [place] [address]    Reviewed- stones in the gallbladder  US Abdomen Limited RUQ (LIVER/GB)  Result Date: 03/06/2021 CLINICAL DATA:  Increased LFTs, nausea x6 months EXAM: ULTRASOUND ABDOMEN LIMITED RIGHT UPPER QUADRANT COMPARISON:  CT, MRI September 2022 FINDINGS: Gallbladder: Gallstone filled gallbladder. The gallbladder wall measures 4 mm in thickness. No pericholecystic fluid. Common bile duct: Diameter: 5 mm Liver: No focal lesion identified. Within normal limits in parenchymal echogenicity. Portal vein is patent on color Doppler imaging with normal direction of blood flow towards the liver. Other: None. IMPRESSION: Gallstone filled gallbladder without acute inflammatory changes. The liver appears normal. Electronically Signed   By: Olive Bass M.D.   On: 03/06/2021 09:26     Assessment & Plan:  ORLA ESTRIN is a 65 y.o. female with symptomatic cholelithiasis and rising LFTs concerning for choledocholithiasis. We have discussed proceeding with laparoscopic cholecystectomy and possible cholangiogram tomorrow pending her LFTs in the AM. She has a history of nonobstructive hypertrophic cardiomyopathy but no chest pain, SOB or swelling issues. She follows with cardiology annually.    -PLAN: I counseled the patient about the indication, risks and benefits of laparoscopic cholecystectomy.  She  understands there is a very small chance for bleeding, infection, injury to normal structures (including common bile duct), conversion to open surgery, persistent symptoms, evolution of postcholecystectomy diarrhea, need for secondary interventions, anesthesia reaction, cardiopulmonary issues and other risks not specifically detailed here. Discussed cholangiogram and dye use. I described the expected recovery, the plan for follow-up and the restrictions during the recovery phase.  All questions were answered.  NPO midnight EKG with no significant change 9/15  All questions were answered to the satisfaction of the patient.    [name] 03/06/2021, 3:54 PM

## 2021-03-06 NOTE — Progress Notes (Signed)
   03/06/21 1736  Vitals  Temp (!) 102.8 F (39.3 C)  Temp Source Oral  BP 138/69  MAP (mmHg) 89  BP Location Left Wrist  BP Method Automatic  Patient Position (if appropriate) Lying  Pulse Rate 88  Resp 18  Level of Consciousness  Level of Consciousness Alert  MEWS COLOR  MEWS Score Color Yellow  Oxygen Therapy  SpO2 97 %  O2 Device Room Air  Pain Assessment  Pain Scale 0-10  Pain Score 0  POSS Scale (Pasero Opioid Sedation Scale)  POSS *See Group Information* 1-Acceptable,Awake and alert  Complaints & Interventions  Complains of Other (Comment) (No complaints at this time)  Interventions Medication (see MAR) (Tylenol given prn for fever)  MEWS Score  MEWS Temp 2  MEWS Systolic 0  MEWS Pulse 0  MEWS RR 0  MEWS LOC 0  MEWS Score 2  Provider Notification  Provider Name/Title Dr. Algis Greenhouse  Date Provider Notified 03/06/21  Time Provider Notified 1756  Notification Type Page  Notification Reason Other (Comment) (yellow MEWS)  Provider response See new orders  Date of Provider Response 03/06/21  Time of Provider Response 1758   Patient is up ad lib, denies pain at this time.  No new complaints.  Will continue to monitor.

## 2021-03-06 NOTE — Plan of Care (Signed)

## 2021-03-06 NOTE — ED Notes (Signed)
Day surgery called and stated to keep patient NPO.

## 2021-03-06 NOTE — ED Notes (Signed)
Ultrasound complete 

## 2021-03-06 NOTE — Progress Notes (Signed)
Pharmacy Antibiotic Note  Traci Chandler is a 65 y.o. female admitted on 03/06/2021 with abdominal pain; lap cholecystectomy planned for tomorrow. Pharmacy has been consulted for Zosyn dosing for cholecystitis, possible cholangitis.  WBC 7.6, Tmax 102.8 F; Scr1.62, CrCl 42.3 ml/min (up from 1.06 on 03/03/21)  Plan: Zosyn 3.375 gm IV Q 8 hrs (extended infusion) Monitor WBC, temp, clinical course, renal function  Height: 5\' 6"  (167.6 cm) Weight: 109.8 kg (242 lb 1 oz) IBW/kg (Calculated) : 59.3  Temp (24hrs), Avg:100.5 F (38.1 C), Min:98.1 F (36.7 C), Max:102.8 F (39.3 C)  Recent Labs  Lab 03/02/21 1214 03/03/21 0433 03/06/21 0331  WBC 7.2 8.1 7.6  CREATININE 1.23* 1.06* 1.62*    Estimated Creatinine Clearance: 42.3 mL/min (A) (by C-G formula based on SCr of 1.62 mg/dL (H)).    Allergies  Allergen Reactions   Codeine Nausea And Vomiting   Diclofenac    Lisinopril Other (See Comments)    Antimicrobials this admission: Zosyn 9/19 >>  Microbiology results: 9/19 COVID, flu A, flu B: negative 9/19 MRSA PCR: negative 9/19 Staph aureus nasal swab: negative  Thank you for allowing pharmacy to be a part of this patient's care.  10/19, PharmD, BCPS, [facility] Clinical Pharmacist 03/06/2021 6:04 PM

## 2021-03-06 NOTE — ED Provider Notes (Signed)
Care of the patient assumed at the change of shift. Here for RUQ pain, has known cholelithiasis, recent MRCP neg for cholecystitis. Now with elevated liver enzymes. Pending [place] and Gen Surg consult.  Physical Exam  BP 130/65   Pulse 80   Temp 98.8 F (37.1 C) (Oral)   Resp 17   Ht 5\' 6"  (1.676 m)   Wt 109 kg   SpO2 93%   BMI 38.79 kg/m   Physical Exam  ED Course/Procedures   Clinical Course as of 03/06/21 1040  Mon Mar 06, 2021  0916 UA concerning for infection, although patient denies any dysuria, hematuria, frequency, flank pain, or vaginal symptoms. [CS]  [phone] shows gall stones, no signs of cholecystitis. Will discuss with General Surgery.  [CS]  1040 Dr. [place] will plan admission. NPO for now. Covid ordered.  [CS]    Clinical Course User Index [CS] Henreitta Leber, MD    Procedures  MDM         Pollyann Savoy, MD 03/06/21 1041

## 2021-03-06 NOTE — ED Provider Notes (Signed)
[facility] EMERGENCY DEPARTMENT Provider Note   CSN: [id number] Arrival date & time: 03/06/21  0153     History Chief Complaint  Patient presents with   Abdominal Pain    Traci Chandler is a 65 y.o. female.  Patient returns with upper abdominal pain.  This is been constant since she was seen on 9-15 and diagnosed with gallstones.  She was going to have an outpatient cholecystectomy but this is not scheduled yet.  She had a CT scan as well as MRCP that showed cholelithiasis without cholecystitis.  States she is been in pain ever since she went home.  She is taking hydrocodone every 4 hours without relief.  Nausea but no vomiting.  No fever.  No pain with urination or blood in the urine.  No vaginal bleeding or discharge.  No chest pain or shortness of breath. States this pain is the same but never really went away since she went home on the 16th.  The history is provided by the patient.  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     Family History  Problem Relation Age of Onset   Diabetes type II Mother    Hypertension Mother    Diabetes type II Sister    Hypertension Sister    Prostate cancer Father    Colon cancer Neg Hx     Social History   Tobacco Use   Smoking status: Never   Smokeless tobacco: Never  Vaping Use   Vaping Use: Never used  Substance Use Topics   Alcohol use: No   Drug use: No    Home Medications Prior to Admission medications   Medication Sig Start Date End Date Taking? Authorizing Provider  aspirin 81 MG tablet Take 81 mg by mouth every evening.     [provider]  atenolol (TENORMIN) 50 MG tablet Take 1 tablet by mouth daily. 02/11/18   [provider]  docusate sodium (COLACE) 100 MG capsule Take 1 capsule (100 mg total) by mouth daily as needed for mild constipation. Patient not taking: Reported on 03/03/2021 12/06/20 03/06/21  Edwin Cap, DPM  NIFEdipine (PROCARDIA XL/NIFEDICAL XL) 60 MG 24 hr tablet Take 60 mg by mouth daily. 07/09/19   [provider]  omeprazole (PRILOSEC) 20 MG capsule TAKE 1 CAPSULE 2 TIMES A DAY BEFORE A MEAL. 08/10/19   Anice Paganini, NP  polyethylene glycol (MIRALAX / GLYCOLAX) 17 g packet Take 17 g by mouth daily.  [provider]  spironolactone (ALDACTONE) 25 MG tablet Take 25 mg by mouth 2 (two) times daily.    [provider]  tiZANidine (ZANAFLEX) 2 MG tablet Take 1-2 tablets (2-4 mg total) by mouth every 8 (eight) hours as needed for muscle spasms. Patient not taking: Reported on 03/03/2021 11/28/20   Bing Neighbors, FNP    Allergies    Codeine, Diclofenac, and Lisinopril  Review of Systems   Review of Systems  Constitutional:  Positive for activity change and appetite change. Negative for fever.  HENT:  Negative for congestion and rhinorrhea.   Respiratory:  Negative for cough, chest tightness and shortness of breath.   Gastrointestinal:  Positive for abdominal  pain and nausea. Negative for vomiting.  Genitourinary:  Negative for dysuria and hematuria.  Musculoskeletal:  Negative for arthralgias and myalgias.  Skin:  Negative for rash.  Neurological:  Negative for dizziness, weakness and headaches.   all other systems are negative except as noted in the HPI and PMH.   Physical Exam Updated Vital Signs BP (!) 147/72 (BP Location: Right Arm)   Pulse 75   Temp 98.8 F (37.1 C) (Oral)   Resp 20   Ht 5\' 6"  (1.676 m)   Wt 109 kg   SpO2 98%   BMI 38.79 kg/m   Physical Exam Vitals and nursing note reviewed.  Constitutional:      General: She is not in acute distress.    Appearance: She is well-developed.  HENT:     Head: Normocephalic and atraumatic.     Mouth/Throat:     Pharynx: No oropharyngeal exudate.  Eyes:     Conjunctiva/sclera: Conjunctivae normal.     Pupils: Pupils are equal, round, and reactive to light.  Neck:     Comments: No meningismus. Cardiovascular:     Rate and Rhythm: Normal rate and regular rhythm.     Heart sounds: Normal heart sounds. No murmur heard. Pulmonary:     Effort: Pulmonary effort is normal. No respiratory distress.     Breath sounds: Normal breath sounds.  Abdominal:     Palpations: Abdomen is soft.     Tenderness: There is abdominal tenderness. There is guarding. There is no rebound.     Comments: Epigastric and RUQ tenderness, guarding  Musculoskeletal:        General: No tenderness. Normal range of motion.     Cervical back: Normal range of motion and neck supple.  Skin:    General: Skin is warm.  Neurological:     Mental Status: She is alert and oriented to person, place, and time.     Cranial Nerves: No cranial nerve deficit.     Motor: No abnormal muscle tone.     Coordination: Coordination normal.     Comments:  5/5 strength throughout. CN 2-12 intact.Equal grip strength.   Psychiatric:        Behavior: Behavior normal.    ED Results / Procedures / Treatments   Labs (all labs  ordered are listed, but only abnormal results are displayed) Labs Reviewed  CBC WITH DIFFERENTIAL/PLATELET  COMPREHENSIVE METABOLIC PANEL  LIPASE, BLOOD  URINALYSIS, ROUTINE W REFLEX MICROSCOPIC    EKG None  Radiology No results found.  Procedures Procedures   Medications Ordered in ED Medications  HYDROmorphone (DILAUDID) injection 1 mg (has no administration in time range)  ondansetron (ZOFRAN) injection 4 mg (has no administration in time range)  sodium chloride 0.9 % bolus 1,000 mL (has no administration in  time range)    ED Course  I have reviewed the triage vital signs and the nursing notes.  Pertinent labs & imaging results that were available during my care of the patient were reviewed by me and considered in my medical decision making (see chart for details).    MDM Rules/Calculators/A&P                           Right upper Quadrant and epigastric pain similar to previous episodes of cholelithiasis and biliary colic  No leukocytosis.  Creatinine elevated from baseline.  Will hydrate.  Mild transaminitis and bilirubin 2.3 are new.  Pain improved with medications.  Does have some AKI and is hydrated. No fever or elevated white blood cell count.  Discussed with Dr. Henreitta Leber of general surgery who is feeling with patient.  She states OR schedule might not accommodate cholecystectomy today.  She recommends ultrasound given rising LFTs and hyperbilirubinemia.  She will evaluate. Care to be transferred at shift change Final Clinical Impression(s) / ED Diagnoses Final diagnoses:  None    Rx / DC Orders ED Discharge Orders     None        Signa Cheek, Jeannett Senior, MD 03/06/21 [phone]

## 2021-03-07 ENCOUNTER — Observation Stay (HOSPITAL_COMMUNITY): Payer: Medicare Other | Admitting: Anesthesiology

## 2021-03-07 ENCOUNTER — Observation Stay (HOSPITAL_COMMUNITY): Payer: Medicare Other

## 2021-03-07 ENCOUNTER — Other Ambulatory Visit: Payer: Self-pay

## 2021-03-07 ENCOUNTER — Encounter (HOSPITAL_COMMUNITY)
Admission: EM | Disposition: A | Discharge status: Home / Self Care | Payer: Self-pay | Source: Home / Self Care | Attending: General Surgery

## 2021-03-07 ENCOUNTER — Encounter (HOSPITAL_COMMUNITY): Payer: Self-pay | Admitting: General Surgery

## 2021-03-07 DIAGNOSIS — I422 Other hypertrophic cardiomyopathy: Secondary | ICD-10-CM | POA: Diagnosis not present

## 2021-03-07 DIAGNOSIS — K8000 Calculus of gallbladder with acute cholecystitis without obstruction: Secondary | ICD-10-CM | POA: Diagnosis not present

## 2021-03-07 DIAGNOSIS — K810 Acute cholecystitis: Secondary | ICD-10-CM | POA: Diagnosis not present

## 2021-03-07 HISTORY — PX: CHOLECYSTECTOMY: SHX55

## 2021-03-07 LAB — COMPREHENSIVE METABOLIC PANEL
ALT: 121 U/L — ABNORMAL HIGH (ref 0–44)
AST: 80 U/L — ABNORMAL HIGH (ref 15–41)
Albumin: 3.3 g/dL — ABNORMAL LOW (ref 3.5–5.0)
Alkaline Phosphatase: 88 U/L (ref 38–126)
Anion gap: 7 (ref 5–15)
BUN: 19 mg/dL (ref 8–23)
CO2: 24 mmol/L (ref 22–32)
Calcium: 9.1 mg/dL (ref 8.9–10.3)
Chloride: 103 mmol/L (ref 98–111)
Creatinine, Ser: 1.35 mg/dL — ABNORMAL HIGH (ref 0.44–1.00)
GFR, Estimated: 43 mL/min — ABNORMAL LOW (ref >60)
Glucose, Bld: 102 mg/dL — ABNORMAL HIGH (ref 70–99)
Potassium: 3.8 mmol/L (ref 3.5–5.1)
Sodium: 134 mmol/L — ABNORMAL LOW (ref 135–145)
Total Bilirubin: 6.7 mg/dL — ABNORMAL HIGH (ref 0.3–1.2)
Total Protein: 6.9 g/dL (ref 6.5–8.1)

## 2021-03-07 LAB — BLOOD CULTURE ID PANEL (REFLEXED) - BCID2

## 2021-03-07 SURGERY — LAPAROSCOPIC CHOLECYSTECTOMY WITH INTRAOPERATIVE CHOLANGIOGRAM
Anesthesia: General | Site: Abdomen

## 2021-03-07 MED ORDER — LIDOCAINE HCL (CARDIAC) PF 100 MG/5ML IV SOSY
PREFILLED_SYRINGE | INTRAVENOUS | Status: DC | PRN
Start: 2021-03-07 — End: 2021-03-07
  Administered 2021-03-07: 100 mg via INTRAVENOUS

## 2021-03-07 MED ORDER — DEXAMETHASONE SODIUM PHOSPHATE 10 MG/ML IJ SOLN
INTRAMUSCULAR | Status: AC
  Filled 2021-03-07: qty 1

## 2021-03-07 MED ORDER — BUPIVACAINE HCL (PF) 0.5 % IJ SOLN
INTRAMUSCULAR | Status: AC
  Filled 2021-03-07: qty 30

## 2021-03-07 MED ORDER — CHLORHEXIDINE GLUCONATE 0.12 % MT SOLN: 15.0000 mL | Freq: Once | OROMUCOSAL | Status: DC

## 2021-03-07 MED ORDER — LACTATED RINGERS IV SOLN: INTRAVENOUS | Status: DC

## 2021-03-07 MED ORDER — DEXAMETHASONE SODIUM PHOSPHATE 10 MG/ML IJ SOLN
INTRAMUSCULAR | Status: DC | PRN
  Administered 2021-03-07: 10 mg via INTRAVENOUS

## 2021-03-07 MED ORDER — PROPOFOL 10 MG/ML IV BOLUS
INTRAVENOUS | Status: DC | PRN
  Administered 2021-03-07: 180 mg via INTRAVENOUS

## 2021-03-07 MED ORDER — SODIUM CHLORIDE 0.9 % IV SOLN
INTRAVENOUS | Status: AC
  Filled 2021-03-07: qty 2

## 2021-03-07 MED ORDER — ROCURONIUM BROMIDE 10 MG/ML (PF) SYRINGE
PREFILLED_SYRINGE | INTRAVENOUS | Status: AC
  Filled 2021-03-07: qty 10

## 2021-03-07 MED ORDER — FENTANYL CITRATE PF 50 MCG/ML IJ SOSY
PREFILLED_SYRINGE | INTRAMUSCULAR | Status: AC
  Filled 2021-03-07: qty 1

## 2021-03-07 MED ORDER — ROCURONIUM BROMIDE 100 MG/10ML IV SOLN
INTRAVENOUS | Status: DC | PRN
  Administered 2021-03-07: 50 mg via INTRAVENOUS
  Administered 2021-03-07: 20 mg via INTRAVENOUS

## 2021-03-07 MED ORDER — BUPIVACAINE HCL (PF) 0.5 % IJ SOLN
INTRAMUSCULAR | Status: DC | PRN
  Administered 2021-03-07: 16 mL

## 2021-03-07 MED ORDER — METOPROLOL TARTRATE 5 MG/5ML IV SOLN
INTRAVENOUS | Status: AC
  Filled 2021-03-07: qty 5

## 2021-03-07 MED ORDER — FENTANYL CITRATE PF 50 MCG/ML IJ SOSY
25.0000 ug | PREFILLED_SYRINGE | INTRAMUSCULAR | Status: DC | PRN
  Administered 2021-03-07: 50 ug via INTRAVENOUS

## 2021-03-07 MED ORDER — ORAL CARE MOUTH RINSE: 15.0000 mL | Freq: Once | OROMUCOSAL | Status: DC

## 2021-03-07 MED ORDER — HEMOSTATIC AGENTS (NO CHARGE) OPTIME
TOPICAL | Status: DC | PRN
  Administered 2021-03-07 (×2): 1 via TOPICAL

## 2021-03-07 MED ORDER — FENTANYL CITRATE (PF) 100 MCG/2ML IJ SOLN
INTRAMUSCULAR | Status: DC | PRN
  Administered 2021-03-07: 100 ug via INTRAVENOUS
  Administered 2021-03-07 (×3): 50 ug via INTRAVENOUS

## 2021-03-07 MED ORDER — PROPOFOL 10 MG/ML IV BOLUS
INTRAVENOUS | Status: AC
  Filled 2021-03-07: qty 20

## 2021-03-07 MED ORDER — ONDANSETRON HCL 4 MG/2ML IJ SOLN
INTRAMUSCULAR | Status: DC | PRN
  Administered 2021-03-07: 4 mg via INTRAVENOUS

## 2021-03-07 MED ORDER — ONDANSETRON HCL 4 MG/2ML IJ SOLN
INTRAMUSCULAR | Status: AC
  Filled 2021-03-07: qty 2

## 2021-03-07 MED ORDER — SODIUM CHLORIDE 0.9 % IR SOLN
Status: DC | PRN
  Administered 2021-03-07: 3000 mL
  Administered 2021-03-07: 500 mL

## 2021-03-07 MED ORDER — FENTANYL CITRATE (PF) 250 MCG/5ML IJ SOLN
INTRAMUSCULAR | Status: AC
  Filled 2021-03-07: qty 5

## 2021-03-07 MED ORDER — METOPROLOL TARTRATE 5 MG/5ML IV SOLN
INTRAVENOUS | Status: DC | PRN
  Administered 2021-03-07 (×2): 1 mg via INTRAVENOUS

## 2021-03-07 MED ORDER — HEPARIN SODIUM (PORCINE) 5000 UNIT/ML IJ SOLN: 5000.0000 [IU] | Freq: Three times a day (TID) | INTRAMUSCULAR | Status: DC

## 2021-03-07 MED ORDER — LIDOCAINE HCL (PF) 2 % IJ SOLN
INTRAMUSCULAR | Status: AC
  Filled 2021-03-07: qty 5

## 2021-03-07 MED ORDER — SUGAMMADEX SODIUM 200 MG/2ML IV SOLN
INTRAVENOUS | Status: DC | PRN
  Administered 2021-03-07: 200 mg via INTRAVENOUS

## 2021-03-07 SURGICAL SUPPLY — 56 items
APPLICATOR ARISTA FLEXITIP XL (MISCELLANEOUS) ×2 IMPLANT
APPLIER CLIP ROT 10 11.4 M/L (STAPLE) ×2
BAG RETRIEVAL 10 (BASKET) ×2
BLADE SURG 15 STRL LF DISP TIS (BLADE) ×1 IMPLANT
BLADE SURG 15 STRL SS (BLADE) ×2
CATH CHOLANGIOGRAM (CATHETERS) ×2 IMPLANT
CATH CHOLANGIOGRAM 4.5FR (CATHETERS) ×2 IMPLANT
CHLORAPREP W/TINT 26 (MISCELLANEOUS) ×2 IMPLANT
CLIP APPLIE ROT 10 11.4 M/L (STAPLE) ×1 IMPLANT
CLOTH BEACON ORANGE TIMEOUT ST (SAFETY) ×2 IMPLANT
COVER LIGHT HANDLE STERIS (MISCELLANEOUS) ×4 IMPLANT
DECANTER SPIKE VIAL GLASS SM (MISCELLANEOUS) ×4 IMPLANT
DISSECTOR BLUNT TIP ENDO 5MM (MISCELLANEOUS) ×2 IMPLANT
DRAPE C-ARM FOLDED MOBILE STRL (DRAPES) ×2 IMPLANT
DRSG TEGADERM 2-3/8X2-3/4 SM (GAUZE/BANDAGES/DRESSINGS) ×8 IMPLANT
DRSG TEGADERM 4X4.75 (GAUZE/BANDAGES/DRESSINGS) ×2 IMPLANT
ELECT REM PT RETURN 9FT ADLT (ELECTROSURGICAL) ×2
ELECTRODE REM PT RTRN 9FT ADLT (ELECTROSURGICAL) ×1 IMPLANT
EVACUATOR DRAINAGE 10X20 100CC (DRAIN) ×1 IMPLANT
EVACUATOR SILICONE 100CC (DRAIN) ×2
GLOVE SURG ENC MOIS LTX SZ6.5 (GLOVE) ×2 IMPLANT
GLOVE SURG POLYISO LF SZ7.5 (GLOVE) ×4 IMPLANT
GLOVE SURG UNDER POLY LF SZ6.5 (GLOVE) ×2 IMPLANT
GLOVE SURG UNDER POLY LF SZ7 (GLOVE) ×6 IMPLANT
GOWN STRL REUS W/TWL LRG LVL3 (GOWN DISPOSABLE) ×6 IMPLANT
HEMOSTAT ARISTA ABSORB 3G PWDR (HEMOSTASIS) ×2 IMPLANT
HEMOSTAT SNOW SURGICEL 2X4 (HEMOSTASIS) ×2 IMPLANT
INST SET LAPROSCOPIC AP (KITS) ×2 IMPLANT
KIT TURNOVER KIT A (KITS) ×2 IMPLANT
MANIFOLD NEPTUNE II (INSTRUMENTS) ×2 IMPLANT
NEEDLE INSUFFLATION 120MM (ENDOMECHANICALS) ×2 IMPLANT
NS IRRIG 1000ML POUR BTL (IV SOLUTION) ×2 IMPLANT
PACK LAP CHOLE LZT030E (CUSTOM PROCEDURE TRAY) ×2 IMPLANT
PAD ARMBOARD 7.5X6 YLW CONV (MISCELLANEOUS) ×2 IMPLANT
PENCIL HANDSWITCHING (ELECTRODE) ×2 IMPLANT
SET BASIN LINEN APH (SET/KITS/TRAYS/PACK) ×2 IMPLANT
SET TUBE IRRIG SUCTION NO TIP (IRRIGATION / IRRIGATOR) ×2 IMPLANT
SET TUBE SMOKE EVAC HIGH FLOW (TUBING) ×2 IMPLANT
SLEEVE ENDOPATH XCEL 5M (ENDOMECHANICALS) ×2 IMPLANT
SPONGE DRAIN TRACH 4X4 STRL 2S (GAUZE/BANDAGES/DRESSINGS) ×2 IMPLANT
SPONGE GAUZE 2X2 8PLY STRL LF (GAUZE/BANDAGES/DRESSINGS) ×8 IMPLANT
STAPLER VISISTAT (STAPLE) ×2 IMPLANT
STOPCOCK 4 WAY LG BORE MALE ST (IV SETS) ×2 IMPLANT
SUT ETHILON 3 0 FSL (SUTURE) ×2 IMPLANT
SUT MNCRL AB 4-0 PS2 18 (SUTURE) ×4 IMPLANT
SUT VICRYL 0 UR6 27IN ABS (SUTURE) ×2 IMPLANT
SYR 20ML LL LF (SYRINGE) ×2 IMPLANT
SYR 30ML LL (SYRINGE) ×2 IMPLANT
SYR CONTROL 10ML LL (SYRINGE) ×2 IMPLANT
SYS BAG RETRIEVAL 10MM (BASKET) ×2
SYSTEM BAG RETRIEVAL 10MM (BASKET) ×2 IMPLANT
TROCAR ENDO BLADELESS 11MM (ENDOMECHANICALS) ×2 IMPLANT
TROCAR XCEL NON-BLD 5MMX100MML (ENDOMECHANICALS) ×2 IMPLANT
TROCAR XCEL UNIV SLVE 11M 100M (ENDOMECHANICALS) ×2 IMPLANT
TUBE CONNECTING 12X1/4 (SUCTIONS) ×2 IMPLANT
WARMER LAPAROSCOPE (MISCELLANEOUS) ×2 IMPLANT

## 2021-03-07 NOTE — Progress Notes (Signed)
[facility]  Updated grand-daugther surgery  completed. JP drain in place. Empyema of the gallbladder. Zosyn. Unable to do cholangiogram due to inflammation. Will monitor Lfts. Clear diet and PRN pain meds.   Algis Greenhouse, MD [facility] [address] [place], [place] [address] [phone] (office)

## 2021-03-07 NOTE — Plan of Care (Signed)
  Problem: Education: Goal: Knowledge of General Education information will improve Description: Including pain rating scale, medication(s)/side effects and non-pharmacologic comfort measures Outcome: Progressing   Problem: Clinical Measurements: Goal: Ability to maintain clinical measurements within normal limits will improve Outcome: Progressing   Problem: Activity: Goal: Risk for activity intolerance will decrease Outcome: Progressing   Problem: Nutrition: Goal: Adequate nutrition will be maintained Outcome: Progressing   Problem: Elimination: Goal: Will not experience complications related to bowel motility Outcome: Progressing   Problem: Elimination: Goal: Will not experience complications related to urinary retention Outcome: Progressing   Problem: Pain Managment: Goal: General experience of comfort will improve Outcome: Progressing   Problem: Safety: Goal: Ability to remain free from injury will improve Outcome: Progressing   Problem: Skin Integrity: Goal: Risk for impaired skin integrity will decrease Outcome: Progressing

## 2021-03-07 NOTE — Transfer of Care (Signed)
Immediate Anesthesia Transfer of Care Note  Patient: Traci Chandler  Procedure(s) Performed: LAPAROSCOPIC CHOLECYSTECTOMY (Abdomen)  Patient Location: PACU  Anesthesia Type:General  Level of Consciousness: drowsy  Airway & Oxygen Therapy: Patient Spontanous Breathing  Post-op Assessment: Report given to RN and Post -op Vital signs reviewed and stable  Post vital signs: Reviewed and stable  Last Vitals:  Vitals Value Taken Time  BP    Temp    Pulse    Resp    SpO2      Last Pain:  Vitals:   03/07/21 1159  TempSrc: Oral  PainSc: 0-No pain         Complications: No notable events documented.

## 2021-03-07 NOTE — Anesthesia Postprocedure Evaluation (Signed)
Anesthesia Post Note  Patient: Traci Chandler  Procedure(s) Performed: LAPAROSCOPIC CHOLECYSTECTOMY (Abdomen)  Patient location during evaluation: PACU Anesthesia Type: General Level of consciousness: awake and alert Pain management: pain level controlled Vital Signs Assessment: post-procedure vital signs reviewed and stable Respiratory status: spontaneous breathing, nonlabored ventilation, respiratory function stable and patient connected to nasal cannula oxygen Cardiovascular status: blood pressure returned to baseline and stable Postop Assessment: no apparent nausea or vomiting Anesthetic complications: no   No notable events documented.   Last Vitals:  Vitals:   03/07/21 0431 03/07/21 1159  BP: 139/70 (!) 147/69  Pulse: 83 77  Resp: 19 18  Temp: 36.8 C (!) 38.7 C  SpO2: 96% 96%    Last Pain:  Vitals:   03/07/21 1159  TempSrc: Oral  PainSc: 0-No pain                 Windell Norfolk

## 2021-03-07 NOTE — Interval H&P Note (Signed)
History and Physical Interval Note:  03/07/2021 10:42 AM  Traci Chandler  has presented today for surgery, with the diagnosis of cholelithiasis and elevated liver test.  The various methods of treatment have been discussed with the patient and family. After consideration of risks, benefits and other options for treatment, the patient has consented to  Procedure(s): LAPAROSCOPIC CHOLECYSTECTOMY WITH INTRAOPERATIVE CHOLANGIOGRAM (N/A) as a surgical intervention.  The patient's history has been reviewed, patient examined, no change in status, stable for surgery.  I have reviewed the patient's chart and labs.  Questions were answered to the patient's satisfaction.    LFTs going up, will do cholangiogram, had fever overnight, on antibiotics pending any cholangitis type picture  [name]

## 2021-03-07 NOTE — Care Management Obs Status (Signed)
MEDICARE OBSERVATION STATUS NOTIFICATION   Patient Details  Name: Traci Chandler MRN: [id number] Date of Birth: 10-11-1955   Medicare Observation Status Notification Given:  Yes    Corey Harold 03/07/2021, 3:32 PM

## 2021-03-07 NOTE — Op Note (Addendum)
Operative Note   Preoperative Diagnosis: Symptomatic cholelithiasis, possible cholecystitis versus choledocholithiasis, elevated liver test   Postoperative Diagnosis: Empyema of the gallbladder    Procedure(s) Performed: Laparoscopic cholecystectomy   Surgeon: Leatrice Jewels. Henreitta Leber, MD   Assistants: No qualified resident was available   Anesthesia: General endotracheal   Anesthesiologist: Windell Norfolk, MD    Specimens: Gallbladder    Estimated Blood Loss: Minimal    Blood Replacement: None    Complications: None    Operative Findings: Distended gallbladder with stones, empyema of the gallbladder    Procedure: The patient was taken to the operating room and placed supine. General endotracheal anesthesia was induced. Intravenous antibiotics were  administered per protocol. An orogastric tube positioned to decompress the stomach. The abdomen was prepared and draped in the usual sterile fashion.    A supraumbilical incision was made and a Veress technique was utilized to achieve pneumoperitoneum to 15 mmHg with carbon dioxide. A 11 mm optiview port was placed through the supraumbilical region, and a 10 mm 0-degree operative laparoscope was introduced. The area underlying the trocar and Veress needle were inspected and without evidence of injury.  Remaining trocars were placed under direct vision. Two 5 mm ports were placed in the right abdomen, between the anterior axillary and midclavicular line.  A final 11 mm port was placed through the mid-epigastrium, near the falciform ligament.    The gallbladder distended and thickened with a rind and full of stones. It was very fragile and tore revealing purulence consistent with an empyema of the gallbladder. With careful dissection on the medial and lateral sides from the dome down, I was finally able to grab the fundus and get it elevated cephalad and the infundibulum was retracted to the patient's right. The gallbladder was long and full of  stones and the infundibulum was full of stones. Working with blunt dissection, I was finally able to get the infundibulum up and the lateral edge inferiorly freed.  The gallbladder/cystic duct junction was skeletonized. The cystic artery noted in the triangle of Calot and was also skeletonized.  We then continued liberal medial and lateral dissection until the critical view of safety was achieved.   I took photos of the cystic duct and artery with my suction behind at the hepatic plate. Given the extent of the inflammation I did not think I could achieve a cholangiogram without compromising the ability to close the duct, so I opted to abort the option of a cholangiogram. If she has issues we can obtain another MRCP or ERCP.         The cystic duct and cystic artery were triply clipped and divided. The gallbladder was then dissected from the liver bed with electrocautery. The specimen was placed in an Endopouch and was retrieved through the epigastric site. A second bag was used to get the excess stones that had fallen out during the procedure. Irrigation was performed. A JP drain was pulled through the lateral most port site and secured with a 3-0 Nylon pending any issues with the cystic duct leaking given the extent of inflammation.    Final inspection revealed acceptable hemostasis. Arista and Surgical SNOW was placed in the gallbladder bed.  Trocars were removed and pneumoperitoneum was released.  0 Vicryl fascial sutures were used to close the epigastric and umbilical port sites. Skin incisions were closed with staples and a sterile dressing.  Patient was awakened from anesthesia and extubated without complication.    Algis Greenhouse, MD [facility] [address] [place], [place] [address] [phone] (office)

## 2021-03-07 NOTE — Anesthesia Procedure Notes (Signed)
Procedure Name: Intubation Date/Time: 03/07/2021 1:38 PM Performed by: Riki Sheer, CRNA Pre-anesthesia Checklist: Patient identified, Emergency Drugs available, Suction available, Patient being monitored and Timeout performed Patient Re-evaluated:Patient Re-evaluated prior to induction Oxygen Delivery Method: Circle system utilized Preoxygenation: Pre-oxygenation with 100% oxygen Induction Type: IV induction Ventilation: Mask ventilation without difficulty Laryngoscope Size: Glidescope and 3 Grade View: Grade I Tube type: Oral Tube size: 7.0 mm Number of attempts: 2 Airway Equipment and Method: Video-laryngoscopy Placement Confirmation: ETT inserted through vocal cords under direct vision, positive ETCO2, CO2 detector and breath sounds checked- equal and bilateral Secured at: 22 cm Tube secured with: Tape Dental Injury: Teeth and Oropharynx as per pre-operative assessment  Difficulty Due To: Difficulty was unanticipated Comments: DL x1 with Mac 3, grade 2 view but unable to pass ETT. WHen trying to give cricoid, felt large goiter. Switched to Glidescope 3, Grade 1 view, still small opening through cords, ETT placed atraumatically. Recommend Glidescope for next intubation.

## 2021-03-07 NOTE — Anesthesia Preprocedure Evaluation (Addendum)
Anesthesia Evaluation  Patient identified by MRN, date of birth, ID band Patient awake    Reviewed: Allergy & Precautions, H&P , NPO status , Patient's Chart, lab work & pertinent test results, reviewed documented beta blocker date and time   Airway Mallampati: II  TM Distance: >3 FB Neck ROM: full    Dental no notable dental hx.    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  Echo below:  - Left ventricle: The cavity size was normal. Systolic function was  vigorous. The estimated ejection fraction was in the range of 65%  to 70%. Wall motion was normal; there were no regional wall  motion abnormalities. Findings consistent with left ventricular  diastolic dysfunction, grade indeterminate. Doppler parameters  are consistent with high ventricular filling pressure. Severe  asymmetric septal hypertrophy. Systolic anterior motion of the  anterior mitral leaflet is appreciated. No signficant resting  LVOT gradient.  - Aortic valve: There was mild regurgitation.  - Mitral valve: There was mild regurgitation.  Reproductive/Obstetrics negative OB ROS                            Anesthesia Physical Anesthesia Plan  ASA: 3  Anesthesia Plan: General and General ETT   Post-op Pain Management:    Induction:   PONV Risk Score and Plan: Ondansetron and Scopolamine patch - Pre-op  Airway  Management Planned:   Additional Equipment:   Intra-op Plan:   Post-operative Plan:   Informed Consent: I have reviewed the patients History and Physical, chart, labs and discussed the procedure including the risks, benefits and alternatives for the proposed anesthesia with the patient or authorized representative who has indicated his/her understanding and acceptance.     Dental Advisory Given  Plan Discussed with: CRNA  Anesthesia Plan Comments:        Anesthesia Quick Evaluation

## 2021-03-08 ENCOUNTER — Encounter (HOSPITAL_COMMUNITY): Payer: Self-pay | Admitting: General Surgery

## 2021-03-08 DIAGNOSIS — K8012 Calculus of gallbladder with acute and chronic cholecystitis without obstruction: Secondary | ICD-10-CM | POA: Diagnosis present

## 2021-03-08 DIAGNOSIS — K219 Gastro-esophageal reflux disease without esophagitis: Secondary | ICD-10-CM | POA: Diagnosis present

## 2021-03-08 DIAGNOSIS — Z888 Allergy status to other drugs, medicaments and biological substances status: Secondary | ICD-10-CM | POA: Diagnosis not present

## 2021-03-08 DIAGNOSIS — Z833 Family history of diabetes mellitus: Secondary | ICD-10-CM | POA: Diagnosis not present

## 2021-03-08 DIAGNOSIS — Z7982 Long term (current) use of aspirin: Secondary | ICD-10-CM | POA: Diagnosis not present

## 2021-03-08 DIAGNOSIS — Z20822 Contact with and (suspected) exposure to covid-19: Secondary | ICD-10-CM | POA: Diagnosis present

## 2021-03-08 DIAGNOSIS — B9620 Unspecified Escherichia coli [E. coli] as the cause of diseases classified elsewhere: Secondary | ICD-10-CM | POA: Diagnosis present

## 2021-03-08 DIAGNOSIS — Z885 Allergy status to narcotic agent status: Secondary | ICD-10-CM | POA: Diagnosis not present

## 2021-03-08 DIAGNOSIS — R7881 Bacteremia: Secondary | ICD-10-CM | POA: Diagnosis present

## 2021-03-08 DIAGNOSIS — Z8249 Family history of ischemic heart disease and other diseases of the circulatory system: Secondary | ICD-10-CM | POA: Diagnosis not present

## 2021-03-08 DIAGNOSIS — I10 Essential (primary) hypertension: Secondary | ICD-10-CM | POA: Diagnosis present

## 2021-03-08 DIAGNOSIS — K8050 Calculus of bile duct without cholangitis or cholecystitis without obstruction: Secondary | ICD-10-CM | POA: Diagnosis present

## 2021-03-08 LAB — CBC WITH DIFFERENTIAL/PLATELET
Abs Immature Granulocytes: 0.04 10*3/uL (ref 0.00–0.07)
Basophils Absolute: 0 10*3/uL (ref 0.0–0.1)
Basophils Relative: 0 %
Eosinophils Absolute: 0 10*3/uL (ref 0.0–0.5)
Eosinophils Relative: 0 %
HCT: 35.2 % — ABNORMAL LOW (ref 36.0–46.0)
Hemoglobin: 11.7 g/dL — ABNORMAL LOW (ref 12.0–15.0)
Immature Granulocytes: 1 %
Lymphocytes Relative: 8 %
Lymphs Abs: 0.6 10*3/uL — ABNORMAL LOW (ref 0.7–4.0)
MCH: 30.6 pg (ref 26.0–34.0)
MCHC: 33.2 g/dL (ref 30.0–36.0)
MCV: 92.1 fL (ref 80.0–100.0)
Monocytes Absolute: 0.7 10*3/uL (ref 0.1–1.0)
Monocytes Relative: 10 %
Neutro Abs: 6 10*3/uL (ref 1.7–7.7)
Neutrophils Relative %: 81 %
Platelets: 125 10*3/uL — ABNORMAL LOW (ref 150–400)
RBC: 3.82 MIL/uL — ABNORMAL LOW (ref 3.87–5.11)
RDW: 12.5 % (ref 11.5–15.5)
WBC: 7.4 10*3/uL (ref 4.0–10.5)
nRBC: 0 % (ref 0.0–0.2)

## 2021-03-08 LAB — HEPATIC FUNCTION PANEL
ALT: 254 U/L — ABNORMAL HIGH (ref 0–44)
AST: 216 U/L — ABNORMAL HIGH (ref 15–41)
Albumin: 3 g/dL — ABNORMAL LOW (ref 3.5–5.0)
Alkaline Phosphatase: 77 U/L (ref 38–126)
Bilirubin, Direct: 1.3 mg/dL — ABNORMAL HIGH (ref 0.0–0.2)
Indirect Bilirubin: 1 mg/dL — ABNORMAL HIGH (ref 0.3–0.9)
Total Bilirubin: 2.3 mg/dL — ABNORMAL HIGH (ref 0.3–1.2)
Total Protein: 6.8 g/dL (ref 6.5–8.1)

## 2021-03-08 LAB — BASIC METABOLIC PANEL
Anion gap: 9 (ref 5–15)
BUN: 19 mg/dL (ref 8–23)
CO2: 26 mmol/L (ref 22–32)
Calcium: 8.8 mg/dL — ABNORMAL LOW (ref 8.9–10.3)
Chloride: 98 mmol/L (ref 98–111)
Creatinine, Ser: 1.45 mg/dL — ABNORMAL HIGH (ref 0.44–1.00)
GFR, Estimated: 40 mL/min — ABNORMAL LOW (ref >60)
Glucose, Bld: 123 mg/dL — ABNORMAL HIGH (ref 70–99)
Potassium: 4.1 mmol/L (ref 3.5–5.1)
Sodium: 133 mmol/L — ABNORMAL LOW (ref 135–145)

## 2021-03-08 NOTE — Progress Notes (Signed)
[facility] Progress Note  1 Day Post-Op  Subjective: Pain improved now just sore. Had temp again yesterday. Bacteremia GNR noted. On zosyn. JP with dark old blood < 20cc in the bulb.   Objective: Vital signs in last 24 hours: Temp:  [98.2 F (36.8 C)-101.7 F (38.7 C)] 98.8 F (37.1 C) (09/21 0514) Pulse Rate:  [74-89] 74 (09/21 0514) Resp:  [16-20] 20 (09/20 1746) BP: (137-151)/(56-71) 138/69 (09/21 0514) SpO2:  [96 %-100 %] 97 % (09/21 0514) Last BM Date: 03/08/21  Intake/Output from previous day: 09/20 0701 - 09/21 0700 In: 1626.1 [P.O.:120; I.V.:1478.3; IV Piggyback:27.7] Out: 200 [Blood:200] Intake/Output this shift: Total I/O In: 240 [P.O.:240] Out: -   General appearance: alert, cooperative, and no distress Resp: normal work of breathing GI: soft, nondistended, tender, dressing in place over staples, JP with dark sanginous material/ slightly brown likely from the arista/ snow   Lab Results:  Recent Labs    03/06/21 0331 03/08/21 0532  WBC 7.6 7.4  HGB 14.2 11.7*  HCT 43.2 35.2*  PLT 161 125*   BMET Recent Labs    03/07/21 0534 03/08/21 0532  NA 134* 133*  K 3.8 4.1  CL 103 98  CO2 24 26  GLUCOSE 102* 123*  BUN 19 19  CREATININE 1.35* 1.45*  CALCIUM 9.1 8.8*   PT/INR No results for input(s): LABPROT, INR in the last 72 hours.  Studies/Results: No results found.  Anti-infectives: Anti-infectives (From admission, onward)    Start     Dose/Rate Route Frequency Ordered Stop   03/07/21 1200  cefoTEtan (CEFOTAN) 2 g in sodium chloride 0.9 % 100 mL IVPB        2 g 200 mL/hr over 30 Minutes Intravenous On call to O.R. 03/06/21 1456 03/07/21 1345   03/07/21 1150  sodium chloride 0.9 % with cefoTEtan (CEFOTAN) ADS Med       Note to Pharmacy: Waynard Edwards   : cabinet override      03/07/21 1150 03/07/21 1351   03/06/21 1845  piperacillin-tazobactam (ZOSYN) IVPB 3.375 g        3.375 g 12.5 mL/hr over 240 Minutes Intravenous  Every 8 hours 03/06/21 1817         Assessment/Plan: Traci Chandler is a 66 yo s/p laparoscopic cholecystectomy for empyema of the gallbladder and bacteremia with GNR. Discussed with ID on Epic chat and recommended awaiting cultures and continuing IV antibiotic for at least 2 days after cultures.  Hopefully can go home on Augmentin.  PRN for pain IS, OOB Soft diet Labs in the AM, Bilirubin down, JP with Sanguinous output, monitor for bile given inflammation and risk of lea  Zosyn for empyema of th gallbladder and bacteremia with GNR  SCDs holding heparin for now given drifting H&H    LOS: 0 days    Traci Chandler 03/08/2021

## 2021-03-09 ENCOUNTER — Other Ambulatory Visit: Payer: Self-pay | Admitting: Family Medicine

## 2021-03-09 ENCOUNTER — Ambulatory Visit: Payer: Medicare Other | Admitting: General Surgery

## 2021-03-09 DIAGNOSIS — R531 Weakness: Secondary | ICD-10-CM

## 2021-03-09 DIAGNOSIS — R1010 Upper abdominal pain, unspecified: Secondary | ICD-10-CM

## 2021-03-09 DIAGNOSIS — K8020 Calculus of gallbladder without cholecystitis without obstruction: Secondary | ICD-10-CM

## 2021-03-09 LAB — CBC WITH DIFFERENTIAL/PLATELET
Abs Immature Granulocytes: 0.02 10*3/uL (ref 0.00–0.07)
Basophils Absolute: 0 10*3/uL (ref 0.0–0.1)
Basophils Relative: 0 %
Eosinophils Absolute: 0.1 10*3/uL (ref 0.0–0.5)
Eosinophils Relative: 1 %
HCT: 33.6 % — ABNORMAL LOW (ref 36.0–46.0)
Hemoglobin: 11 g/dL — ABNORMAL LOW (ref 12.0–15.0)
Immature Granulocytes: 0 %
Lymphocytes Relative: 21 %
Lymphs Abs: 1.1 10*3/uL (ref 0.7–4.0)
MCH: 30.6 pg (ref 26.0–34.0)
MCHC: 32.7 g/dL (ref 30.0–36.0)
MCV: 93.6 fL (ref 80.0–100.0)
Monocytes Absolute: 0.6 10*3/uL (ref 0.1–1.0)
Monocytes Relative: 11 %
Neutro Abs: 3.4 10*3/uL (ref 1.7–7.7)
Neutrophils Relative %: 67 %
Platelets: 124 10*3/uL — ABNORMAL LOW (ref 150–400)
RBC: 3.59 MIL/uL — ABNORMAL LOW (ref 3.87–5.11)
RDW: 12.6 % (ref 11.5–15.5)
WBC: 5.2 10*3/uL (ref 4.0–10.5)
nRBC: 0 % (ref 0.0–0.2)

## 2021-03-09 LAB — CULTURE, BLOOD (ROUTINE X 2)

## 2021-03-09 LAB — HEPATIC FUNCTION PANEL
ALT: 161 U/L — ABNORMAL HIGH (ref 0–44)
AST: 68 U/L — ABNORMAL HIGH (ref 15–41)
Albumin: 2.9 g/dL — ABNORMAL LOW (ref 3.5–5.0)
Alkaline Phosphatase: 65 U/L (ref 38–126)
Bilirubin, Direct: 0.7 mg/dL — ABNORMAL HIGH (ref 0.0–0.2)
Indirect Bilirubin: 1 mg/dL — ABNORMAL HIGH (ref 0.3–0.9)
Total Bilirubin: 1.7 mg/dL — ABNORMAL HIGH (ref 0.3–1.2)
Total Protein: 6.5 g/dL (ref 6.5–8.1)

## 2021-03-09 LAB — BASIC METABOLIC PANEL
Anion gap: 8 (ref 5–15)
BUN: 25 mg/dL — ABNORMAL HIGH (ref 8–23)
CO2: 27 mmol/L (ref 22–32)
Calcium: 8.7 mg/dL — ABNORMAL LOW (ref 8.9–10.3)
Chloride: 99 mmol/L (ref 98–111)
Creatinine, Ser: 1.53 mg/dL — ABNORMAL HIGH (ref 0.44–1.00)
GFR, Estimated: 37 mL/min — ABNORMAL LOW (ref >60)
Glucose, Bld: 89 mg/dL (ref 70–99)
Potassium: 3.9 mmol/L (ref 3.5–5.1)
Sodium: 134 mmol/L — ABNORMAL LOW (ref 135–145)

## 2021-03-09 LAB — SURGICAL PATHOLOGY

## 2021-03-09 MED ORDER — CEFDINIR 300 MG PO CAPS: 300.0000 mg | ORAL_CAPSULE | Freq: Two times a day (BID) | ORAL | 0 refills | Status: AC

## 2021-03-09 MED ORDER — OXYCODONE HCL 5 MG PO TABS: 5.0000 mg | ORAL_TABLET | ORAL | 0 refills | Status: DC | PRN

## 2021-03-09 MED ORDER — METRONIDAZOLE 500 MG PO TABS: 500.0000 mg | ORAL_TABLET | Freq: Two times a day (BID) | ORAL | 0 refills | Status: AC

## 2021-03-09 MED ORDER — ONDANSETRON 4 MG PO TBDP: 4.0000 mg | ORAL_TABLET | Freq: Four times a day (QID) | ORAL | 0 refills | Status: DC | PRN

## 2021-03-09 MED ORDER — DOCUSATE SODIUM 100 MG PO CAPS: 100.0000 mg | ORAL_CAPSULE | Freq: Two times a day (BID) | ORAL | 0 refills | Status: DC | PRN

## 2021-03-09 NOTE — Discharge Summary (Signed)
Physician Discharge Summary  Patient ID: Traci Chandler MRN: [id number] DOB/AGE: 09/29/1955 65 y.o.  Admit date: 03/06/2021 Discharge date: 03/09/2021  Admission Diagnoses: Cholecystitis, elevated Liver test   Discharge Diagnoses:  Principal Problem:   Empyema of gallbladder Active Problems:   Biliary colic   Discharged Condition: Good  Hospital Course: Traci Chandler is a very sweet 65 yo who came in with worsening gallbladder pain and worsening LFTs. She had bene seen the prior week but imaging and labs were reassuring and she wanted to schedule an outpatient procedure. She returned and her LFTs were elevated and her pain continued. She was taken to the OR for laparoscopic cholecystectomy. She also was found to have bacteremia with E coli during this admission when she developed fevers to 102 and blood cultures were done. She had a very inflamed gallbladder and a drain was left in place to ensure no bile leak from the cystic duct stump.   Post operatively she has done well. Her drain is draining old blood and is < 20cc in the bulb today. She is eating and having adequate pain control. She wants to go home. I discussed the case with ID and they recommended Cefdinir 300 BID and Flagyl 500 BID for 5 more days for the bacteremia and empyema of the gallbladder.   She was taught drain care. Plan for repeat labs on Monday and hopefully drain removal on Tuesday. The patient knows what to look for with changes in drain output. Her LFTs were trending down and she was feeling better.   Consults:  ID -epic chat   Significant Diagnostic Studies:  Results for Traci Chandler (MRN [id number]) as of 03/09/2021 10:08  Ref. Range 03/09/2021 05:46  Sodium Latest Ref Range: 135 - 145 mmol/L 134 (L)  Potassium Latest Ref Range: 3.5 - 5.1 mmol/L 3.9  Chloride Latest Ref Range: 98 - 111 mmol/L 99  CO2 Latest Ref Range: 22 - 32 mmol/L 27  Glucose Latest Ref Range: 70 - 99 mg/dL 89  BUN Latest Ref Range: 8 - 23  mg/dL 25 (H)  Creatinine Latest Ref Range: 0.44 - 1.00 mg/dL 6.29 (H)  Calcium Latest Ref Range: 8.9 - 10.3 mg/dL 8.7 (L)  Anion gap Latest Ref Range: 5 - 15  8  Alkaline Phosphatase Latest Ref Range: 38 - 126 U/L 65  Albumin Latest Ref Range: 3.5 - 5.0 g/dL 2.9 (L)  AST Latest Ref Range: 15 - 41 U/L 68 (H)  ALT Latest Ref Range: 0 - 44 U/L 161 (H)  Total Protein Latest Ref Range: 6.5 - 8.1 g/dL 6.5  Bilirubin, Direct Latest Ref Range: 0.0 - 0.2 mg/dL 0.7 (H)  Indirect Bilirubin Latest Ref Range: 0.3 - 0.9 mg/dL 1.0 (H)  Total Bilirubin Latest Ref Range: 0.3 - 1.2 mg/dL 1.7 (H)  GFR, Estimated Latest Ref Range: >60 mL/min 37 (L)  WBC Latest Ref Range: 4.0 - 10.5 K/uL 5.2  RBC Latest Ref Range: 3.87 - 5.11 MIL/uL 3.59 (L)  Hemoglobin Latest Ref Range: 12.0 - 15.0 g/dL 52.8 (L)  HCT Latest Ref Range: 36.0 - 46.0 % 33.6 (L)  MCV Latest Ref Range: 80.0 - 100.0 fL 93.6  MCH Latest Ref Range: 26.0 - 34.0 pg 30.6  MCHC Latest Ref Range: 30.0 - 36.0 g/dL 41.3  RDW Latest Ref Range: 11.5 - 15.5 % 12.6  Platelets Latest Ref Range: 150 - 400 K/uL 124 (L)  nRBC Latest Ref Range: 0.0 - 0.2 % 0.0  Neutrophils Latest Units: %  67  Lymphocytes Latest Units: % 21  Monocytes Relative Latest Units: % 11  Eosinophil Latest Units: % 1  Basophil Latest Units: % 0  Immature Granulocytes Latest Units: % 0  NEUT# Latest Ref Range: 1.7 - 7.7 K/uL 3.4  Lymphocyte # Latest Ref Range: 0.7 - 4.0 K/uL 1.1  Monocyte # Latest Ref Range: 0.1 - 1.0 K/uL 0.6  Eosinophils Absolute Latest Ref Range: 0.0 - 0.5 K/uL 0.1  Basophils Absolute Latest Ref Range: 0.0 - 0.1 K/uL 0.0  Abs Immature Granulocytes Latest Ref Range: 0.00 - 0.07 K/uL 0.02   Treatments: IV hydration, antibiotics: Zosyn, and surgery: laparoscopic cholecystectomy with JP Drain placement 03/07/2021   Discharge Exam: Blood pressure (!) 128/57, pulse 74, temperature 98.3 F (36.8 C), temperature source Oral, resp. rate 18, height 5\' 6"  (1.676 m), weight  109.8 kg, SpO2 96 %. General appearance: alert, cooperative, and no distress Resp: normal work of breathing GI: soft, nondistended, appropriately tender, JP with dark brownish/ red fluid   Disposition: Discharge disposition: 01-Home or Self Care      Discharge Instructions     Call MD for:  difficulty breathing, headache or visual disturbances   Complete by: As directed    Call MD for:  extreme fatigue   Complete by: As directed    Call MD for:  persistant dizziness or light-headedness   Complete by: As directed    Call MD for:  persistant nausea and vomiting   Complete by: As directed    Call MD for:  redness, tenderness, or signs of infection (pain, swelling, redness, odor or green/yellow discharge around incision site)   Complete by: As directed    Call MD for:  severe uncontrolled pain   Complete by: As directed    Call MD for:  temperature >100.4   Complete by: As directed    Increase activity slowly   Complete by: As directed       Allergies as of 03/09/2021       Reactions   Codeine Nausea And Vomiting   Diclofenac Other (See Comments)   Lisinopril Other (See Comments)        Medication List     TAKE these medications    aspirin 81 MG tablet Take 81 mg by mouth every evening.   atenolol 50 MG tablet Commonly known as: TENORMIN Take 1 tablet by mouth daily.   cefdinir 300 MG capsule Commonly known as: OMNICEF Take 1 capsule (300 mg total) by mouth 2 (two) times daily for 5 days.   docusate sodium 100 MG capsule Commonly known as: COLACE Take 1 capsule (100 mg total) by mouth 2 (two) times daily as needed for mild constipation. What changed: when to take this   metroNIDAZOLE 500 MG tablet Commonly known as: Flagyl Take 1 tablet (500 mg total) by mouth 2 (two) times daily for 5 days.   NIFEdipine 60 MG 24 hr tablet Commonly known as: PROCARDIA XL/NIFEDICAL XL Take 60 mg by mouth daily.   omeprazole 20 MG capsule Commonly known as:  PRILOSEC TAKE 1 CAPSULE 2 TIMES A DAY BEFORE A MEAL. What changed: See the new instructions.   ondansetron 4 MG disintegrating tablet Commonly known as: ZOFRAN-ODT Take 1 tablet (4 mg total) by mouth every 6 (six) hours as needed for nausea.   oxyCODONE 5 MG immediate release tablet Commonly known as: Oxy IR/ROXICODONE Take 1-2 tablets (5-10 mg total) by mouth every 4 (four) hours as needed for severe pain or breakthrough pain.  polyethylene glycol 17 g packet Commonly known as: MIRALAX / GLYCOLAX Take 17 g by mouth daily.   spironolactone 25 MG tablet Commonly known as: ALDACTONE Take 25 mg by mouth 2 (two) times daily.   tiZANidine 2 MG tablet Commonly known as: ZANAFLEX Take 1-2 tablets (2-4 mg total) by mouth every 8 (eight) hours as needed for muscle spasms.       [facility] for Labs on Monday. Orders in place.   Future Appointments  Date Time Provider Department Center  03/14/2021 10:15 AM [name], MD [facility] None      Signed: [name] 03/09/2021, 10:15 AM

## 2021-03-09 NOTE — Discharge Instructions (Signed)
Discharge Laparoscopic Surgery Instructions:  Take your antibiotics as prescribed. Go get labs at [facility] on Monday ([address], [place], [place] [address]; hours 7am-12pm and 1-4pm)  Take care of your drain as indicated. If the color turns Green or you see any concerning changes call and let Dr. Henreitta Leber know.   Common Complaints: Right shoulder pain is common after laparoscopic surgery. This is secondary to the gas used in the surgery being trapped under the diaphragm.  Walk to help your body absorb the gas. This will improve in a few days. Pain at the port sites are common, especially the larger port sites. This will improve with time.  Some nausea is common and poor appetite. The main goal is to stay hydrated the first few days after surgery.   Diet/ Activity: Diet as tolerated. You may not have an appetite, but it is important to stay hydrated. Drink 64 ounces of water a day. Your appetite will return with time.  Shower per your regular routine daily.  Do not take hot showers. Take warm showers that are less than 10 minutes. Rest and listen to your body, but do not remain in bed all day.  Walk everyday for at least 15-20 minutes. Deep cough and move around every 1-2 hours in the first few days after surgery.  Do not lift > 10 lbs, perform excessive bending, pushing, pulling, squatting for 1-2 weeks after surgery.  Do not pick at the staples, you can remove the bandages.  Do not place lotions or balms on your incision unless instructed to specifically by Dr. Henreitta Leber.   JP Drain Care Please keep the drain clean and dry. Replace the gauze/ tape around the drain if it gets dirty or wet/ saturated. Please do not mess with or cut the stitch that is keeping the drain in place. Secure the drain to your clothes so that it does not get dislodged.  You may want to wear a binder around your abdomen (girdle) at night for sleeping if you are worried about it getting pulled out.  Please record  the output from the drain daily including the color and the amount in milliliters.  Please keep the drain covered with plastic and tape when you shower so that it does not get wet.   Pain Expectations and Narcotics: -After surgery you will have pain associated with your incisions and this is normal. The pain is muscular and nerve pain, and will get better with time. -You are encouraged and expected to take non narcotic medications like tylenol and ibuprofen (when able) to treat pain as multiple modalities can aid with pain treatment. -Narcotics are only used when pain is severe or there is breakthrough pain. -You are not expected to have a pain score of 0 after surgery, as we cannot prevent pain. A pain score of 3-4 that allows you to be functional, move, walk, and tolerate some activity is the goal. The pain will continue to improve over the days after surgery and is dependent on your surgery. -Due to [place] law, we are only able to give a certain amount of pain medication to treat post operative pain, and we only give additional narcotics on a patient by patient basis.  -For most laparoscopic surgery, studies have shown that the majority of patients only need 10-15 narcotic pills, and for open surgeries most patients only need 15-20.   -Having appropriate expectations of pain and knowledge of pain management with non narcotics is important as we  do not want anyone to become addicted to narcotic pain medication.  -Using ice packs in the first 48 hours and heating pads after 48 hours, wearing an abdominal binder (when recommended), and using over the counter medications are all ways to help with pain management.   -Simple acts like meditation and mindfulness practices after surgery can also help with pain control and research has proven the benefit of these practices.  Medication: Take tylenol and ibuprofen as needed for pain control, alternating every 4-6 hours.  Example:  Tylenol 1000mg  @ 6am, 12noon,  6pm, (Do not exceed 4000mg  of tylenol a day). Ibuprofen 800mg  @ 9am, 3pm, 9pm, 3am (Do not exceed 3600mg  of ibuprofen a day).  Take Roxicodone for breakthrough pain every 4 hours.  Take Colace for constipation related to narcotic pain medication. If you do not have a bowel movement in 2 days, take Miralax over the counter.  Drink plenty of water to also prevent constipation.   Contact Information: If you have questions or concerns, please call our office, [phone], Monday- Thursday 8AM-5PM and Friday 8AM-12Noon.  If it is after hours or on the weekend, please call [facility], [phone], [phone], and ask to speak to the surgeon on call for Dr. Thursday at [facility].

## 2021-03-14 ENCOUNTER — Other Ambulatory Visit: Payer: Self-pay

## 2021-03-14 ENCOUNTER — Ambulatory Visit (INDEPENDENT_AMBULATORY_CARE_PROVIDER_SITE_OTHER): Payer: Medicare Other | Admitting: General Surgery

## 2021-03-14 ENCOUNTER — Encounter: Payer: Self-pay | Admitting: General Surgery

## 2021-03-14 VITALS — BP 122/69 | HR 65 | Temp 98.5°F | Resp 14 | Ht 66.0 in | Wt 236.0 lb

## 2021-03-14 DIAGNOSIS — R1010 Upper abdominal pain, unspecified: Secondary | ICD-10-CM | POA: Diagnosis not present

## 2021-03-14 DIAGNOSIS — K8020 Calculus of gallbladder without cholecystitis without obstruction: Secondary | ICD-10-CM | POA: Diagnosis not present

## 2021-03-14 DIAGNOSIS — R531 Weakness: Secondary | ICD-10-CM | POA: Diagnosis not present

## 2021-03-14 DIAGNOSIS — K810 Acute cholecystitis: Secondary | ICD-10-CM

## 2021-03-14 NOTE — Patient Instructions (Signed)
Steristrips will peel up after 5-7 days, ok to remove after that. Ok to shower. Drain site bandage can be removed in 48 hours, replace with bandage as needed.  Call with questions or concerns. Diet and activity as tolerated. Go get labs and we will you with the results.

## 2021-03-15 LAB — CBC WITH DIFFERENTIAL/PLATELET
Absolute Monocytes: 528 cells/uL (ref 200–950)
Basophils Absolute: 24 cells/uL (ref 0–200)
Basophils Relative: 0.3 %
Eosinophils Absolute: 120 cells/uL (ref 15–500)
Eosinophils Relative: 1.5 %
HCT: 34.3 % — ABNORMAL LOW (ref 35.0–45.0)
Hemoglobin: 11.2 g/dL — ABNORMAL LOW (ref 11.7–15.5)
Lymphs Abs: 1888 cells/uL (ref 850–3900)
MCH: 29.6 pg (ref 27.0–33.0)
MCHC: 32.7 g/dL (ref 32.0–36.0)
MCV: 90.7 fL (ref 80.0–100.0)
MPV: 10.3 fL (ref 7.5–12.5)
Monocytes Relative: 6.6 %
Neutro Abs: 5440 cells/uL (ref 1500–7800)
Neutrophils Relative %: 68 %
Platelets: 225 10*3/uL (ref 140–400)
RBC: 3.78 10*6/uL — ABNORMAL LOW (ref 3.80–5.10)
RDW: 12.5 % (ref 11.0–15.0)
Total Lymphocyte: 23.6 %
WBC: 8 10*3/uL (ref 3.8–10.8)

## 2021-03-15 LAB — COMPREHENSIVE METABOLIC PANEL
AG Ratio: 1.2 (calc) (ref 1.0–2.5)
ALT: 43 U/L — ABNORMAL HIGH (ref 6–29)
AST: 19 U/L (ref 10–35)
Albumin: 3.4 g/dL — ABNORMAL LOW (ref 3.6–5.1)
Alkaline phosphatase (APISO): 66 U/L (ref 37–153)
BUN/Creatinine Ratio: 13 (calc) (ref 6–22)
BUN: 16 mg/dL (ref 7–25)
CO2: 27 mmol/L (ref 20–32)
Calcium: 9.3 mg/dL (ref 8.6–10.4)
Chloride: 102 mmol/L (ref 98–110)
Creat: 1.25 mg/dL — ABNORMAL HIGH (ref 0.50–1.05)
Globulin: 2.9 g/dL (calc) (ref 1.9–3.7)
Glucose, Bld: 105 mg/dL (ref 65–139)
Potassium: 4.2 mmol/L (ref 3.5–5.3)
Sodium: 137 mmol/L (ref 135–146)
Total Bilirubin: 0.8 mg/dL (ref 0.2–1.2)
Total Protein: 6.3 g/dL (ref 6.1–8.1)

## 2021-03-15 NOTE — Progress Notes (Signed)
[facility]  Doing well post operatively. < 20cc from the drain daily and remains bloody serosanginous. No signs of bile. No pain. Just sore. Completing antibiotics.   BP 122/69   Pulse 65   Temp 98.5 F (36.9 C) (Other (Comment))   Resp 14   Ht 5\' 6"  (1.676 m)   Wt 236 lb (107 kg)   SpO2 96%   BMI 38.09 kg/m  Staples removed, steri strips placed, no erythema or drainage JP drain removed and bandage placed.  Patient s/p laparoscopic cholecystectomy for empyema of the gallbladder.   Steristrips will peel up after 5-7 days, ok to remove after that. Ok to shower. Drain site bandage can be removed in 48 hours, replace with bandage as needed.  Call with questions or concerns. Diet and activity as tolerated. Go get labs and we will you with the results. PRN Follow up  , MD [facility] [address] [place], [place] [phone] (office)

## 2021-03-20 NOTE — Discharge Summary (Signed)
Physician Discharge Summary  Traci Chandler WCB:[id number] DOB: 1956/05/04 DOA: 03/02/2021  PCP: Irena Reichmann, DO  Admit date: 03/02/2021 Discharge date: 03/03/2021  Time spent:  Recommendations for Outpatient Follow-up:  Follow-up with Dr. Henreitta Leber next week for lap chole  Discharge Diagnoses:  Active Problems:   Biliary colic Hypertrophic cardiomyopathy Hypertension CKD 2  Discharge Condition: Stable  Diet recommendation: Low-fat, soft diet  Filed Weights   03/02/21 1146  Weight: 108.9 kg    History of present illness:  Traci Chandler is a 65 y.o. female with RUQ pain and associated nausea and vomiting that came on suddenly and has never had this prior. LFTs were all number but CT demonstrated concern for dilated CBD and pancreatic duct. MRCP was done to ensure no stones or narrowing. The MRCP demonstrated no masses or stricture and a normal pancreatic duct and dilated CBD without stones. She is feeling much better and having no pain or nausea/ vomiting. 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    She has a history of hypertrophic cardiomyopathy but no chest pain or SOB. No leg swelling. Her ECHO have been stable. She follows with cardiology  Hospital Course:   Biliary colic Cholelithiasis -Patient was admitted with right upper quadrant pain, 1 episode of nausea and vomiting, LFTs were normal,, CT abdomen raised concern for dilated CBD, MRCP was reassuring noted normal appearance of pancreatic duct, mildly dilated CBD without stones, patient remained stable thereafter. 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 Henreitta Leber in consultation who was okay with discharge home today, she set her up next week to be scheduled for elective lap chole, she  tolerated diet prior to discharge.  History of hypertrophic cardiomyopathy -This remained stable, no acute findings at this time  CKD stage II -Creatinine stable at baseline  Consultations: General surgery Dr. Henreitta Leber  Discharge Exam: Vitals:   03/03/21 0922 03/03/21 1219  BP: 132/71 128/74  Pulse: 69 70  Resp: 20 18  Temp:  98 F (36.7 C)  SpO2: 97% 98%    General: AAOx3 Cardiovascular: S1S2/RRR Respiratory: CTAB  Discharge Instructions   Discharge Instructions     Discharge instructions   Complete by: As directed    Low fat diet      Allergies as of 03/03/2021       Reactions   Codeine Nausea And Vomiting   Diclofenac    Lisinopril Other (See Comments)        Medication List     STOP taking these medications    meloxicam 15 MG tablet Commonly known as: Mobic       TAKE these medications    aspirin 81 MG tablet Take 81 mg by mouth every evening.   atenolol 50 MG tablet Commonly known as: TENORMIN Take 1 tablet by mouth daily.   NIFEdipine 60 MG 24 hr tablet Commonly known as: PROCARDIA XL/NIFEDICAL XL Take 60 mg by mouth daily.   omeprazole 20 MG capsule Commonly known as: PRILOSEC TAKE 1 CAPSULE 2 TIMES A DAY BEFORE A MEAL. What changed: See the new instructions.   polyethylene glycol 17 g packet Commonly known as: MIRALAX / GLYCOLAX Take 17 g by mouth daily.   spironolactone 25 MG tablet Commonly known as: ALDACTONE  Take 25 mg by mouth 2 (two) times daily.   tiZANidine 2 MG tablet Commonly known as: ZANAFLEX Take 1-2 tablets (2-4 mg total) by mouth every 8 (eight) hours as needed for muscle spasms.       Allergies  Allergen Reactions   Codeine Nausea And Vomiting   Diclofenac Other (See Comments)   Lisinopril Other (See Comments)    Follow-up Information     [name], MD Follow up on 03/09/2021.   Specialty: General Surgery Why: at 3:15pm Contact information: [address] [place] [facility]  [address] [phone]                  The results of significant diagnostics from this hospitalization (including imaging, microbiology, ancillary and laboratory) are listed below for reference.    Significant Diagnostic Studies: CT ABDOMEN PELVIS W CONTRAST  Result Date: 03/02/2021 CLINICAL DATA:  Abdominal pain EXAM: CT ABDOMEN AND PELVIS WITH CONTRAST TECHNIQUE: Multidetector CT imaging of the abdomen and pelvis was performed using the standard protocol following bolus administration of intravenous contrast. CONTRAST:  OMNIPAQUE IOHEXOL 300 MG/ML  SOLN COMPARISON:  None. FINDINGS: Lower chest: No acute abnormality. Hepatobiliary: No focal liver lesions. Distended gallbladder containing high density material which is likely a combination of stones and sludge. Dilated intrahepatic bile ducts and common bile duct Pancreas: Unremarkable. Main pancreatic duct at the level of the pancreatic head, measuring up to 7 mm. No surrounding inflammatory changes. Spleen: Normal in size without focal abnormality. Adrenals/Urinary Tract: Kidneys enhance symmetrically with no evidence of hydronephrosis or nephrolithiasis. Bilateral low-attenuation renal lesions, largest are compatible with simple cysts, others are too small to completely characterize. Bladder is unremarkable. 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 Stomach/Bowel: Small hiatal hernia. Normal appendix. Small duodenal diverticulum. 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 No evidence of bowel wall thickening, obstruction, or inflammatory change. Vascular/Lymphatic: Aortic atherosclerosis. No enlarged abdominal or pelvic lymph nodes. Reproductive: Status post hysterectomy. No adnexal masses. Other: No abdominal wall hernia or abnormality. No abdominopelvic ascites. Musculoskeletal: No acute or significant osseous findings. IMPRESSION: Dilated intrahepatic and  extrahepatic bile ducts, and mild dilation of the pancreatic duct at the pancreatic head. Recommend further evaluation with MRCP with and without contrast to exclude underlying mass or stone. Distended gallbladder containing high density material which is likely a combination of stones and sludge. This could be further evaluated with gallbladder ultrasound or MRI is as recommended above if there is clinical concern for acute cholecystitis. 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 Aortic Atherosclerosis (ICD10-I70.0). Electronically Signed   By: Allegra Lai M.D.   On: 03/02/2021 15:18   MR 3D Recon At Scanner  Result Date: 03/02/2021 CLINICAL DATA:  Right upper quadrant pain. Biliary ductal dilatation on recent CT. EXAM: MRI ABDOMEN WITHOUT AND WITH CONTRAST (INCLUDING MRCP) TECHNIQUE: Multiplanar multisequence MR imaging of the abdomen was performed both before and after the administration of intravenous contrast. Heavily T2-weighted images of the biliary and pancreatic ducts were obtained, and three-dimensional MRCP images were rendered by post processing. CONTRAST:  25mL GADAVIST GADOBUTROL 1 MMOL/ML IV SOLN COMPARISON:  CT on 03/02/2021 FINDINGS: Lower chest: No acute findings. Hepatobiliary: Image degradation by motion artifact noted. No hepatic masses identified. Numerous tiny less than 1 cm gallstones are seen filling the gallbladder. No evidence of gallbladder wall thickening  or pericholecystic inflammatory changes. Mild diffuse biliary ductal dilatation is seen with common bile duct measuring 11 mm in diameter. However, there is no evidence of choledocholithiasis or biliary stricture. Pancreas: No evidence of pancreatic mass or inflammatory changes. Although limited by motion artifact, there is no evidence of pancreatic ductal dilatation. Spleen:  Within normal limits in size and appearance. Adrenals/Urinary Tract: Several  small benign-appearing renal cysts are seen bilaterally. No masses identified. No evidence of hydronephrosis. Stomach/Bowel: Tiny hiatal hernia noted.  Otherwise unremarkable. Vascular/Lymphatic: No pathologically enlarged lymph nodes identified. No acute vascular findings. Other:  None. Musculoskeletal:  No suspicious bone lesions identified. IMPRESSION: Image degradation by motion artifact noted. Cholelithiasis. No radiographic evidence of acute cholecystitis. Mild diffuse biliary ductal dilatation. No evidence of choledocholithiasis, pancreatic mass, or other obstructing etiology apparent. Tiny hiatal hernia. Electronically Signed   By: Danae Orleans M.D.   On: 03/02/2021 17:44   MR ABDOMEN MRCP W WO CONTAST  Result Date: 03/02/2021 CLINICAL DATA:  Right upper quadrant pain. Biliary ductal dilatation on recent CT. EXAM: MRI ABDOMEN WITHOUT AND WITH CONTRAST (INCLUDING MRCP) TECHNIQUE: Multiplanar multisequence MR imaging of the abdomen was performed both before and after the administration of intravenous contrast. Heavily T2-weighted images of the biliary and pancreatic ducts were obtained, and three-dimensional MRCP images were rendered by post processing. CONTRAST:  59mL GADAVIST GADOBUTROL 1 MMOL/ML IV SOLN COMPARISON:  CT on 03/02/2021 FINDINGS: Lower chest: No acute findings. Hepatobiliary: Image degradation by motion artifact noted. No hepatic masses identified. Numerous tiny less than 1 cm gallstones are seen filling the gallbladder. No evidence of gallbladder wall thickening or pericholecystic inflammatory changes. Mild diffuse biliary ductal dilatation is seen with common bile duct measuring 11 mm in diameter. However, there is no evidence of choledocholithiasis or biliary stricture. Pancreas: No evidence of pancreatic mass or inflammatory changes. Although limited by motion artifact, there is no evidence of pancreatic ductal dilatation. Spleen:  Within normal limits in size and appearance.  Adrenals/Urinary Tract: Several small benign-appearing renal cysts are seen bilaterally. No masses identified. No evidence of hydronephrosis. Stomach/Bowel: Tiny hiatal hernia noted.  Otherwise unremarkable. Vascular/Lymphatic: No pathologically enlarged lymph nodes identified. No acute vascular findings. Other:  None. Musculoskeletal:  No suspicious bone lesions identified. IMPRESSION: Image degradation by motion artifact noted. Cholelithiasis. No radiographic evidence of acute cholecystitis. Mild diffuse biliary ductal dilatation. No evidence of choledocholithiasis, pancreatic mass, or other obstructing etiology apparent. Tiny hiatal hernia. Electronically Signed   By: Danae Orleans M.D.   On: 03/02/2021 17:44   US Abdomen Limited RUQ (LIVER/GB)  Result Date: 03/06/2021 CLINICAL DATA:  Increased LFTs, nausea x6 months EXAM: ULTRASOUND ABDOMEN LIMITED RIGHT UPPER QUADRANT COMPARISON:  CT, MRI September 2022 FINDINGS: Gallbladder: Gallstone filled gallbladder. The gallbladder wall measures 4 mm in thickness. No pericholecystic fluid. Common bile duct: Diameter: 5 mm Liver: No focal lesion identified. Within normal limits in parenchymal echogenicity. Portal vein is patent on color Doppler imaging with normal direction of blood flow towards the liver. Other: None. IMPRESSION: Gallstone filled gallbladder without acute inflammatory changes. The liver appears normal. Electronically Signed   By: Olive Bass M.D.   On: 03/06/2021 09:26    Microbiology: No results found for this or any previous visit (from the past 240 hour(s)).   Labs: Basic Metabolic Panel: Recent Labs  Lab 03/14/21 1113  NA 137  K 4.2  CL 102  CO2 27  GLUCOSE 105  BUN 16  CREATININE 1.25*  CALCIUM 9.3   Liver Function  Tests: Recent Labs  Lab 03/14/21 1113  AST 19  ALT 43*  BILITOT 0.8  PROT 6.3   No results for input(s): LIPASE, AMYLASE in the last 168 hours. No results for input(s): AMMONIA in the last 168  hours. CBC: Recent Labs  Lab 03/14/21 1113  WBC 8.0  NEUTROABS 5,440  HGB 11.2*  HCT 34.3*  MCV 90.7  PLT 225   Cardiac Enzymes: No results for input(s): CKTOTAL, CKMB, CKMBINDEX, TROPONINI in the last 168 hours. BNP: BNP (last 3 results) No results for input(s): BNP in the last 8760 hours.  ProBNP (last 3 results) No results for input(s): PROBNP in the last 8760 hours.  CBG: No results for input(s): GLUCAP in the last 168 hours.     Signed:  Zannie Cove MD.  [facility] 03/20/2021, 3:04 PM

## 2021-04-07 ENCOUNTER — Other Ambulatory Visit (HOSPITAL_COMMUNITY): Payer: Self-pay | Admitting: Family Medicine

## 2021-04-07 DIAGNOSIS — Z1231 Encounter for screening mammogram for malignant neoplasm of breast: Secondary | ICD-10-CM

## 2021-04-18 DIAGNOSIS — Z23 Encounter for immunization: Secondary | ICD-10-CM | POA: Diagnosis not present

## 2021-05-01 ENCOUNTER — Other Ambulatory Visit: Payer: Self-pay

## 2021-05-01 ENCOUNTER — Ambulatory Visit (HOSPITAL_COMMUNITY)
Admission: RE | Admit: 2021-05-01 | Discharge: 2021-05-01 | Disposition: A | Discharge status: Home / Self Care | Payer: Medicare Other | Source: Ambulatory Visit | Attending: Family Medicine | Admitting: Family Medicine

## 2021-05-01 DIAGNOSIS — Z1231 Encounter for screening mammogram for malignant neoplasm of breast: Secondary | ICD-10-CM | POA: Diagnosis not present

## 2021-08-03 IMAGING — MR MR LUMBAR SPINE W/O CM
5 series · 31 of 48 positions shown · non-contrast
Comparison: None.

CLINICAL DATA: Low back pain bilateral leg pain

EXAM:
MRI LUMBAR SPINE WITHOUT CONTRAST
TECHNIQUE: Multiplanar, multisequence MR imaging of the lumbar spine was
performed. No intravenous contrast was administered.

[Series 5: STIR · sagittal · 4.0mm · 0.51mm/px · 1 of 15 slices shown]
[im 1/15]
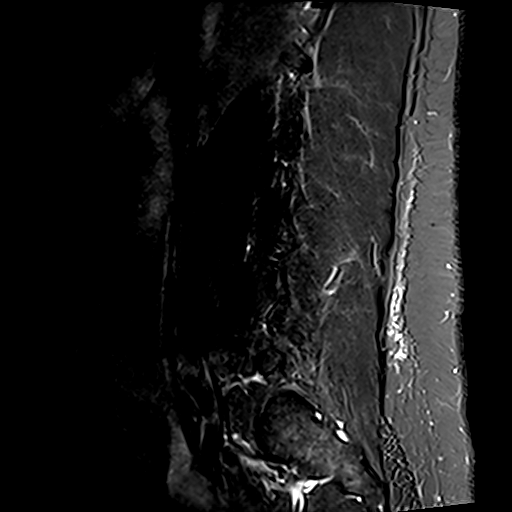

[Series 6: T1 · sagittal · 4.0mm · 0.81mm/px · 7 of 15 slices shown (1 of 2)]
[im 1/15]
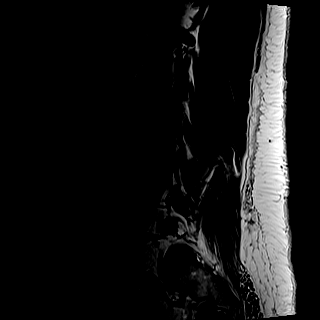
[im 3/15]
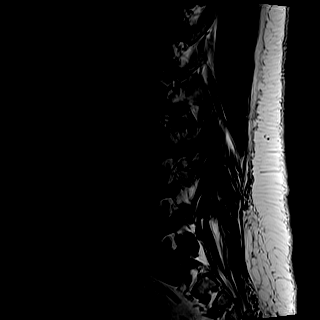
[im 5/15]
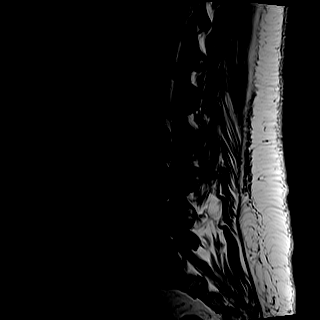
[im 8/15]
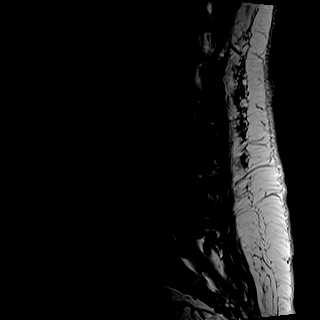
[im 10/15]
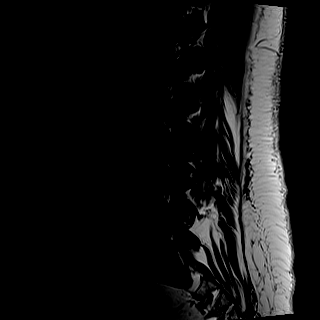
[im 12/15]
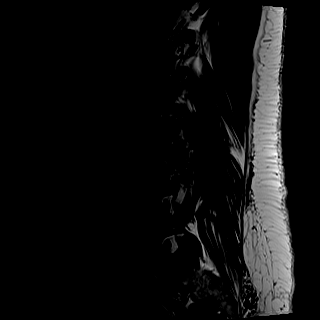
[im 15/15]
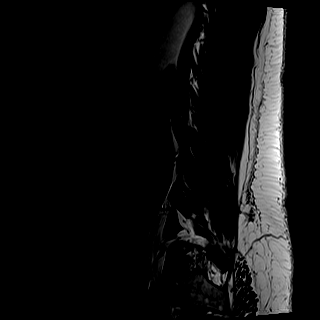

[Series 7: T2 · sagittal · 4.0mm · 0.68mm/px · 7 of 15 slices shown (1 of 2)]
[im 1/15]
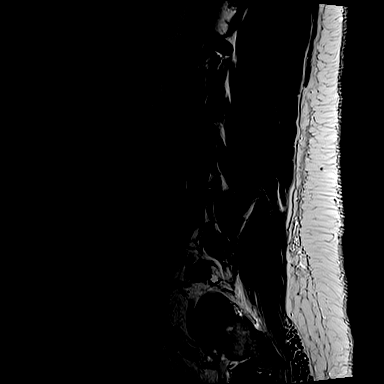
[im 3/15]
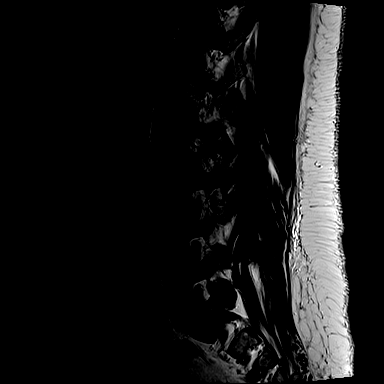
[im 5/15]
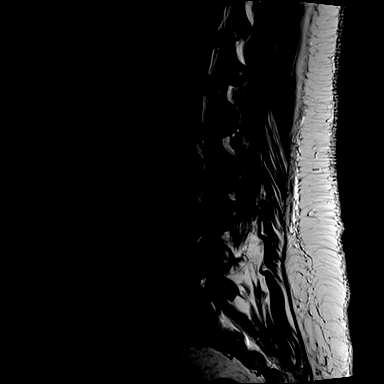
[im 8/15]
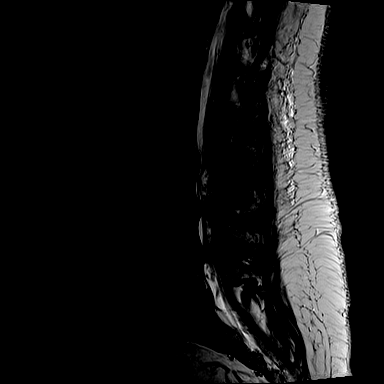
[im 10/15]
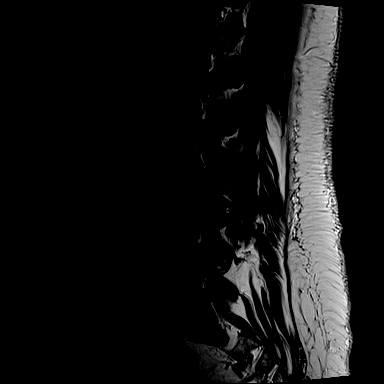
[im 12/15]
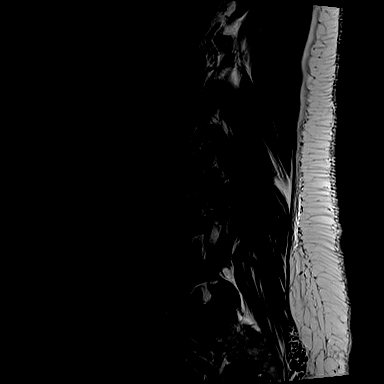
[im 15/15]
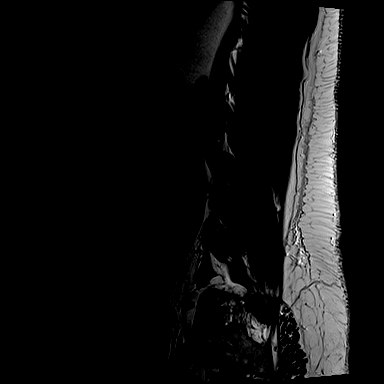

[Series 8: T2 · axial · 4.0mm · 0.70mm/px · z∈[-131,+42]mm · 8 of 29 slices shown (2 of 2)]
[im 1/29]
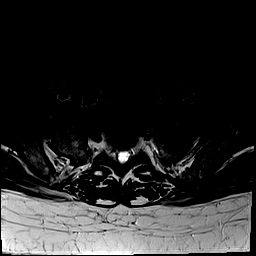
[im 5/29]
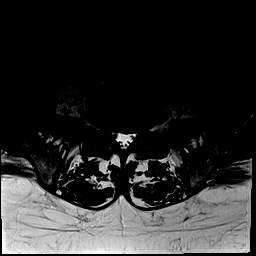
[im 9/29]
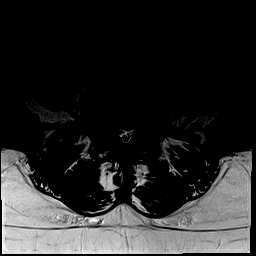
[im 13/29]
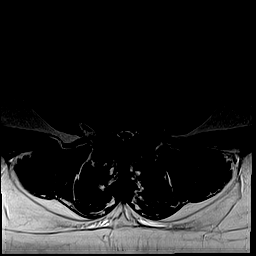
[im 16/29]
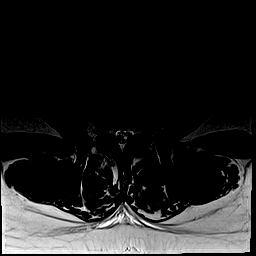
[im 20/29]
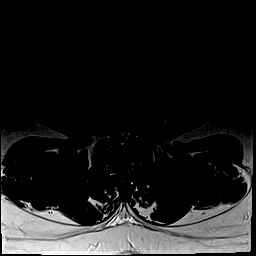
[im 24/29]
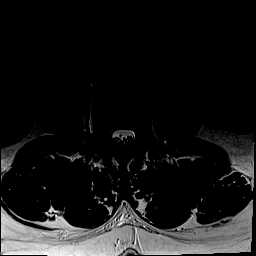
[im 29/29]
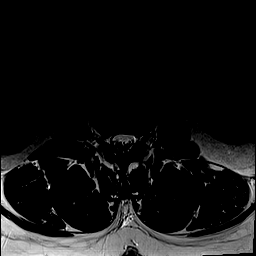

[Series 9: T1 · axial · 4.0mm · 0.35mm/px · z∈[-131,+42]mm · 8 of 29 slices shown (2 of 2)]
[im 1/29]
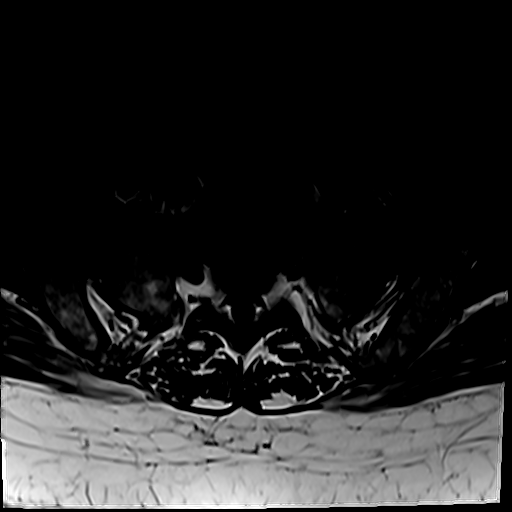
[im 5/29]
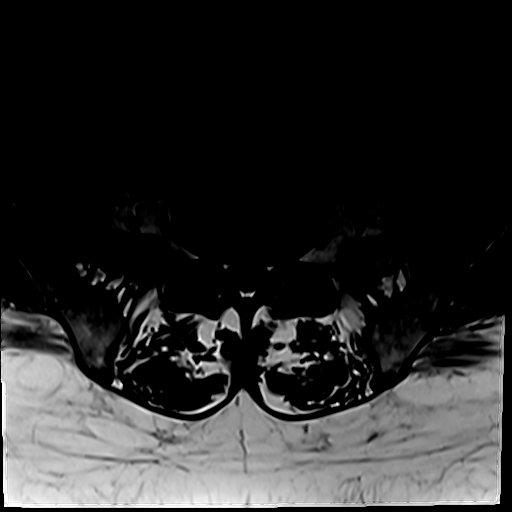
[im 9/29]
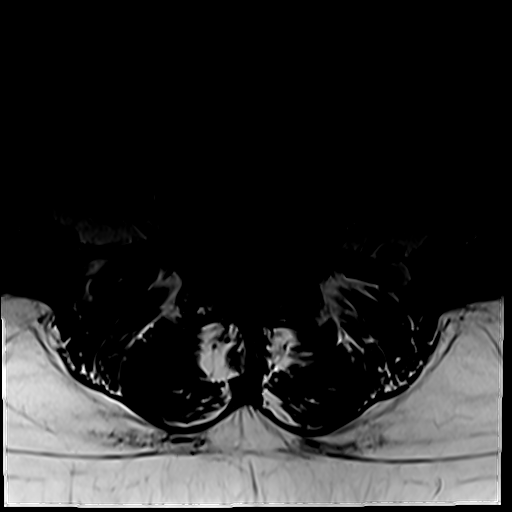
[im 13/29]
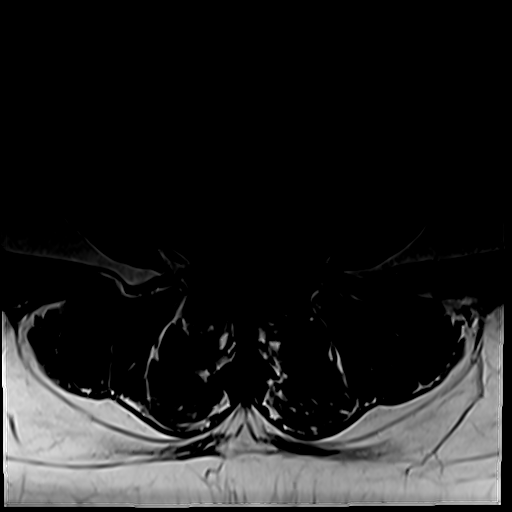
[im 16/29]
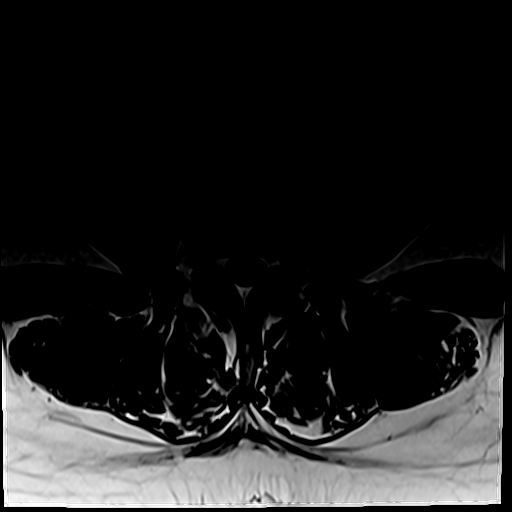
[im 20/29]
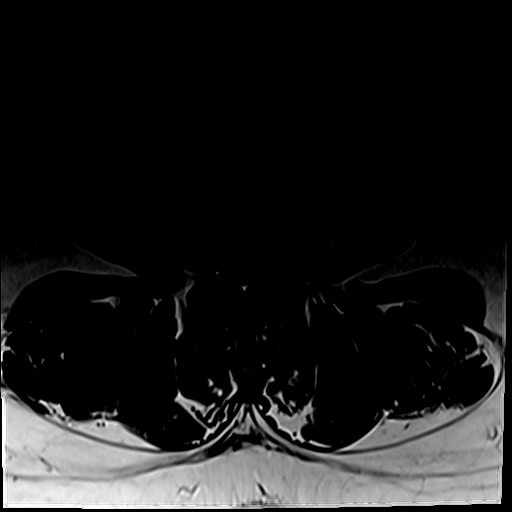
[im 24/29]
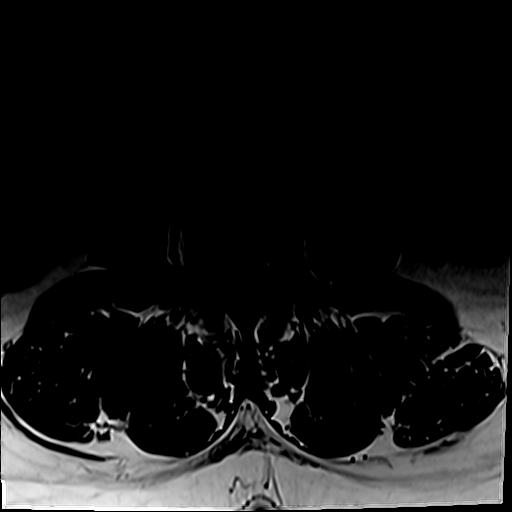
[im 29/29]
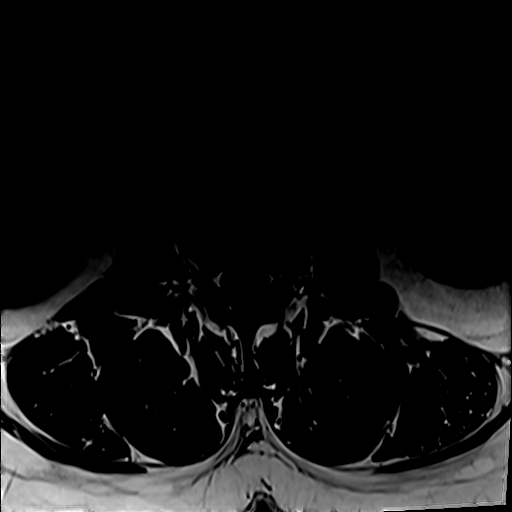

[31 of 48 positions shown; findings below may reference images not displayed]

FINDINGS: Segmentation:  Normal

Alignment:  Mild anterolisthesis L2-3.  Remaining alignment normal.

Vertebrae: Normal bone marrow. Negative for fracture or mass. 1 cm
lesion right iliac bone likely hemangioma.

Conus medullaris and cauda equina: Conus extends to the L1 level.
Conus and cauda equina appear normal.

Paraspinal and other soft tissues: Negative for paraspinous mass or
adenopathy.

Disc levels:

L1-2: Mild disc and facet degeneration. Negative for disc protrusion
or stenosis

L2-3: Mild anterolisthesis with moderate facet degeneration and
diffuse bulging of the disc. Moderate spinal stenosis and moderate
subarticular stenosis bilaterally.

L3-4: Diffuse disc bulging and endplate spurring. Moderate facet and
ligamentum flavum hypertrophy. Severe spinal stenosis and severe
subarticular stenosis bilaterally.

L4-5: Diffuse disc bulging and endplate spurring. Moderate facet and
ligamentum flavum hypertrophy. Severe spinal stenosis and severe
subarticular stenosis bilaterally

L5-S1: Mild subarticular stenosis bilaterally due to facet
hypertrophy.
IMPRESSION: Moderate spinal stenosis and moderate subarticular stenosis
bilaterally at L2-3.

Severe spinal stenosis and severe subarticular stenosis bilaterally
at L3-4 and L4-5.

## 2021-08-08 DIAGNOSIS — M65331 Trigger finger, right middle finger: Secondary | ICD-10-CM | POA: Diagnosis not present

## 2021-09-05 DIAGNOSIS — M65331 Trigger finger, right middle finger: Secondary | ICD-10-CM | POA: Diagnosis not present

## 2022-01-23 DIAGNOSIS — K219 Gastro-esophageal reflux disease without esophagitis: Secondary | ICD-10-CM | POA: Diagnosis not present

## 2022-01-23 DIAGNOSIS — N1831 Chronic kidney disease, stage 3a: Secondary | ICD-10-CM | POA: Diagnosis not present

## 2022-01-23 DIAGNOSIS — I422 Other hypertrophic cardiomyopathy: Secondary | ICD-10-CM | POA: Diagnosis not present

## 2022-01-23 DIAGNOSIS — R946 Abnormal results of thyroid function studies: Secondary | ICD-10-CM | POA: Diagnosis not present

## 2022-01-23 DIAGNOSIS — R7309 Other abnormal glucose: Secondary | ICD-10-CM | POA: Diagnosis not present

## 2022-01-23 DIAGNOSIS — Z5181 Encounter for therapeutic drug level monitoring: Secondary | ICD-10-CM | POA: Diagnosis not present

## 2022-01-23 DIAGNOSIS — Z0000 Encounter for general adult medical examination without abnormal findings: Secondary | ICD-10-CM | POA: Diagnosis not present

## 2022-01-23 DIAGNOSIS — I129 Hypertensive chronic kidney disease with stage 1 through stage 4 chronic kidney disease, or unspecified chronic kidney disease: Secondary | ICD-10-CM | POA: Diagnosis not present

## 2022-01-30 DIAGNOSIS — Z87440 Personal history of urinary (tract) infections: Secondary | ICD-10-CM | POA: Diagnosis not present

## 2022-01-30 DIAGNOSIS — Z0000 Encounter for general adult medical examination without abnormal findings: Secondary | ICD-10-CM | POA: Diagnosis not present

## 2022-01-30 DIAGNOSIS — Z9109 Other allergy status, other than to drugs and biological substances: Secondary | ICD-10-CM | POA: Diagnosis not present

## 2022-01-30 DIAGNOSIS — R21 Rash and other nonspecific skin eruption: Secondary | ICD-10-CM | POA: Diagnosis not present

## 2022-01-30 DIAGNOSIS — M25561 Pain in right knee: Secondary | ICD-10-CM | POA: Diagnosis not present

## 2022-02-07 DIAGNOSIS — M25561 Pain in right knee: Secondary | ICD-10-CM | POA: Diagnosis not present

## 2022-02-21 DIAGNOSIS — M1712 Unilateral primary osteoarthritis, left knee: Secondary | ICD-10-CM | POA: Diagnosis not present

## 2022-04-05 ENCOUNTER — Other Ambulatory Visit (HOSPITAL_COMMUNITY): Payer: Self-pay | Admitting: Family Medicine

## 2022-04-05 DIAGNOSIS — Z1231 Encounter for screening mammogram for malignant neoplasm of breast: Secondary | ICD-10-CM

## 2022-04-17 DIAGNOSIS — Z23 Encounter for immunization: Secondary | ICD-10-CM | POA: Diagnosis not present

## 2022-05-03 ENCOUNTER — Ambulatory Visit (HOSPITAL_COMMUNITY)
Admission: RE | Admit: 2022-05-03 | Discharge: 2022-05-03 | Disposition: A | Discharge status: Home / Self Care | Payer: Medicare Other | Source: Ambulatory Visit | Attending: Family Medicine | Admitting: Family Medicine

## 2022-05-03 DIAGNOSIS — Z1231 Encounter for screening mammogram for malignant neoplasm of breast: Secondary | ICD-10-CM | POA: Insufficient documentation

## 2022-10-10 ENCOUNTER — Ambulatory Visit: Payer: Medicare Other | Attending: Cardiology | Admitting: Cardiology

## 2022-10-10 ENCOUNTER — Encounter: Payer: Self-pay | Admitting: Cardiology

## 2022-10-10 VITALS — BP 134/70 | HR 67 | Ht 67.0 in | Wt 251.6 lb

## 2022-10-10 DIAGNOSIS — I422 Other hypertrophic cardiomyopathy: Secondary | ICD-10-CM | POA: Diagnosis not present

## 2022-10-10 DIAGNOSIS — I10 Essential (primary) hypertension: Secondary | ICD-10-CM

## 2022-10-10 NOTE — Patient Instructions (Signed)
Medication Instructions:  Your physician recommends that you continue on your current medications as directed. Please refer to the Current Medication list given to you today.  *If you need a refill on your cardiac medications before your next appointment, please call your pharmacy*   Lab Work: None If you have labs (blood work) drawn today and your tests are completely normal, you will receive your results only by: MyChart Message (if you have MyChart) OR A paper copy in the mail If you have any lab test that is abnormal or we need to change your treatment, we will call you to review the results.   Testing/Procedures: Your physician has requested that you have an echocardiogram. Echocardiography is a painless test that uses sound waves to create images of your heart. It provides your doctor with information about the size and shape of your heart and how well your heart's chambers and valves are working. This procedure takes approximately one hour. There are no restrictions for this procedure. Please do NOT wear cologne, perfume, aftershave, or lotions (deodorant is allowed). Please arrive 15 minutes prior to your appointment time.    Follow-Up: At [facility], you and your health needs are our priority.  As part of our continuing mission to provide you with exceptional heart care, we have created designated Provider Care Teams.  These Care Teams include your primary Cardiologist (physician) and Advanced Practice Providers (APPs -  Physician Assistants and Nurse Practitioners) who all work together to provide you with the care you need, when you need it.  We recommend signing up for the patient portal called "MyChart".  Sign up information is provided on this After Visit Summary.  MyChart is used to connect with patients for Virtual Visits (Telemedicine).  Patients are able to view lab/test results, encounter notes, upcoming appointments, etc.  Non-urgent messages can be sent to your  provider as well.   To learn more about what you can do with MyChart, go to [email or web address].    Your next appointment:   Follow up pending testing results   Provider:   Nona Dell, MD    Other Instructions

## 2022-10-10 NOTE — Progress Notes (Signed)
    Cardiology Office Note  Date: 10/10/2022   ID: Traci Chandler, DOB 1956/05/10, MRN [id number]  History of Present Illness: Traci Chandler is a 67 y.o. female not seen since September 2021.  She presents for a follow-up visit.  Describes NYHA class II-III dyspnea on exertion over the last several months, no chest pain with activity, no palpitations or syncope.  We went over her medications, she reports compliance with therapy.  Has occasional mild leg edema mainly in the hot weather.  No orthopnea or PND.  I reviewed her ECG which shows sinus rhythm with prolonged PR interval and nonspecific T wave changes.  Physical Exam: VS:  BP 134/70   Pulse 67   Ht  (1.702 m)   Wt 251 lb 9.6 oz (114.1 kg)   SpO2 98%   BMI 39.41 kg/m , BMI Body mass index is 39.41 kg/m.  Wt Readings from Last 3 Encounters:  10/10/22 251 lb 9.6 oz (114.1 kg)  03/14/21 236 lb (107 kg)  03/06/21 242 lb 1 oz (109.8 kg)    General: Patient appears comfortable at rest. HEENT: Conjunctiva and lids normal. Neck: Supple, no elevated JVP or carotid bruits. Lungs: Clear to auscultation, nonlabored breathing at rest. Cardiac: Regular rate and rhythm, no S3, 1-2/6 systolic murmur. Extremities: No pitting edema.  ECG:  An ECG dated 03/02/2021 was personally reviewed today and demonstrated:  Sinus rhythm with prolonged PR interval, LVH with leftward axis and IVCD.  Labwork:  September 2022: BUN 16, creatinine 1.25, potassium 4.2, AST 19, ALT 43, hemoglobin 11.2, platelets 225  Other Studies Reviewed Today:  No interval cardiac testing for review today.  Assessment and Plan:  1.  Hypertrophic nonobstructive cardiomyopathy.  Last echocardiogram was in 2019 revealing LVEF 65 to 70% with severe asymmetric septal hypertrophy and systolic anterior motion of the anterior mitral leaflet although no significant LVOT gradient.  She reports NYHA class 2-3 dyspnea on exertion as discussed above, no chest pain or  syncope.  ECG reviewed, voltage not markedly increased.  Plan to update echocardiogram.  May need to consider cardiac MRI as well depending on results.  For now no changes in medications.  2.  Essential hypertension.  She continues on nifedipine, atenolol, and Aldactone.  Disposition:  Follow up  test results.  Signed, Jonelle Sidle, M.D., F.A.C.C. [facility]

## 2022-11-05 ENCOUNTER — Ambulatory Visit (HOSPITAL_COMMUNITY)
Admission: RE | Admit: 2022-11-05 | Discharge: 2022-11-05 | Disposition: A | Discharge status: Home / Self Care | Payer: Medicare Other | Source: Ambulatory Visit | Attending: Cardiology | Admitting: Cardiology

## 2022-11-05 DIAGNOSIS — I422 Other hypertrophic cardiomyopathy: Secondary | ICD-10-CM | POA: Diagnosis not present

## 2022-11-05 LAB — ECHOCARDIOGRAM COMPLETE
AR max vel: 4.36 cm2
AV Area VTI: 3.66 cm2
AV Area mean vel: 4.81 cm2
AV Mean grad: 3 mmHg
AV Peak grad: 6.7 mmHg
Ao pk vel: 1.29 m/s
Area-P 1/2: 4.15 cm2
P 1/2 time: 656 msec
S' Lateral: 2.5 cm

## 2022-11-05 NOTE — Progress Notes (Signed)
*  PRELIMINARY RESULTS* Echocardiogram 2D Echocardiogram has been performed.  Stacey Drain 11/05/2022, 11:10 AM

## 2022-11-06 ENCOUNTER — Telehealth: Payer: Self-pay

## 2022-11-06 DIAGNOSIS — I421 Obstructive hypertrophic cardiomyopathy: Secondary | ICD-10-CM

## 2022-11-06 NOTE — Telephone Encounter (Signed)
Echo results discussed with patient and she agrees to se Dr.Chandrasekhar inconsultation for HOCM.

## 2022-11-06 NOTE — Telephone Encounter (Signed)
-----   Message from Jonelle Sidle, MD sent at 11/05/2022  1:30 PM EDT ----- Results reviewed.  Follow-up echocardiogram consistent with hypertrophic cardiomyopathy, severe asymmetric septal LV hypertrophy, but no obvious LVOT gradient and LVEF 65 to 70%.  See if she would be willing to undergo evaluation with Dr. Raynelle Jan in our [place] office as it relates to other potential testing and treatment options for HOCM.

## 2023-01-11 ENCOUNTER — Ambulatory Visit (INDEPENDENT_AMBULATORY_CARE_PROVIDER_SITE_OTHER): Payer: Medicare Other

## 2023-01-11 ENCOUNTER — Encounter: Payer: Self-pay | Admitting: Internal Medicine

## 2023-01-11 ENCOUNTER — Ambulatory Visit: Payer: Medicare Other | Attending: Internal Medicine | Admitting: Internal Medicine

## 2023-01-11 ENCOUNTER — Ambulatory Visit: Payer: Medicare Other | Admitting: Genetic Counselor

## 2023-01-11 VITALS — BP 130/60 | HR 67 | Ht 67.0 in | Wt 250.0 lb

## 2023-01-11 DIAGNOSIS — I700 Atherosclerosis of aorta: Secondary | ICD-10-CM | POA: Diagnosis not present

## 2023-01-11 DIAGNOSIS — I440 Atrioventricular block, first degree: Secondary | ICD-10-CM

## 2023-01-11 DIAGNOSIS — E782 Mixed hyperlipidemia: Secondary | ICD-10-CM

## 2023-01-11 DIAGNOSIS — R072 Precordial pain: Secondary | ICD-10-CM

## 2023-01-11 DIAGNOSIS — I422 Other hypertrophic cardiomyopathy: Secondary | ICD-10-CM

## 2023-01-11 MED ORDER — METOPROLOL TARTRATE 25 MG PO TABS: 25.0000 mg | ORAL_TABLET | Freq: Once | ORAL | 0 refills | Status: DC

## 2023-01-11 NOTE — Progress Notes (Unsigned)
Enrolled patient for a 14 day Zio XT  monitor to be mailed to patients home  °

## 2023-01-11 NOTE — Patient Instructions (Addendum)
Medication Instructions:  Your physician recommends that you continue on your current medications as directed. Please refer to the Current Medication list given to you today.  *If you need a refill on your cardiac medications before your next appointment, please call your pharmacy*   Lab Work: BMP, A1C, LDL Direct  If you have labs (blood work) drawn today and your tests are completely normal, you will receive your results only by: MyChart Message (if you have MyChart) OR A paper copy in the mail If you have any lab test that is abnormal or we need to change your treatment, we will call you to review the results.   Testing/Procedures: Your physician has requested that you wear a heart monitor.   Your physician has requested that you have cardiac CT. Cardiac computed tomography (CT) is a painless test that uses an x-ray machine to take clear, detailed pictures of your heart. For further information please visit [email or web address] Please follow instruction sheet as given.     Follow-Up: At [facility], you and your health needs are our priority.  As part of our continuing mission to provide you with exceptional heart care, we have created designated Provider Care Teams.  These Care Teams include your primary Cardiologist (physician) and Advanced Practice Providers (APPs -  Physician Assistants and Nurse Practitioners) who all work together to provide you with the care you need, when you need it.  We recommend signing up for the patient portal called "MyChart".  Sign up information is provided on this After Visit Summary.  MyChart is used to connect with patients for Virtual Visits (Telemedicine).  Patients are able to view lab/test results, encounter notes, upcoming appointments, etc.  Non-urgent messages can be sent to your provider as well.   To learn more about what you can do with MyChart, go to [email or web address].    Your next appointment:   4 month(s)  Provider:    Riley Lam, MD   Other Instructions  Your cardiac CT will be scheduled at one of the below locations:   [facility] [address] [place], [place] [address] [phone]  OR  [facility] [address] [place], [place] [address] [phone]  OR   [facility] [address] [place], [place] [address] [phone]  If scheduled at [facility], please arrive at the [facility] and Children's Entrance (Entrance C2) of [facility] 30 minutes prior to test start time. You can use the FREE valet parking offered at entrance C (encouraged to control the heart rate for the test)  Proceed to the [facility] Radiology Department (first floor) to check-in and test prep.  All radiology patients and guests should use entrance C2 at [facility], accessed from [facility], even though the hospital's physical address listed is [address].    If scheduled at [facility] or [facility], please arrive 15 mins early for check-in and test prep.  There is spacious parking and easy access to the radiology department from the [facility] Heart and Vascular entrance. Please enter here and check-in with the desk attendant.   Please follow these instructions carefully (unless otherwise directed):  An IV will be required for this test and Nitroglycerin will be given.  Hold all erectile dysfunction medications at least 3 days (72 hrs) prior to test. (Ie viagra, cialis, sildenafil, tadalafil, etc)   On the Night Before the Test:  Be sure to Drink plenty of water. Do not consume any caffeinated/decaffeinated beverages or chocolate 12 hours prior to your test. Do not take any antihistamines 12 hours prior to your test.  On the Day of the Test: Drink plenty of water until 1 hour prior to the test. Do not eat any food 1 hour  prior to test. You may take your regular medications prior to the test.  Take metoprolol (Lopressor) 25 mg by mouth two hours prior to test. If you take Furosemide/Hydrochlorothiazide/Spironolactone, please HOLD on the morning of the test. FEMALES- please wear underwire-free bra if available, avoid dresses & tight clothing        After the Test: Drink plenty of water. After receiving IV contrast, you may experience a mild flushed feeling. This is normal. On occasion, you may experience a mild rash up to 24 hours after the test. This is not dangerous. If this occurs, you can take Benadryl 25 mg and increase your fluid intake. If you experience trouble breathing, this can be serious. If it is severe call 911 IMMEDIATELY. If it is mild, please call our office. If you take any of these medications: Glipizide/Metformin, Avandament, Glucavance, please do not take 48 hours after completing test unless otherwise instructed.  We will call to schedule your test 2-4 weeks out understanding that some insurance companies will need an authorization prior to the service being performed.   For more information and frequently asked questions, please visit our website : [email or web address]  For non-scheduling related questions, please contact the cardiac imaging nurse navigator should you have any questions/concerns: Cardiac Imaging Nurse Navigators Direct Office Dial: [phone]   For scheduling needs, including cancellations and rescheduling, please call [place], [phone].     ZIO XT- Long Term Monitor Instructions  Your physician has requested you wear a ZIO patch monitor for 14 days.  This is a single patch monitor. [employer] supplies one patch monitor per enrollment. Additional stickers are not available. Please do not apply patch if you will be having a Nuclear Stress Test,   Cardiac CT, MRI, or Chest Xray during the period you would be wearing the  monitor. The patch cannot be  worn during these tests. You cannot remove and re-apply the  ZIO XT patch monitor.  Your ZIO patch monitor will be mailed 3 day USPS to your address on file. It may take 3-5 days  to receive your monitor after you have been enrolled.  Once you have received your monitor, please review the enclosed instructions. Your monitor  has already been registered assigning a specific monitor serial # to you.  Billing and Patient Assistance Program Information  We have supplied [employer] with any of your insurance information on file for billing purposes. [employer] offers a sliding scale Patient Assistance Program for patients that do not have  insurance, or whose insurance does not completely cover the cost of the ZIO monitor.  You must apply for the Patient Assistance Program to qualify for this discounted rate.  To apply, please call [employer] at [phone], select option 4, select option 2, ask to apply for  Patient Assistance Program. Meredeth Ide will ask your household income, and how many people  are in your household. They will quote your out-of-pocket cost based on that information.  [employer] will also be able to set up a 7-month, interest-free payment plan if needed.  Applying the monitor   Shave hair from upper left chest.  Hold abrader disc by orange tab. Rub abrader in 40  strokes over the upper left chest as  indicated in your monitor instructions.  Clean area with 4 enclosed alcohol pads. Let dry.  Apply patch as indicated in monitor instructions. Patch will be placed under collarbone on left  side of chest with arrow pointing upward.  Rub patch adhesive wings for 2 minutes. Remove white label marked "1". Remove the white  label marked "2". Rub patch adhesive wings for 2 additional minutes.  While looking in a mirror, press and release button in center of patch. A small green light will  flash 3-4 times. This will be your only indicator that the monitor has been turned on.  Do not shower for  the first 24 hours. You may shower after the first 24 hours.  Press the button if you feel a symptom. You will hear a small click. Record Date, Time and  Symptom in the Patient Logbook.  When you are ready to remove the patch, follow instructions on the last 2 pages of Patient  Logbook. Stick patch monitor onto the last page of Patient Logbook.  Place Patient Logbook in the blue and white box. Use locking tab on box and tape box closed  securely. The blue and white box has prepaid postage on it. Please place it in the mailbox as  soon as possible. Your physician should have your test results approximately 7 days after the  monitor has been mailed back to [facility].  Call [facility] Customer Care at [phone] if you have questions regarding  your ZIO XT patch monitor. Call them immediately if you see an orange light blinking on your  monitor.  If your monitor falls off in less than 4 days, contact our Monitor department at [phone].  If your monitor becomes loose or falls off after 4 days call [employer] at [phone] for  suggestions on securing your monitor

## 2023-01-11 NOTE — Progress Notes (Signed)
Cardiology Office Note:    Date:  01/11/2023   ID:  YARIELA HARSEY, DOB [birth year], MRN [id number]  PCP:  Irena Reichmann, DO   [facility] Providers Cardiologist:  Nona Dell, MD     Referring MD: Jonelle Sidle, MD   CC: HCM Consulted for the evaluation of HCM at the behest of Dr. Diona Browner  History of Present Illness:    Traci Chandler is a 67 y.o. female with a hx of HTN, Morbid Obesity.  Patient notes rare SOB at rest but DOE with walking.  On combination CCB and atenolol-.  Rides 30 minutes a day and feels good.  Notes no fatigue. Notes two discrete episodes of sternal pain, no  palpitations Notes chest heaviness without prodrome or exercise. Never on the bike.  Improved with ASA and ginger. Notes no dizziness. Notes no syncope. When the heaviness occurs. No leg swelling.  No weight gain.  Has previously tried no other therapies.   Notable family events include No HCM.  No SCD.  Had a sister who passed away from HF NOS at age 67.  Second sister passed away at age 75s from HF NOS.  Sister had an autopsy; patient has the report at home. Had a daughter passed away from heart failure at [facility]. Has a son. Brother passed from COPD.    Past Medical History:  Diagnosis Date   Essential hypertension    GERD (gastroesophageal reflux disease)    Hypertrophic non-obstructive cardiomyopathy (HCC)    Echocardiogram 2011    Past Surgical History:  Procedure Laterality Date   ABDOMINAL HYSTERECTOMY     CHOLECYSTECTOMY N/A 03/07/2021   Procedure: LAPAROSCOPIC CHOLECYSTECTOMY;  Surgeon: [name], MD;  Location: [facility];  Service: General;  Laterality: N/A;   COLONOSCOPY N/A 04/15/2013   Procedure: COLONOSCOPY;  Surgeon: West Bali, MD;  Location: [facility] END[facility] SUITE;  Service: Endoscopy;  Laterality: N/A;  9:30    Current Medications: Current Meds  Medication Sig   aspirin 81 MG tablet Take 81 mg by mouth every evening.    atenolol (TENORMIN) 50 MG  tablet Take 1 tablet by mouth daily.   metoprolol tartrate (LOPRESSOR) 25 MG tablet Take 1 tablet (25 mg total) by mouth once for 1 dose. Take 2 hours prior to Cardiac CT   NIFEdipine (PROCARDIA XL/NIFEDICAL XL) 60 MG 24 hr tablet Take 60 mg by mouth daily.   omeprazole (PRILOSEC) 20 MG capsule TAKE 1 CAPSULE 2 TIMES A DAY BEFORE A MEAL.   polyethylene glycol (MIRALAX / GLYCOLAX) 17 g packet Take 17 g by mouth daily.   spironolactone (ALDACTONE) 25 MG tablet Take 25 mg by mouth 2 (two) times daily.   traMADol (ULTRAM) 50 MG tablet as needed for severe pain.   triamcinolone cream (KENALOG) 0.1 % as needed (rash).     Allergies:   Codeine, Diclofenac, and Lisinopril   Social History   Socioeconomic History   Marital status: Married    Spouse name: Not on file   Number of children: Not on file   Years of education: Not on file   Highest education level: Not on file  Occupational History   Occupation: employed    Employer: [employer]    Comment: [employer]  Tobacco Use   Smoking status: Never   Smokeless tobacco: Never  Vaping Use   Vaping status: Never Used  Substance and Sexual Activity   Alcohol use: No   Drug use: No  Sexual activity: Yes    Birth control/protection: Surgical  Other Topics Concern   Not on file  Social History Narrative   Not on file   Social Determinants of Health   Financial Resource Strain: Not on file  Food Insecurity: Not on file  Transportation Needs: Not on file  Physical Activity: Not on file  Stress: Not on file  Social Connections: Not on file     Family History: The patient's family history includes Diabetes type II in her mother and sister; Hypertension in her mother and sister; Prostate cancer in her father. There is no history of Colon cancer.  ROS:   Please see the history of present illness.     EKGs/Labs/Other Studies Reviewed:    The following studies were reviewed today:  Cardiac Studies & Procedures        ECHOCARDIOGRAM  ECHOCARDIOGRAM COMPLETE 11/05/2022  Narrative ECHOCARDIOGRAM REPORT    Patient Name:   Traci [facility] Date of Exam: 11/05/2022 Medical Rec #:  [id number]       Height:       67.0 in Accession #:    [id number]      Weight:       251.6 lb Date of Birth:  [birth year]        BSA:          2.229 m Patient Age:    66 years        BP:           137/77 mmHg Patient Gender: F               HR:           67 bpm. [facility]  Procedure: 2D Echo, Cardiac Doppler and Color Doppler  Indications:    Hypertrophic cardiomyopathy (HCC) [829562]  History:        Patient has prior history of Echocardiogram examinations, most recent 03/12/2018. Risk Factors:Hypertension. Hypertrophic non-obstructive cardiomyopathy (HCC) (From Hx).  Sonographer:    Celesta Gentile RCS Referring Phys: [phone] SAMUEL G MCDOWELL  IMPRESSIONS   1. Severe asymmetrical septal hypertrophy measuring 2 cm. There is mild SAM of the anterior MV leaflet with mildly turbulent LVOT flow but without significant gradient at rest or with Valsalva. Findings consistent with hypertrophic cardiomyopathy. . Left ventricular ejection fraction, by estimation, is 65 to 70%. The left ventricle has normal function. The left ventricle has no regional wall motion abnormalities. There is severe asymmetric left ventricular hypertrophy of the septal segment. Left ventricular diastolic parameters are indeterminate. 2. Right ventricular systolic function is normal. The right ventricular size is normal. Tricuspid regurgitation signal is inadequate for assessing PA pressure. 3. Left atrial size was moderately dilated. 4. The mitral valve is normal in structure. Trivial mitral valve regurgitation. No evidence of mitral stenosis. 5. The aortic valve was not well visualized. Aortic valve regurgitation is mild. No aortic stenosis is present. 6. The inferior vena cava is normal in size with greater than 50% respiratory variability,  suggesting right atrial pressure of 3 mmHg.  FINDINGS Left Ventricle: Severe asymmetrical septal hypertrophy measuring 2 cm. There is mild SAM of the anterior MV leaflet with mildly turbulent LVOT flow but without significant gradient at rest or with Valsalva. Findings consistent with hypertrophic cardiomyopathy. Left ventricular ejection fraction, by estimation, is 65 to 70%. The left ventricle has normal function. The left ventricle has no regional wall motion abnormalities. The left ventricular internal cavity size was normal in size. There is  severe asymmetric left ventricular hypertrophy of the septal segment. Left ventricular diastolic parameters are indeterminate.  Right Ventricle: The right ventricular size is normal. Right vetricular wall thickness was not well visualized. Right ventricular systolic function is normal. Tricuspid regurgitation signal is inadequate for assessing PA pressure.  Left Atrium: Left atrial size was moderately dilated.  Right Atrium: Right atrial size was normal in size.  Pericardium: There is no evidence of pericardial effusion.  Mitral Valve: The mitral valve is normal in structure. Trivial mitral valve regurgitation. No evidence of mitral valve stenosis.  Tricuspid Valve: The tricuspid valve is normal in structure. Tricuspid valve regurgitation is not demonstrated. No evidence of tricuspid stenosis.  Aortic Valve: The aortic valve was not well visualized. Aortic valve regurgitation is mild. Aortic regurgitation PHT measures 656 msec. No aortic stenosis is present. Aortic valve mean gradient measures 3.0 mmHg. Aortic valve peak gradient measures 6.7 mmHg. Aortic valve area, by VTI measures 3.66 cm.  Pulmonic Valve: The pulmonic valve was not well visualized. Pulmonic valve regurgitation is mild. No evidence of pulmonic stenosis.  Aorta: The aortic root is normal in size and structure.  Venous: The inferior vena cava is normal in size with greater than  50% respiratory variability, suggesting right atrial pressure of 3 mmHg.  IAS/Shunts: No atrial level shunt detected by color flow Doppler.   LEFT VENTRICLE PLAX 2D LVIDd:         4.00 cm   Diastology LVIDs:         2.50 cm   LV e' medial:    4.57 cm/s LV PW:         1.50 cm   LV E/e' medial:  17.9 LV IVS:        2.00 cm   LV e' lateral:   7.29 cm/s LVOT diam:     2.20 cm   LV E/e' lateral: 11.2 LV SV:         128 LV SV Index:   57 LVOT Area:     3.80 cm   RIGHT VENTRICLE             IVC RV S prime:     13.90 cm/s  IVC diam: 1.95 cm TAPSE (M-mode): 2.9 cm  LEFT ATRIUM              Index        RIGHT ATRIUM           Index LA diam:        3.50 cm  1.57 cm/m   RA Area:     20.40 cm LA Vol (A2C):   79.2 ml  35.52 ml/m  RA Volume:   60.00 ml  26.91 ml/m LA Vol (A4C):   105.0 ml 47.10 ml/m LA Biplane Vol: 98.4 ml  44.14 ml/m AORTIC VALVE AV Area (Vmax):    4.36 cm AV Area (Vmean):   4.81 cm AV Area (VTI):     3.66 cm AV Vmax:           129.00 cm/s AV Vmean:          86.200 cm/s AV VTI:            0.350 m AV Peak Grad:      6.7 mmHg AV Mean Grad:      3.0 mmHg LVOT Vmax:         148.00 cm/s LVOT Vmean:        109.000 cm/s LVOT VTI:  0.337 m LVOT/AV VTI ratio: 0.96 AI PHT:            656 msec  AORTA Ao Root diam: 3.50 cm  MITRAL VALVE MV Area (PHT): 4.15 cm    SHUNTS MV Decel Time: 183 msec    Systemic VTI:  0.34 m MV E velocity: 81.90 cm/s  Systemic Diam: 2.20 cm MV A velocity: 77.10 cm/s MV E/A ratio:  1.06  Dina Rich MD Electronically signed by Dina Rich MD Signature Date/Time: 11/05/2022/12:52:56 PM    Final                   Recent Labs: No results found for requested labs within last 365 days.  Recent Lipid Panel    Component Value Date/Time   CHOL  05/23/2010 0500    141        ATP III CLASSIFICATION:  <200     mg/dL   Desirable  161-096  mg/dL   Borderline High  >=045    mg/dL   High          TRIG 69 05/23/2010  0500   HDL 39 (L) 05/23/2010 0500   CHOLHDL 3.6 05/23/2010 0500   VLDL 14 05/23/2010 0500   LDLCALC  05/23/2010 0500    88        Total Cholesterol/HDL:CHD Risk Coronary Heart Disease Risk Table                     Men   Women  1/2 Average Risk   3.4   3.3  Average Risk       5.0   4.4  2 X Average Risk   9.6   7.1  3 X Average Risk  23.4   11.0        Use the calculated Patient Ratio above and the CHD Risk Table to determine the patient's CHD Risk.        ATP III CLASSIFICATION (LDL):  <100     mg/dL   Optimal  409-811  mg/dL   Near or Above                    Optimal  130-159  mg/dL   Borderline  914-782  mg/dL   High  >956     mg/dL   Very High     Physical Exam:    VS:  BP 130/60   Pulse 67   Ht 5\' 7"  (1.702 m)   Wt 250 lb (113.4 kg)   SpO2 96%   BMI 39.16 kg/m     Wt Readings from Last 3 Encounters:  01/11/23 250 lb (113.4 kg)  10/10/22 251 lb 9.6 oz (114.1 kg)  03/14/21 236 lb (107 kg)    GEN: NAD morbid obesity HEENT: Normal NECK: No JVD CARDIAC: RRR, systolic murmurs with standing, no rubs, gallops RESPIRATORY:  Clear to auscultation without rales, wheezing or rhonchi  ABDOMEN: Soft, non-tender, non-distended MUSCULOSKELETAL:  No edema; No deformity  SKIN: Warm and dry NEUROLOGIC:  Alert and oriented x 3 PSYCHIATRIC:  Normal affect   ASSESSMENT:    1. Hypertrophic cardiomyopathy (HCC)   2. Precordial pain   3. Morbid obesity (HCC)   4. Aortic atherosclerosis (HCC)   5. Mixed hyperlipidemia   6. 1st degree AV block    PLAN:    Hypertrophic Cardiomyopathy - Septal Variant - No gradient  - suspicion of Fabry's/Danon or other mimics of HCM: low - Gene variant: Pending  -  NYHA II - Non HCM Contributors to disease/status - Morbid obesity, will get A1C and consider GLP1-RA in the future - has chest pain and aortic atherosclerosis, reviewed 2022 CT Abdomen, septal thickness 22 on that assessment; will get CCTA and LDL direct; if no CP will get  stress echo for inducible gradient eval   Family history reviewed, Discussed family screening  (Given 3 family member including daugther passing for HF NOS, would get autopsy report; also recommend genetic screening for her son and grand daugther who may come to genetic visit)  SCD  Assessment - will assume family history for now - has 1st HB will get two week non live ziopatch; based on CCTA may get CMR or defer to 2025.  Atrial fibrillation (none noted) - Atrial arrhythmia management: monitor  Medication symptom plan - continue dual AV nodal therapy for now; if no CAD and has inducible gradient, will bring back to discuss options.  Time Spent Directly with Patient:   I have spent a total of 60 minutes with the patient reviewing notes, imaging, EKGs, labs and examining the patient as well as establishing an assessment and plan that was discussed personally with the patient.  > 50% of time was spent in direct patient care and reviewing imaging with patient .        Medication Adjustments/Labs and Tests Ordered: Current medicines are reviewed at length with the patient today.  Concerns regarding medicines are outlined above.  Orders Placed This Encounter  Procedures   CT CORONARY MORPH W/CTA COR W/SCORE W/CA W/CM &/OR WO/CM   Basic metabolic panel   Hemoglobin A1c   LDL cholesterol, direct   LONG TERM MONITOR (3-14 DAYS)   Meds ordered this encounter  Medications   metoprolol tartrate (LOPRESSOR) 25 MG tablet    Sig: Take 1 tablet (25 mg total) by mouth once for 1 dose. Take 2 hours prior to Cardiac CT    Dispense:  1 tablet    Refill:  0    Patient Instructions  Medication Instructions:  Your physician recommends that you continue on your current medications as directed. Please refer to the Current Medication list given to you today.  *If you need a refill on your cardiac medications before your next appointment, please call your pharmacy*   Lab Work: BMP, A1C, LDL  Direct  If you have labs (blood work) drawn today and your tests are completely normal, you will receive your results only by: MyChart Message (if you have MyChart) OR A paper copy in the mail If you have any lab test that is abnormal or we need to change your treatment, we will call you to review the results.   Testing/Procedures: Your physician has requested that you wear a heart monitor.   Your physician has requested that you have cardiac CT. Cardiac computed tomography (CT) is a painless test that uses an x-ray machine to take clear, detailed pictures of your heart. For further information please visit [email or web address] Please follow instruction sheet as given.     Follow-Up: At [facility], you and your health needs are our priority.  As part of our continuing mission to provide you with exceptional heart care, we have created designated Provider Care Teams.  These Care Teams include your primary Cardiologist (physician) and Advanced Practice Providers (APPs -  Physician Assistants and Nurse Practitioners) who all work together to provide you with the care you need, when you need it.  We recommend signing up for the patient  portal called "MyChart".  Sign up information is provided on this After Visit Summary.  MyChart is used to connect with patients for Virtual Visits (Telemedicine).  Patients are able to view lab/test results, encounter notes, upcoming appointments, etc.  Non-urgent messages can be sent to your provider as well.   To learn more about what you can do with MyChart, go to [email or web address].    Your next appointment:   4 month(s)  Provider:   Riley Lam, MD   Other Instructions  Your cardiac CT will be scheduled at one of the below locations:   [facility] [address] [place], [place] [address] [phone]  OR  [facility] [address] [place], [place]  [address] [phone]  OR   [facility] [address] [place], [place] [address] [phone]  If scheduled at [facility], please arrive at the [facility] and Children's Entrance (Entrance C2) of [facility] 30 minutes prior to test start time. You can use the FREE valet parking offered at entrance C (encouraged to control the heart rate for the test)  Proceed to the [facility] Radiology Department (first floor) to check-in and test prep.  All radiology patients and guests should use entrance C2 at [facility], accessed from [facility], even though the hospital's physical address listed is [address]    If scheduled at [facility] or [facility], please arrive 15 mins early for check-in and test prep.  There is spacious parking and easy access to the radiology department from the [facility] Heart and Vascular entrance. Please enter here and check-in with the desk attendant.   Please follow these instructions carefully (unless otherwise directed):  An IV will be required for this test and Nitroglycerin will be given.  Hold all erectile dysfunction medications at least 3 days (72 hrs) prior to test. (Ie viagra, cialis, sildenafil, tadalafil, etc)   On the Night Before the Test: Be sure to Drink plenty of water. Do not consume any caffeinated/decaffeinated beverages or chocolate 12 hours prior to your test. Do not take any antihistamines 12 hours prior to your test.  On the Day of the Test: Drink plenty of water until 1 hour prior to the test. Do not eat any food 1 hour prior to test. You may take your regular medications prior to the test.  Take metoprolol (Lopressor) 25 mg by mouth two hours prior to test. If you take Furosemide/Hydrochlorothiazide/Spironolactone, please HOLD on the morning of the test. FEMALES- please wear underwire-free bra if available, avoid  dresses & tight clothing        After the Test: Drink plenty of water. After receiving IV contrast, you may experience a mild flushed feeling. This is normal. On occasion, you may experience a mild rash up to 24 hours after the test. This is not dangerous. If this occurs, you can take Benadryl 25 mg and increase your fluid intake. If you experience trouble breathing, this can be serious. If it is severe call 911 IMMEDIATELY. If it is mild, please call our office. If you take any of these medications: Glipizide/Metformin, Avandament, Glucavance, please do not take 48 hours after completing test unless otherwise instructed.  We will call to schedule your test 2-4 weeks out understanding that some insurance companies will need an authorization prior to the service being performed.   For more information and frequently asked questions, please  visit our website : [email or web address]  For non-scheduling related questions, please contact the cardiac imaging nurse navigator should you have any questions/concerns: Cardiac Imaging Nurse Navigators Direct Office Dial: [phone]   For scheduling needs, including cancellations and rescheduling, please call [place], [phone].     ZIO XT- Long Term Monitor Instructions  Your physician has requested you wear a ZIO patch monitor for 14 days.  This is a single patch monitor. [employer] supplies one patch monitor per enrollment. Additional stickers are not available. Please do not apply patch if you will be having a Nuclear Stress Test,   Cardiac CT, MRI, or Chest Xray during the period you would be wearing the  monitor. The patch cannot be worn during these tests. You cannot remove and re-apply the  ZIO XT patch monitor.  Your ZIO patch monitor will be mailed 3 day USPS to your address on file. It may take 3-5 days  to receive your monitor after you have been enrolled.  Once you have received your monitor, please review the enclosed  instructions. Your monitor  has already been registered assigning a specific monitor serial # to you.  Billing and Patient Assistance Program Information  We have supplied [employer] with any of your insurance information on file for billing purposes. [employer] offers a sliding scale Patient Assistance Program for patients that do not have  insurance, or whose insurance does not completely cover the cost of the ZIO monitor.  You must apply for the Patient Assistance Program to qualify for this discounted rate.  To apply, please call [employer] at [phone], select option 4, select option 2, ask to apply for  Patient Assistance Program. Meredeth Ide will ask your household income, and how many people  are in your household. They will quote your out-of-pocket cost based on that information.  [employer] will also be able to set up a 48-month, interest-free payment plan if needed.  Applying the monitor   Shave hair from upper left chest.  Hold abrader disc by orange tab. Rub abrader in 40 strokes over the upper left chest as  indicated in your monitor instructions.  Clean area with 4 enclosed alcohol pads. Let dry.  Apply patch as indicated in monitor instructions. Patch will be placed under collarbone on left  side of chest with arrow pointing upward.  Rub patch adhesive wings for 2 minutes. Remove white label marked "1". Remove the white  label marked "2". Rub patch adhesive wings for 2 additional minutes.  While looking in a mirror, press and release button in center of patch. A small green light will  flash 3-4 times. This will be your only indicator that the monitor has been turned on.  Do not shower for the first 24 hours. You may shower after the first 24 hours.  Press the button if you feel a symptom. You will hear a small click. Record Date, Time and  Symptom in the Patient Logbook.  When you are ready to remove the patch, follow instructions on the last 2 pages of Patient  Logbook. Stick patch  monitor onto the last page of Patient Logbook.  Place Patient Logbook in the blue and white box. Use locking tab on box and tape box closed  securely. The blue and white box has prepaid postage on it. Please place it in the mailbox as  soon as possible. Your physician should have your test results approximately 7 days after the  monitor has been mailed back to [facility].  Call [employer]  [facility] at [phone] if you have questions regarding  your ZIO XT patch monitor. Call them immediately if you see an orange light blinking on your  monitor.  If your monitor falls off in less than 4 days, contact our Monitor department at [phone].  If your monitor becomes loose or falls off after 4 days call [employer] at [phone] for  suggestions on securing your monitor    Signed, Christell Constant, MD  01/11/2023 11:00 AM    [facility]

## 2023-01-14 NOTE — Progress Notes (Signed)
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 She had an abnormal echocardiogram in 2019 that detected severe asymmetric septal hypertrophy with LVEF of 65-70%.   She reports having dyspnea, fatigue and chest discomfort but denies syncope, dizziness or heart palpitations. She says that her shortness of breath gets worse when she sings.  Family history  Relation to Proband Pedigree # Current age Heart condition/age of onset Notes  Daughter IV.9 Deceased Unaware Died of heart failure- acute onset, @ 46  Son IV.10 47 None Echo/EKG not done  Grandkids V.1, V.2 V.1-Deceased V.2- 26  V.1- died of congenital heart defect at 100        Brothers, 2 III.3, II.7 III.3-Deceased III.7- 63 None III.3- Died @ 74 from COPD  Sisters, 4 III.1, III.2, III.4, III.6 III.6- 67 Others-deceased   III.1- Died of diabetes complications @ 47 III.2- Died of CHF @ 65 III.4- Died of HF @ 47 III.6- autisitc  Nephews, nieces IV.1-IV.8, IV.11 unaware unaware         Father II.1 Deceased None Died @ 56- cancer  Mother II.2 Deceased None Died of diabetes complications @ 57    Genetics Traci Chandler was counseled on the genetics of hypertrophic cardiomyopathy (HCM). I explained to the patient that this is an autosomal dominant condition with incomplete penetrance i.e. not all individuals harboring the HCM mutation will present clinically with HCM, and age-related penetrance where clinical presentation of HCM increases with advanced age. Variability in clinical expression is also seen in families with HCM with affected family members presenting clinically at different ages and with symptoms ranging from mild to severe.  Since HCM is an  autosomal dominant condition, first degree-relatives should seek regular surveillance for HCM.  First-degree relatives, include her son. Since her daughter died of HD at a very young age of 71, her 46 y.o daughter should also get screened. Clinical screening of first-degree relatives involves echocardiogram and EKG at regular intervals, frequency is typically determined by age, with those in their teens undergoing screening every year until the age of 35 and those over the age of 63 getting screened every 3-5 years. Patient verbalized understanding of this.  I informed the patient that about 8-10% of HCM patients can have compound and digenic mutations for HCM. Also briefly discussed the inheritance pattern and treatment /management plans for the infiltrative cardiomyopathies that present as HCM phenocopies.   Genetic testing is a probabilistic test dependent upon age and severity of presentation, presence of risk factors for HCM and importantly family history of HCM or sudden death in first-degree relatives.   If a mutation is not identified, then all first-degree relatives should undergo regular screening for HCM. I emphasized that even if the genetic test is negative, it does not mean that he does not have HCM. A negative test result can be due to limitations of the genetic test.   There is also the likelihood of identifying a "Variant of unknown significance". This result means that the variant has not been detected in a statistically significant number of HCM patients and/or functional studies have not been performed to verify its pathogenicity. This VUS can be tested in the family to see if it segregates with disease. If a VUS is  found, first-degree relatives should undergo regular clinical screening for HCM.  If a pathogenic variant is reported, then first-degree family members can get tested for this variant. If they test positive, it is likely they will develop HCM. In light of variable expression  and incomplete penetrance associated with HCM, it is not possible to predict when they will manifest clinically with HCM. It is recommended that family members that test positive for the familial pathogenic variant pursue clinical screening for HCM. Family members that test negative for the familial mutation need not pursue periodic screening for HCM, but seek care if symptoms develop.   Impression  Traci Chandler was found to have cardiac wall thickness suggestive of HCM at age 61 in the absence of other loading conditions such as uncontrolled HTN that can cause cardiac hypertrophy. There are no reports of HCM or sudden death amongst her siblings but she reports a dramatic family history of death from heart failure at a young age in her daughter and two older sisters,   Genetic testing for HCM is recommended. This test should include the sarcomeric genes, namely MYBPC3, MYH7, TNNI3, TNNT2, TPM1, ACTC1, MYL2, MYL3 implicated in HCM as well as the genes for the HCM phenocopies, such as Fabry disease (GLA), Danon disease (LAMP2), WPW syndrome (PRKAG2) and familial transthyretin amyloidosis (TTR) and phospholamban (PLN)  In addition, we discussed the protections afforded by the Genetic Information Non-Discrimination Act (GINA). I explained to the patient that GINA protects them from losing their employment or health insurance based on their genotype. However, these protections do not cover life insurance and disability. Patient verbalized understanding of this.  Please note that the patient has not been counseled in this visit on other personal, cultural, or ethical issues that they may face due to their heart condition.   Plan After a thorough discussion of the risk and benefits of genetic testing for HCM, Traci Chandler declines HCM genetic testing due to insurance non-coverage. She will inform her son and grandchild to undergo regular screening for HCM.    Sidney Ace, Ph.D, [facility] Clinical Molecular Geneticist

## 2023-01-16 ENCOUNTER — Telehealth: Payer: Self-pay

## 2023-01-16 MED ORDER — ROSUVASTATIN CALCIUM 5 MG PO TABS: 5.0000 mg | ORAL_TABLET | Freq: Every day | ORAL | 3 refills | Status: DC

## 2023-01-16 NOTE — Telephone Encounter (Signed)
The patient has been notified of the result and verbalized understanding.  All questions (if any) were answered. Macie Burows, RN 01/16/2023 4:11 PM   Pt will have repeat labs drawn at f/u visit on 05/09/23 at 11:40 am.  Reset my chart password per pt request.  Advised on some dietary changes to help lower LDL.  Also advised to look up the Mediterranean diet. All questions answered.

## 2023-01-18 ENCOUNTER — Encounter (HOSPITAL_COMMUNITY): Payer: Self-pay

## 2023-01-18 ENCOUNTER — Telehealth (HOSPITAL_COMMUNITY): Payer: Self-pay | Admitting: *Deleted

## 2023-01-18 NOTE — Telephone Encounter (Signed)
Reaching out to patient to offer assistance regarding upcoming cardiac imaging study; pt verbalizes understanding of appt date/time, parking situation and where to check in, pre-test NPO status and medications ordered, and verified current allergies; name and call back number provided for further questions should they arise  Larey Brick RN Navigator Cardiac Imaging [facility] [phone] office [phone] cell  Patient to take 25mg  metoprolol tartrate two hours prior to her cardiac CT scan. She is aware to arrive at 10:30 AM.

## 2023-01-21 ENCOUNTER — Ambulatory Visit (HOSPITAL_COMMUNITY)
Admission: RE | Admit: 2023-01-21 | Discharge: 2023-01-21 | Disposition: A | Discharge status: Home / Self Care | Payer: Medicare Other | Source: Ambulatory Visit | Attending: Internal Medicine | Admitting: Internal Medicine

## 2023-01-21 DIAGNOSIS — R072 Precordial pain: Secondary | ICD-10-CM | POA: Diagnosis not present

## 2023-01-21 MED ORDER — NITROGLYCERIN 0.4 MG SL SUBL
SUBLINGUAL_TABLET | SUBLINGUAL | Status: AC
  Filled 2023-01-21: qty 2

## 2023-01-21 MED ORDER — IOHEXOL 350 MG/ML SOLN
95.0000 mL | Freq: Once | INTRAVENOUS | Status: AC | PRN
  Administered 2023-01-21: 95 mL via INTRAVENOUS

## 2023-01-21 MED ORDER — NITROGLYCERIN 0.4 MG SL SUBL
0.8000 mg | SUBLINGUAL_TABLET | Freq: Once | SUBLINGUAL | Status: AC
  Administered 2023-01-21: 0.8 mg via SUBLINGUAL

## 2023-01-21 NOTE — Progress Notes (Signed)
IV started 20g L AC Attempt x 1 Pt tolerated well  Post procedure vitals BP 146/70 HR 62  IV removed Bleeding controlled Catheter intact

## 2023-01-28 DIAGNOSIS — Z79899 Other long term (current) drug therapy: Secondary | ICD-10-CM | POA: Diagnosis not present

## 2023-01-28 DIAGNOSIS — R946 Abnormal results of thyroid function studies: Secondary | ICD-10-CM | POA: Diagnosis not present

## 2023-01-28 DIAGNOSIS — Z13220 Encounter for screening for lipoid disorders: Secondary | ICD-10-CM | POA: Diagnosis not present

## 2023-01-28 DIAGNOSIS — R7309 Other abnormal glucose: Secondary | ICD-10-CM | POA: Diagnosis not present

## 2023-01-29 ENCOUNTER — Telehealth: Payer: Self-pay

## 2023-01-29 DIAGNOSIS — I440 Atrioventricular block, first degree: Secondary | ICD-10-CM

## 2023-01-29 DIAGNOSIS — E782 Mixed hyperlipidemia: Secondary | ICD-10-CM

## 2023-01-29 DIAGNOSIS — I422 Other hypertrophic cardiomyopathy: Secondary | ICD-10-CM | POA: Diagnosis not present

## 2023-01-29 DIAGNOSIS — I700 Atherosclerosis of aorta: Secondary | ICD-10-CM

## 2023-01-29 DIAGNOSIS — R072 Precordial pain: Secondary | ICD-10-CM

## 2023-01-29 NOTE — Telephone Encounter (Signed)
MD would like stress echo in [place].  Called pt advised of this information.  Pt is okay with having testing in [place].  Provided address and instructions for testing placed instructions on my chart for pt to view.

## 2023-01-29 NOTE — Telephone Encounter (Signed)
-----   Message from Christell Constant sent at 01/21/2023  2:55 PM EDT ----- Results: Minimal non obstructive CAD Severe Basal Septal hypertrophy and Chordal SAM Notable pulmonary artery dilation  Plan: Stress echo for inducible LVOT gradient  Christell Constant, MD

## 2023-01-29 NOTE — Telephone Encounter (Signed)
Called pt advised of MD recommendation for stress echo.  Pt wants to know if MD is okay with having test performed in [place].  Will come to [facility] if MD feels she should come here.

## 2023-02-04 DIAGNOSIS — I422 Other hypertrophic cardiomyopathy: Secondary | ICD-10-CM | POA: Diagnosis not present

## 2023-02-04 DIAGNOSIS — R748 Abnormal levels of other serum enzymes: Secondary | ICD-10-CM | POA: Diagnosis not present

## 2023-02-04 DIAGNOSIS — Z0000 Encounter for general adult medical examination without abnormal findings: Secondary | ICD-10-CM | POA: Diagnosis not present

## 2023-02-04 DIAGNOSIS — N1832 Chronic kidney disease, stage 3b: Secondary | ICD-10-CM | POA: Diagnosis not present

## 2023-02-04 DIAGNOSIS — I129 Hypertensive chronic kidney disease with stage 1 through stage 4 chronic kidney disease, or unspecified chronic kidney disease: Secondary | ICD-10-CM | POA: Diagnosis not present

## 2023-02-20 DIAGNOSIS — I422 Other hypertrophic cardiomyopathy: Secondary | ICD-10-CM | POA: Diagnosis not present

## 2023-02-20 DIAGNOSIS — R072 Precordial pain: Secondary | ICD-10-CM | POA: Diagnosis not present

## 2023-02-28 ENCOUNTER — Telehealth (HOSPITAL_COMMUNITY): Payer: Self-pay | Admitting: *Deleted

## 2023-02-28 DIAGNOSIS — R748 Abnormal levels of other serum enzymes: Secondary | ICD-10-CM | POA: Diagnosis not present

## 2023-02-28 NOTE — Telephone Encounter (Signed)
Spoke with patient and she was given  instructions about her STRESS ECHO TEST ON 03/01/23 at 2:30.

## 2023-03-01 ENCOUNTER — Ambulatory Visit (HOSPITAL_BASED_OUTPATIENT_CLINIC_OR_DEPARTMENT_OTHER): Payer: Medicare Other

## 2023-03-01 ENCOUNTER — Ambulatory Visit (HOSPITAL_COMMUNITY): Payer: Medicare Other | Attending: Internal Medicine

## 2023-03-01 DIAGNOSIS — R072 Precordial pain: Secondary | ICD-10-CM | POA: Diagnosis not present

## 2023-03-01 DIAGNOSIS — I422 Other hypertrophic cardiomyopathy: Secondary | ICD-10-CM | POA: Insufficient documentation

## 2023-03-01 LAB — ECHOCARDIOGRAM STRESS TEST
Area-P 1/2: 4.36 cm2
S' Lateral: 3 cm

## 2023-03-06 ENCOUNTER — Encounter: Payer: Self-pay | Admitting: *Deleted

## 2023-03-27 ENCOUNTER — Ambulatory Visit: Payer: Medicare Other | Admitting: Podiatry

## 2023-03-29 ENCOUNTER — Ambulatory Visit: Payer: Medicare Other

## 2023-03-29 ENCOUNTER — Ambulatory Visit
Admission: RE | Admit: 2023-03-29 | Discharge: 2023-03-29 | Disposition: A | Discharge status: Home / Self Care | Payer: Medicare Other | Source: Ambulatory Visit | Attending: Nurse Practitioner | Admitting: Nurse Practitioner

## 2023-03-29 VITALS — BP 154/83 | HR 71 | Temp 97.9°F | Resp 16

## 2023-03-29 DIAGNOSIS — M79671 Pain in right foot: Secondary | ICD-10-CM | POA: Diagnosis not present

## 2023-03-29 DIAGNOSIS — M21611 Bunion of right foot: Secondary | ICD-10-CM | POA: Diagnosis not present

## 2023-03-29 DIAGNOSIS — R2241 Localized swelling, mass and lump, right lower limb: Secondary | ICD-10-CM | POA: Diagnosis not present

## 2023-03-29 DIAGNOSIS — Z8739 Personal history of other diseases of the musculoskeletal system and connective tissue: Secondary | ICD-10-CM | POA: Diagnosis not present

## 2023-03-29 DIAGNOSIS — M2011 Hallux valgus (acquired), right foot: Secondary | ICD-10-CM | POA: Diagnosis not present

## 2023-03-29 DIAGNOSIS — Z87312 Personal history of (healed) stress fracture: Secondary | ICD-10-CM | POA: Diagnosis not present

## 2023-03-29 DIAGNOSIS — M84374A Stress fracture, right foot, initial encounter for fracture: Secondary | ICD-10-CM | POA: Diagnosis not present

## 2023-03-29 DIAGNOSIS — R600 Localized edema: Secondary | ICD-10-CM | POA: Diagnosis not present

## 2023-03-29 MED ORDER — PREDNISONE 50 MG PO TABS: ORAL_TABLET | ORAL | 0 refills | Status: DC

## 2023-03-29 MED ORDER — DEXAMETHASONE SODIUM PHOSPHATE 10 MG/ML IJ SOLN
10.0000 mg | INTRAMUSCULAR | Status: AC
  Administered 2023-03-29: 10 mg via INTRAMUSCULAR

## 2023-03-29 MED ORDER — KETOROLAC TROMETHAMINE 15 MG/ML IJ SOLN
15.0000 mg | Freq: Once | INTRAMUSCULAR | Status: AC
  Administered 2023-03-29: 15 mg via INTRAMUSCULAR

## 2023-03-29 MED ORDER — KETOROLAC TROMETHAMINE 30 MG/ML IJ SOLN: 15.0000 mg | Freq: Once | INTRAMUSCULAR | Status: DC

## 2023-03-29 NOTE — ED Triage Notes (Signed)
Pt reports she has right foot pain and throbbing x 5 days.     Took 67 year old prednisone and amoxicillin

## 2023-03-29 NOTE — Discharge Instructions (Addendum)
X-ray of your right foot is pending.  You will be contacted if the x-ray result is abnormal.  Blood work is also pending.  You will also be contacted if those results are abnormal.  You will also have access to your results via MyChart. Begin prednisone on 03/30/2023. May take over-the-counter Tylenol as needed for pain or discomfort. RICE therapy, rest, ice, compression, and elevation.  Apply ice for 20 minutes, remove for 1 hour, repeat as needed. You have been provided a postop shoe to provide compression and support of the right foot, wear when you are engaged in prolonged or strenuous activity. If symptoms do not improve with this treatment, it is recommended that you follow-up with orthopedics or with your primary care physician for further evaluation. Follow-up as needed.

## 2023-03-29 NOTE — ED Provider Notes (Signed)
[facility]    CSN: [id number] Arrival date & time: 03/29/23  0944      History   Chief Complaint Chief Complaint  Patient presents with   Foot Pain    Entered by patient    HPI Traci Chandler is a 67 y.o. female.   The history is provided by the patient.   Patient presents for complaints of right foot pain is been present for the past week.  Patient denies injury or trauma, numbness, tingling, radiation of pain, or inability to bear weight.  Patient also endorses swelling, and pain with ambulation.  Patient reports that she has been treated for gout in the past, and has also been informed that she had a stress fracture in the same foot.  She reports that she did eat some red meat prior to her symptoms starting.  She reports she had 2 prednisone that she took from a leftover prescription and an amoxicillin capsule.  Reports that she did experience some relief after taking the medication.  Past Medical History:  Diagnosis Date   Essential hypertension    GERD (gastroesophageal reflux disease)    Hypertrophic non-obstructive cardiomyopathy (HCC)    Echocardiogram 2011    Patient Active Problem List   Diagnosis Date Noted   Precordial pain 01/11/2023   Morbid obesity (HCC) 01/11/2023   Aortic atherosclerosis (HCC) 01/11/2023   Mixed hyperlipidemia 01/11/2023   1st degree AV block 01/11/2023   Empyema of gallbladder 03/06/2021   Biliary colic 03/02/2021   Calculus of gallbladder without cholecystitis without obstruction    GERD (gastroesophageal reflux disease) 01/14/2018   Abdominal pain 01/14/2018   Encounter for screening colonoscopy 04/02/2013   Hypertrophic cardiomyopathy (HCC) 09/12/2011    Past Surgical History:  Procedure Laterality Date   ABDOMINAL HYSTERECTOMY     CHOLECYSTECTOMY N/A 03/07/2021   Procedure: LAPAROSCOPIC CHOLECYSTECTOMY;  Surgeon: [name], MD;  Location: [facility];  Service: General;  Laterality: N/A;   COLONOSCOPY N/A  04/15/2013   Procedure: COLONOSCOPY;  Surgeon: West Bali, MD;  Location: [facility];  Service: Endoscopy;  Laterality: N/A;  9:30    OB History   No obstetric history on file.      Home Medications    Prior to Admission medications   Medication Sig Start Date End Date Taking? Authorizing Provider  predniSONE (DELTASONE) 50 MG tablet Take 1 tablet daily with breakfast for the next 5 days. 03/29/23  Yes Feliberto Stockley-Warren, Sadie Haber, NP  aspirin 81 MG tablet Take 81 mg by mouth every evening.     [provider]  atenolol (TENORMIN) 50 MG tablet Take 1 tablet by mouth daily. 02/11/18   [provider]  metoprolol tartrate (LOPRESSOR) 25 MG tablet Take 1 tablet (25 mg total) by mouth once for 1 dose. Take 2 hours prior to Cardiac CT 01/11/23 01/11/23  Riley Lam A, MD  NIFEdipine (PROCARDIA XL/NIFEDICAL XL) 60 MG 24 hr tablet Take 60 mg by mouth daily. 07/09/19   [provider]  omeprazole (PRILOSEC) 20 MG capsule TAKE 1 CAPSULE 2 TIMES A DAY BEFORE A MEAL. 08/10/19   Anice Paganini, NP  polyethylene glycol (MIRALAX / GLYCOLAX) 17 g packet Take 17 g by mouth daily.    [provider]  rosuvastatin (CRESTOR) 5 MG tablet Take 1 tablet (5 mg total) by mouth daily. 01/16/23   Christell Constant, MD  spironolactone (ALDACTONE) 25 MG tablet Take 25 mg by mouth 2 (two) times daily.  [provider]  traMADol (ULTRAM) 50 MG tablet as needed for severe pain. 07/02/19   [provider]  triamcinolone cream (KENALOG) 0.1 % as needed (rash). 01/24/21   [provider]    Family History Family History  Problem Relation Age of Onset   Diabetes type II Mother    Hypertension Mother    Diabetes type II Sister    Hypertension Sister    Prostate cancer Father    Colon cancer Neg Hx     Social History Social History   Tobacco Use   Smoking status: Never   Smokeless tobacco: Never  Vaping Use   Vaping status: Never Used   Substance Use Topics   Alcohol use: No   Drug use: No     Allergies   Codeine, Diclofenac, and Lisinopril   Review of Systems Review of Systems Per HPI  Physical Exam Triage Vital Signs ED Triage Vitals  Encounter Vitals Group     BP 03/29/23 0959 (!) 154/83     Systolic BP Percentile --      Diastolic BP Percentile --      Pulse Rate 03/29/23 0959 71     Resp 03/29/23 0959 16     Temp 03/29/23 0959 97.9 F (36.6 C)     Temp Source 03/29/23 0959 Oral     SpO2 03/29/23 0959 97 %     Weight --      Height --      Head Circumference --      Peak Flow --      Pain Score 03/29/23 0957 8     Pain Loc --      Pain Education --      Exclude from Growth Chart --    No data found.  Updated Vital Signs BP (!) 154/83 (BP Location: Right Arm)   Pulse 71   Temp 97.9 F (36.6 C) (Oral)   Resp 16   SpO2 97%   Visual Acuity Right Eye Distance:   Left Eye Distance:   Bilateral Distance:    Right Eye Near:   Left Eye Near:    Bilateral Near:     Physical Exam Vitals and nursing note reviewed.  Constitutional:      General: She is not in acute distress.    Appearance: Normal appearance.  HENT:     Head: Normocephalic.  Eyes:     Extraocular Movements: Extraocular movements intact.     Pupils: Pupils are equal, round, and reactive to light.  Musculoskeletal:     Right foot: Decreased range of motion (d/t pain). Normal capillary refill. Swelling and tenderness (Tenderness in all planes of the foot.) present. No deformity. Normal pulse.  Skin:    General: Skin is warm and dry.  Neurological:     General: No focal deficit present.     Mental Status: She is alert and oriented to person, place, and time.  Psychiatric:        Mood and Affect: Mood normal.        Behavior: Behavior normal.      UC Treatments / Results  Labs (all labs ordered are listed, but only abnormal results are displayed) Labs Reviewed  URIC ACID    EKG   Radiology No results  found.  Procedures Procedures (including critical care time)  Medications Ordered in UC Medications  dexamethasone (DECADRON) injection 10 mg (10 mg Intramuscular Given 03/29/23 1044)  ketorolac (TORADOL) 15 MG/ML injection 15 mg (15 mg  Intramuscular Given 03/29/23 1044)    Initial Impression / Assessment and Plan / UC Course  I have reviewed the triage vital signs and the nursing notes.  Pertinent labs & imaging results that were available during my care of the patient were reviewed by me and considered in my medical decision making (see chart for details).  The patient is well-appearing, she is in no acute distress, she is hypertensive, but vital signs are otherwise stable.  X-ray of the right foot is pending, uric acid was also collected today.  Differential diagnoses include stress fracture, gout, or lower extremity edema.  Toradol 15 mg IM and Decadron 10 mg IM administered.  Postop shoe was provided to provide the patient with additional compression and support will start patient on prednisone 50 mg for the next 5 days.  Supportive care recommendations were provided and discussed with the patient to include use of over-the-counter analgesics such as Tylenol for pain, and RICE therapy.  Patient was advised if symptoms fail to improve, it is recommended that she follow-up with her primary care physician or with orthopedics for further evaluation.  Patient is in agreement with this plan of care and verbalizes understanding.  All questions were answered.  Patient stable for discharge.  Final Clinical Impressions(s) / UC Diagnoses   Final diagnoses:  Localized swelling of right foot  Pain in right foot  History of gout  History of stress fracture     Discharge Instructions      X-ray of your right foot is pending.  You will be contacted if the x-ray result is abnormal.  Blood work is also pending.  You will also be contacted if those results are abnormal.  You will also have access  to your results via MyChart. Begin prednisone on 03/30/2023. May take over-the-counter Tylenol as needed for pain or discomfort. RICE therapy, rest, ice, compression, and elevation.  Apply ice for 20 minutes, remove for 1 hour, repeat as needed. You have been provided a postop shoe to provide compression and support of the right foot, wear when you are engaged in prolonged or strenuous activity. If symptoms do not improve with this treatment, it is recommended that you follow-up with orthopedics or with your primary care physician for further evaluation. Follow-up as needed.     ED Prescriptions     Medication Sig Dispense Auth. Provider   predniSONE (DELTASONE) 50 MG tablet Take 1 tablet daily with breakfast for the next 5 days. 5 tablet Preciliano Castell-Warren, Sadie Haber, NP      PDMP not reviewed this encounter.   Abran Cantor, NP 03/29/23 1056

## 2023-03-30 LAB — URIC ACID: Uric Acid: 7.1 mg/dL (ref 3.0–7.2)

## 2023-04-05 ENCOUNTER — Other Ambulatory Visit (HOSPITAL_COMMUNITY): Payer: Self-pay | Admitting: Family Medicine

## 2023-04-05 DIAGNOSIS — Z1231 Encounter for screening mammogram for malignant neoplasm of breast: Secondary | ICD-10-CM

## 2023-05-06 ENCOUNTER — Ambulatory Visit (HOSPITAL_COMMUNITY)
Admission: RE | Admit: 2023-05-06 | Discharge: 2023-05-06 | Disposition: A | Discharge status: Home / Self Care | Payer: Medicare Other | Source: Ambulatory Visit | Attending: Family Medicine | Admitting: Family Medicine

## 2023-05-06 ENCOUNTER — Encounter (HOSPITAL_COMMUNITY): Payer: Self-pay

## 2023-05-06 DIAGNOSIS — Z1231 Encounter for screening mammogram for malignant neoplasm of breast: Secondary | ICD-10-CM | POA: Diagnosis not present

## 2023-05-09 ENCOUNTER — Encounter: Payer: Self-pay | Admitting: Internal Medicine

## 2023-05-09 ENCOUNTER — Ambulatory Visit: Payer: Medicare Other | Attending: Internal Medicine | Admitting: Internal Medicine

## 2023-05-09 VITALS — BP 118/70 | HR 67 | Ht 67.0 in | Wt 249.2 lb

## 2023-05-09 DIAGNOSIS — I2510 Atherosclerotic heart disease of native coronary artery without angina pectoris: Secondary | ICD-10-CM | POA: Diagnosis not present

## 2023-05-09 DIAGNOSIS — E782 Mixed hyperlipidemia: Secondary | ICD-10-CM

## 2023-05-09 DIAGNOSIS — I421 Obstructive hypertrophic cardiomyopathy: Secondary | ICD-10-CM | POA: Diagnosis not present

## 2023-05-09 NOTE — Patient Instructions (Signed)
Medication Instructions:  Your physician recommends that you continue on your current medications as directed. Please refer to the Current Medication list given to you today.  *If you need a refill on your cardiac medications before your next appointment, please call your pharmacy*  Lab Work: Your physician recommends that you return for lab work in: liver panel and LDL Direct.  If you have labs (blood work) drawn today and your tests are completely normal, you will receive your results only by: MyChart Message (if you have MyChart) OR A paper copy in the mail If you have any lab test that is abnormal or we need to change your treatment, we will call you to review the results.  Testing/Procedures: None ordered today.  Follow-Up: At [facility], you and your health needs are our priority.  As part of our continuing mission to provide you with exceptional heart care, we have created designated Provider Care Teams.  These Care Teams include your primary Cardiologist (physician) and Advanced Practice Providers (APPs -  Physician Assistants and Nurse Practitioners) who all work together to provide you with the care you need, when you need it.  We recommend signing up for the patient portal called "MyChart".  Sign up information is provided on this After Visit Summary.  MyChart is used to connect with patients for Virtual Visits (Telemedicine).  Patients are able to view lab/test results, encounter notes, upcoming appointments, etc.  Non-urgent messages can be sent to your provider as well.   To learn more about what you can do with MyChart, go to [email or web address].    Your next appointment:   6 month(s)  Provider:   Dr. Izora Ribas

## 2023-05-09 NOTE — Progress Notes (Signed)
Cardiology Office Note:    Date:  05/09/2023   ID:  Traci Chandler, DOB May 16, 1956, MRN [id number]  PCP:  Irena Reichmann, DO   [facility] Providers Cardiologist:  Nona Dell, MD     Referring MD: Irena Reichmann, DO   CC: HCM follow up  History of Present Illness:    Traci Chandler is a 67 y.o. female with a hx of HTN, Morbid Obesity. For HCM follow up. 2024: Non obstructive disease.  Minimal CAD.  No high risk findings on testing.  Mild transaminitis with both illiness and statin.  Traci Chandler, a 67 year old individual with a history of nonobstructive hypertrophic cardiomyopathy, morbid obesity, likely obstructive sleep apnea, and evidence of pulmonary artery dilation, presents for a follow-up consultation. She reports feeling well overall, with no new or worsening symptoms. She denies experiencing any chest pain or breathing issues.  Previously, she underwent a stress echo, which showed no evidence of obstruction or atrial fibrillation. She also had minimal nonobstructive coronary artery disease. However, she experienced liver function abnormalities while on rosuvastatin, leading to its discontinuation.  She also reports a recent issue with her leg, initially thought to be a stress fracture but now suspected to be gout. This has caused her discomfort for about a month, but she reports it is now improving.  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  She has no clear family history of hypertrophic cardiomyopathy, with a sister who passed away from an unknown cause. Genetic testing was discussed but deferred by the patient due to concerns about insurance coverage.  The patient is active, caring for an autistic sister, and reports no limitations in her daily activities. She expresses a desire to maintain her current state of health and prevent any future complications.   Past Medical History:  Diagnosis Date   Essential hypertension    GERD  (gastroesophageal reflux disease)    Hypertrophic non-obstructive cardiomyopathy (HCC)    Echocardiogram 2011    Past Surgical History:  Procedure Laterality Date   ABDOMINAL HYSTERECTOMY     CHOLECYSTECTOMY N/A 03/07/2021   Procedure: LAPAROSCOPIC CHOLECYSTECTOMY;  Surgeon: Traci Roers, MD;  Location: [facility];  Service: General;  Laterality: N/A;   COLONOSCOPY N/A 04/15/2013   Procedure: COLONOSCOPY;  Surgeon: Traci Bali, MD;  Location: [facility];  Service: Endoscopy;  Laterality: N/A;  9:30    Current Medications: No outpatient medications have been marked as taking for the 05/09/23 encounter (Appointment) with Traci Constant, MD.     Allergies:   Codeine, Diclofenac, and Lisinopril   Social History   Socioeconomic History   Marital status: Married    Spouse name: Not on file   Number of children: Not on file   Years of education: Not on file   Highest education level: Not on file  Occupational History   Occupation: employed    Employer: [employer]    Comment: [employer]  Tobacco Use   Smoking status: Never   Smokeless tobacco: Never  Vaping Use   Vaping status: Never Used  Substance and Sexual Activity   Alcohol use: No   Drug use: No   Sexual activity: Yes    Birth control/protection: Surgical  Other Topics Concern   Not on file  Social History Narrative   Not on file   Social Determinants of Health   Financial Resource Strain: Not on file  Food Insecurity: Not on file  Transportation Needs: Not on file  Physical Activity: Not on file  Stress: Not on file  Social Connections: Not on file     Family History: The patient's family history includes Diabetes type II in her mother and sister; Hypertension in her mother and sister; Prostate cancer in her father. There is no history of Colon cancer.  ROS:   Please see the history of present illness.     EKGs/Labs/Other Studies Reviewed:    The following studies were  reviewed today:  Cardiac Studies & Procedures     STRESS TESTS  ECHOCARDIOGRAM STRESS TEST 03/01/2023  Narrative EXERCISE STRESS ECHO REPORT   --------------------------------------------------------------------------------  Patient Name:   Traci Chandler [facility] Date of Exam: 03/01/2023 Medical Rec #:  [id number]       Height:       67.0 in Accession #:    [id number]      Weight:       250.0 lb Date of Birth:  12-04-1955        BSA:          2.223 m Patient Age:    67 years        BP:           142/66 mmHg Patient Gender: F               HR:           68 bpm. Exam Location:  [facility]  Procedure: Limited Echo, Limited Color Doppler, Cardiac Doppler and Stress Echo  Indications:    Hypertrophic cardiomyopathy I42.2  History:        Patient has prior history of Echocardiogram examinations, most recent 11/05/2022.  Sonographer:    Traci Chandler RDCS Referring Phys: [id number] Traci Chandler   Sonographer Comments: [facility], Designated Party form signed 10/10/22 . Can leave detailed message on cell phone. Authorized to discuss medical info with [employer] daughter. IMPRESSIONS   1. 1.8 cm basal septum c/w HCM. 2. 6.7 METS exercise, no chest pain. 3. LVOT gradient increased from 7 mmHg to 15 mmHg with stress. 4. Mild AI. 5. This is an inconclusive stress echocardiogram for ischemia. 6. This is an indeterminate risk study.  FINDINGS  Exam Protocol: The patient exercised on a treadmill according to a Bruce protocol.   Patient Performance: The patient exercised for 4 minutes and 45 seconds, achieving 6 METS. The maximum stage achieved was II of the Bruce protocol. The heart rate at peak stress was 122 bpm. The target heart rate was calculated to be 128 bpm. The percentage of maximum predicted heart rate achieved was 80.9 %. The baseline blood pressure was 143/66 mmHg. The blood pressure at peak stress was 203/85 mmHg. The patient developed fatigue  during the stress exam.  EKG: Resting EKG showed normal sinus rhythm. The patient developed occasional premature ventricular contractions during exercise.   2D Echo Findings: The baseline ejection fraction was 70%. The peak ejection fraction at stress was 80%. Baseline regional wall motion abnormalities were not present. This is an inconclusive stress echocardiogram for ischemia.  Additional Findings: 1.8 cm basal septum c/w HCM. LVOT gradient increased from 7 mmHg to 15 mmHg with stress. Mild AI. 6.7 METS exercise, no chest pain.   Zoila Shutter MD Electronically signed on 03/01/2023 at 4:40:15 PM     Final   ECHOCARDIOGRAM  ECHOCARDIOGRAM COMPLETE 11/05/2022  Narrative ECHOCARDIOGRAM REPORT    Patient Name:   Traci Chandler [facility] Date of Exam: 11/05/2022  Medical Rec #:  [id number]       Height:       67.0 in Accession #:    [id number]      Weight:       251.6 lb Date of Birth:  [birth year]        BSA:          2.229 m Patient Age:    66 years        BP:           137/77 mmHg Patient Gender: F               HR:           67 bpm. Exam Location:  [facility]  Procedure: 2D Echo, Cardiac Doppler and Color Doppler  Indications:    Hypertrophic cardiomyopathy (HCC) [756433]  History:        Patient has prior history of Echocardiogram examinations, most recent 03/12/2018. Risk Factors:Hypertension. Hypertrophic non-obstructive cardiomyopathy (HCC) (From Hx).  Sonographer:    Celesta Gentile RCS Referring Phys: [phone] SAMUEL G MCDOWELL  IMPRESSIONS   1. Severe asymmetrical septal hypertrophy measuring 2 cm. There is mild SAM of the anterior MV leaflet with mildly turbulent LVOT flow but without significant gradient at rest or with Valsalva. Findings consistent with hypertrophic cardiomyopathy. . Left ventricular ejection fraction, by estimation, is 65 to 70%. The left ventricle has normal function. The left ventricle has no regional wall motion abnormalities. There is severe asymmetric  left ventricular hypertrophy of the septal segment. Left ventricular diastolic parameters are indeterminate. 2. Right ventricular systolic function is normal. The right ventricular size is normal. Tricuspid regurgitation signal is inadequate for assessing PA pressure. 3. Left atrial size was moderately dilated. 4. The mitral valve is normal in structure. Trivial mitral valve regurgitation. No evidence of mitral stenosis. 5. The aortic valve was not well visualized. Aortic valve regurgitation is mild. No aortic stenosis is present. 6. The inferior vena cava is normal in size with greater than 50% respiratory variability, suggesting right atrial pressure of 3 mmHg.  FINDINGS Left Ventricle: Severe asymmetrical septal hypertrophy measuring 2 cm. There is mild SAM of the anterior MV leaflet with mildly turbulent LVOT flow but without significant gradient at rest or with Valsalva. Findings consistent with hypertrophic cardiomyopathy. Left ventricular ejection fraction, by estimation, is 65 to 70%. The left ventricle has normal function. The left ventricle has no regional wall motion abnormalities. The left ventricular internal cavity size was normal in size. There is severe asymmetric left ventricular hypertrophy of the septal segment. Left ventricular diastolic parameters are indeterminate.  Right Ventricle: The right ventricular size is normal. Right vetricular wall thickness was not well visualized. Right ventricular systolic function is normal. Tricuspid regurgitation signal is inadequate for assessing PA pressure.  Left Atrium: Left atrial size was moderately dilated.  Right Atrium: Right atrial size was normal in size.  Pericardium: There is no evidence of pericardial effusion.  Mitral Valve: The mitral valve is normal in structure. Trivial mitral valve regurgitation. No evidence of mitral valve stenosis.  Tricuspid Valve: The tricuspid valve is normal in structure. Tricuspid valve  regurgitation is not demonstrated. No evidence of tricuspid stenosis.  Aortic Valve: The aortic valve was not well visualized. Aortic valve regurgitation is mild. Aortic regurgitation PHT measures 656 msec. No aortic stenosis is present. Aortic valve mean gradient measures 3.0 mmHg. Aortic valve peak gradient measures 6.7 mmHg. Aortic valve area, by VTI measures 3.66 cm.  Pulmonic Valve: The  pulmonic valve was not well visualized. Pulmonic valve regurgitation is mild. No evidence of pulmonic stenosis.  Aorta: The aortic root is normal in size and structure.  Venous: The inferior vena cava is normal in size with greater than 50% respiratory variability, suggesting right atrial pressure of 3 mmHg.  IAS/Shunts: No atrial level shunt detected by color flow Doppler.   LEFT VENTRICLE PLAX 2D LVIDd:         4.00 cm   Diastology LVIDs:         2.50 cm   LV e' medial:    4.57 cm/s LV PW:         1.50 cm   LV E/e' medial:  17.9 LV IVS:        2.00 cm   LV e' lateral:   7.29 cm/s LVOT diam:     2.20 cm   LV E/e' lateral: 11.2 LV SV:         128 LV SV Index:   57 LVOT Area:     3.80 cm   RIGHT VENTRICLE             IVC RV S prime:     13.90 cm/s  IVC diam: 1.95 cm TAPSE (M-mode): 2.9 cm  LEFT ATRIUM              Index        RIGHT ATRIUM           Index LA diam:        3.50 cm  1.57 cm/m   RA Area:     20.40 cm LA Vol (A2C):   79.2 ml  35.52 ml/m  RA Volume:   60.00 ml  26.91 ml/m LA Vol (A4C):   105.0 ml 47.10 ml/m LA Biplane Vol: 98.4 ml  44.14 ml/m AORTIC VALVE AV Area (Vmax):    4.36 cm AV Area (Vmean):   4.81 cm AV Area (VTI):     3.66 cm AV Vmax:           129.00 cm/s AV Vmean:          86.200 cm/s AV VTI:            0.350 m AV Peak Grad:      6.7 mmHg AV Mean Grad:      3.0 mmHg LVOT Vmax:         148.00 cm/s LVOT Vmean:        109.000 cm/s LVOT VTI:          0.337 m LVOT/AV VTI ratio: 0.96 AI PHT:            656 msec  AORTA Ao Root diam: 3.50 cm  MITRAL  VALVE MV Area (PHT): 4.15 cm    SHUNTS MV Decel Time: 183 msec    Systemic VTI:  0.34 m MV E velocity: 81.90 cm/s  Systemic Diam: 2.20 cm MV A velocity: 77.10 cm/s MV E/A ratio:  1.06  Dina Rich MD Electronically signed by Dina Rich MD Signature Date/Time: 11/05/2022/12:52:56 PM    Final    MONITORS  LONG TERM MONITOR (3-14 DAYS) 02/20/2023  Narrative   Patient had a minimum heart rate of 48 bpm, maximum heart rate of 171 bpm, and average heart rate of 66 bpm.   Predominant underlying rhythm was sinus rhythm.   Rare NSVT, fastest 171 bpm (four beats).   Rare asymptomatic SVT   Isolated PACs were rare (<1.0%).   Isolated PVCs were rare (<1.0%).  No triggered and diary events.  No malignant arrhythmias.   CT SCANS  CT CORONARY MORPH W/CTA COR W/SCORE 01/21/2023  Addendum 01/26/2023  8:26 AM ADDENDUM REPORT: 01/26/2023 08:23  EXAM: OVER-READ INTERPRETATION  PET-CT CHEST  The following report is an over-read performed by radiologist Dr. Leatha [facility] on 01/26/2023. This over-read does not include interpretation of cardiac or coronary anatomy or pathology. The cardiac CT interpretation by the cardiologist is to be attached.  COMPARISON:  None.  FINDINGS: Tiny hiatal hernia. No lymphadenopathy within the visualized mediastinum or hilar regions.  3 mm left lower lobe perifissural nodule identified on image 13/11.  Visualized portions of the upper abdomen are unremarkable.  No suspicious lytic or sclerotic osseous abnormality.  IMPRESSION: 1. 3 mm left lower lobe perifissural nodule. No follow-up needed if patient is low-risk.This recommendation follows the consensus statement: Guidelines for Management of Incidental Pulmonary Nodules Detected on CT Images: From the Fleischner Society 2017; Radiology 2017; 284:228-243. 2. Tiny hiatal hernia.   Electronically Signed By: Kennith Center M.D. On: 01/26/2023  08:23  Narrative CLINICAL DATA:  67 yo female with chest pain  EXAM: Cardiac/Coronary CTA  TECHNIQUE: A non-contrast, gated CT scan was obtained with axial slices of 3 mm through the heart for calcium scoring. Calcium scoring was performed using the Agatston method. A 120 kV prospective, gated, contrast cardiac scan was obtained. Gantry rotation speed was 250 msecs and collimation was 0.6 mm. Two sublingual nitroglycerin tablets (0.8 mg) were given. The 3D data set was reconstructed in 5% intervals of the 35-75% of the R-R cycle. Diastolic phases were analyzed on a dedicated workstation using MPR, MIP, and VRT modes. The patient received 95 cc of contrast.  FINDINGS: Image quality: Poor.  Noise artifact is: Limited.  Coronary Arteries:  Normal coronary origin.  Right dominance.  Left main: The left main is a large caliber vessel with a normal take off from the left coronary cusp that trifurcates into a LAD, LCX, and ramus intermedius. There is minimal (0-24) calcified plaque in the proximal vessel.  Left anterior descending artery: The LAD has minimal (0-24) calcified plaque at the takeoff of the diagonal. The LAD gives off 1 patent diagonal branch.  Ramus intermedius: Small, patent with no evidence of plaque or stenosis.  Left circumflex artery: The LCX is non-dominant and patent with no evidence of plaque or stenosis. The LCX gives off 4 patent obtuse marginal branches.  Right coronary artery: The RCA is dominant with normal take off from the right coronary cusp. There is no evidence of plaque or stenosis. The RCA terminates as a PDA and right posterolateral branch without evidence of plaque or stenosis.  Right Atrium: Right atrial size is within normal limits.  Right Ventricle: The right ventricular cavity is within normal limits.  Left Atrium: Dilated with no left atrial appendage filling defect.  Left Ventricle: The ventricular cavity size is within normal  limits. Proximal septal thickening (2.1 cm).  Pulmonary arteries: Dilated (46 mm).  Pulmonary veins: Normal pulmonary venous drainage.  Pericardium: Normal thickness without significant effusion or calcium present.  Cardiac valves: The aortic valve is trileaflet without significant calcification. The mitral valve is normal without significant calcification.  Aorta: Normal caliber with aortic atherosclerosis.  Extra-cardiac findings: See attached radiology report for non-cardiac structures.  IMPRESSION: 1. Coronary calcium score of 140. This was 29 percentile for age-, sex, and race-matched controls.  2. Total plaque volume 116 mm3 which is 39 percentile for age- and  sex-matched controls (calcified plaque 15 mm3; non-calcified plaque 101 mm3). TPV is (moderate).  3. Normal coronary origin with right dominance.  4. Minimal (0-24) plaque in the LM and LAD.  5. Aortic atherosclerosis.  6. Dilated pulmonary artery (46 mm) suggestive of pulmonary hypertension.  7. Proximal septal thickening (21 mm) c/w known history of HCM.  RECOMMENDATIONS: CAD-RADS 1: Minimal non-obstructive CAD (0-24%). Consider non-atherosclerotic causes of chest pain. Consider preventive therapy and risk factor modification.  Olga Millers, MD  Electronically Signed: By: Olga Millers M.D. On: 01/21/2023 13:58                Recent Labs: 01/11/2023: BUN 20; Creatinine, Ser 1.17; Potassium 4.2; Sodium 142  Recent Lipid Panel    Component Value Date/Time   CHOL  05/23/2010 0500    141        ATP III CLASSIFICATION:  <200     mg/dL   Desirable  425-956  mg/dL   Borderline High  >=387    mg/dL   High          TRIG 69 05/23/2010 0500   HDL 39 (L) 05/23/2010 0500   CHOLHDL 3.6 05/23/2010 0500   VLDL 14 05/23/2010 0500   LDLCALC  05/23/2010 0500    88        Total Cholesterol/HDL:CHD Risk Coronary Heart Disease Risk Table                     Men   Women  1/2 Average Risk   3.4    3.3  Average Risk       5.0   4.4  2 X Average Risk   9.6   7.1  3 X Average Risk  23.4   11.0        Use the calculated Patient Ratio above and the CHD Risk Table to determine the patient's CHD Risk.        ATP III CLASSIFICATION (LDL):  <100     mg/dL   Optimal  564-332  mg/dL   Near or Above                    Optimal  130-159  mg/dL   Borderline  951-884  mg/dL   High  >166     mg/dL   Very High   LDLDIRECT 91 01/11/2023 1136     Physical Exam:    VS:  There were no vitals taken for this visit.    Wt Readings from Last 3 Encounters:  01/11/23 250 lb (113.4 kg)  10/10/22 251 lb 9.6 oz (114.1 kg)  03/14/21 236 lb (107 kg)    GEN: NAD morbid obesity HEENT: Normal NECK: No JVD CARDIAC: RRR, systolic murmurs with standing, no rubs, gallops RESPIRATORY:  Clear to auscultation without rales, wheezing or rhonchi  ABDOMEN: Soft, non-tender, non-distended MUSCULOSKELETAL:  No edema; No deformity  SKIN: Warm and dry NEUROLOGIC:  Alert and oriented x 3 PSYCHIATRIC:  Normal affect   ASSESSMENT:    No diagnosis found.  PLAN:    Hypertrophic Cardiomyopathy - Septal Variant - No gradient  - suspicion of Fabry's/Danon or other mimics of HCM: low - Gene variant: Deferred - we discussed free testing through the [facility] program, After discussing that she would not be a candidate for gene therapy presently and her concerns about research more broadly, son will get echo screening and she will defer for now  - NYHA I - Non HCM  Contributors to disease/status - Morbid obesity weight is down from last visit  Family history reviewed, Discussed family screening  (Given 3 family member including daugther passing for HF NOS, would get autopsy report; also recommend genetic screening for her son and grand daugther who may come to genetic visit)  SCD  Assessment - will assume family history for now - 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 Previous transaminitis on rosuvastatin, likely due to a viral illness, resolved after discontinuation. Plan to recheck cholesterol and consider non-statin medications if necessary. Conservative approach due to not severely elevated cholesterol levels. - Order LDL direct and liver function tests - Consider Zetia based on cholesterol levels - Recheck cholesterol levels after the holidays  Time Spent Directly with Patient:   I have spent a total of 60 minutes with the patient reviewing notes, imaging, EKGs, labs and examining the patient as well as establishing an assessment and plan that was discussed personally with the patient.  Discussed potential genetics testing at length.        Medication Adjustments/Labs and Tests Ordered: Current medicines are reviewed at length with the patient today.  Concerns regarding medicines are outlined above.  No orders of the defined types were placed in this encounter.  No orders of the defined types were placed in this encounter.   There are no Patient Instructions on file for this visit.   Signed, Traci Constant, MD  05/09/2023 11:48 AM    [facility]

## 2023-05-10 LAB — HEPATIC FUNCTION PANEL
ALT: 13 [IU]/L (ref 0–32)
AST: 18 [IU]/L (ref 0–40)
Albumin: 4.1 g/dL (ref 3.9–4.9)
Alkaline Phosphatase: 66 [IU]/L (ref 44–121)
Bilirubin Total: 0.2 mg/dL (ref 0.0–1.2)
Bilirubin, Direct: 0.1 mg/dL (ref 0.00–0.40)
Total Protein: 6.9 g/dL (ref 6.0–8.5)

## 2023-05-10 LAB — LDL CHOLESTEROL, DIRECT: LDL Direct: 108 mg/dL — ABNORMAL HIGH (ref 0–99)

## 2023-05-13 ENCOUNTER — Telehealth: Payer: Self-pay

## 2023-05-13 DIAGNOSIS — I2510 Atherosclerotic heart disease of native coronary artery without angina pectoris: Secondary | ICD-10-CM

## 2023-05-13 DIAGNOSIS — E782 Mixed hyperlipidemia: Secondary | ICD-10-CM

## 2023-05-13 MED ORDER — ROSUVASTATIN CALCIUM 5 MG PO TABS: 5.0000 mg | ORAL_TABLET | Freq: Every day | ORAL | 3 refills | Status: DC

## 2023-05-13 NOTE — Telephone Encounter (Signed)
The patient has been notified of the result and verbalized understanding.  All questions (if any) were answered. Macie Burows, RN 05/13/2023 4:08 PM   Advised pt will need f/u FLP and ALT the end of Feb beginning of March.  Pt expresses understanding orders placed and released for draw.

## 2023-05-13 NOTE — Telephone Encounter (Signed)
-----   Message from Christell Constant sent at 05/11/2023  7:31 PM EST ----- Regarding: FW: LDL is above goal for presence of coronary artery calcifications. Recommend start receive statin 5 mg PO daily and repeat labs in three months. ----- Message ----- From: Interface, [facility] Lab Results In Sent: 05/10/2023   2:36 AM EST To: Christell Constant, MD

## 2023-06-15 ENCOUNTER — Other Ambulatory Visit: Payer: Self-pay

## 2023-06-15 ENCOUNTER — Inpatient Hospital Stay (HOSPITAL_COMMUNITY)
Admission: EM | Admit: 2023-06-15 | Discharge: 2023-06-18 | DRG: 445 | Disposition: A | Discharge status: Home / Self Care | Payer: Medicare Other | Attending: Internal Medicine | Admitting: Internal Medicine

## 2023-06-15 ENCOUNTER — Encounter (HOSPITAL_COMMUNITY): Payer: Self-pay

## 2023-06-15 ENCOUNTER — Emergency Department (HOSPITAL_COMMUNITY): Payer: Medicare Other

## 2023-06-15 DIAGNOSIS — K219 Gastro-esophageal reflux disease without esophagitis: Secondary | ICD-10-CM | POA: Diagnosis present

## 2023-06-15 DIAGNOSIS — Z833 Family history of diabetes mellitus: Secondary | ICD-10-CM | POA: Diagnosis not present

## 2023-06-15 DIAGNOSIS — I10 Essential (primary) hypertension: Secondary | ICD-10-CM | POA: Diagnosis not present

## 2023-06-15 DIAGNOSIS — N1832 Chronic kidney disease, stage 3b: Secondary | ICD-10-CM | POA: Diagnosis present

## 2023-06-15 DIAGNOSIS — I129 Hypertensive chronic kidney disease with stage 1 through stage 4 chronic kidney disease, or unspecified chronic kidney disease: Secondary | ICD-10-CM | POA: Diagnosis not present

## 2023-06-15 DIAGNOSIS — K5710 Diverticulosis of small intestine without perforation or abscess without bleeding: Secondary | ICD-10-CM | POA: Diagnosis not present

## 2023-06-15 DIAGNOSIS — Z885 Allergy status to narcotic agent status: Secondary | ICD-10-CM

## 2023-06-15 DIAGNOSIS — E66812 Obesity, class 2: Secondary | ICD-10-CM | POA: Diagnosis not present

## 2023-06-15 DIAGNOSIS — R7989 Other specified abnormal findings of blood chemistry: Secondary | ICD-10-CM | POA: Diagnosis not present

## 2023-06-15 DIAGNOSIS — Z9049 Acquired absence of other specified parts of digestive tract: Secondary | ICD-10-CM

## 2023-06-15 DIAGNOSIS — K760 Fatty (change of) liver, not elsewhere classified: Secondary | ICD-10-CM | POA: Diagnosis present

## 2023-06-15 DIAGNOSIS — R112 Nausea with vomiting, unspecified: Secondary | ICD-10-CM

## 2023-06-15 DIAGNOSIS — Z888 Allergy status to other drugs, medicaments and biological substances status: Secondary | ICD-10-CM | POA: Diagnosis not present

## 2023-06-15 DIAGNOSIS — Z79899 Other long term (current) drug therapy: Secondary | ICD-10-CM

## 2023-06-15 DIAGNOSIS — Z8042 Family history of malignant neoplasm of prostate: Secondary | ICD-10-CM

## 2023-06-15 DIAGNOSIS — R739 Hyperglycemia, unspecified: Secondary | ICD-10-CM | POA: Diagnosis present

## 2023-06-15 DIAGNOSIS — K5730 Diverticulosis of large intestine without perforation or abscess without bleeding: Secondary | ICD-10-CM | POA: Diagnosis not present

## 2023-06-15 DIAGNOSIS — R0789 Other chest pain: Secondary | ICD-10-CM | POA: Diagnosis not present

## 2023-06-15 DIAGNOSIS — R079 Chest pain, unspecified: Secondary | ICD-10-CM | POA: Diagnosis not present

## 2023-06-15 DIAGNOSIS — I422 Other hypertrophic cardiomyopathy: Secondary | ICD-10-CM | POA: Diagnosis present

## 2023-06-15 DIAGNOSIS — Z7982 Long term (current) use of aspirin: Secondary | ICD-10-CM | POA: Diagnosis not present

## 2023-06-15 DIAGNOSIS — I2510 Atherosclerotic heart disease of native coronary artery without angina pectoris: Secondary | ICD-10-CM | POA: Diagnosis not present

## 2023-06-15 DIAGNOSIS — K8050 Calculus of bile duct without cholangitis or cholecystitis without obstruction: Secondary | ICD-10-CM | POA: Diagnosis not present

## 2023-06-15 DIAGNOSIS — Z8249 Family history of ischemic heart disease and other diseases of the circulatory system: Secondary | ICD-10-CM

## 2023-06-15 DIAGNOSIS — R7401 Elevation of levels of liver transaminase levels: Secondary | ICD-10-CM | POA: Diagnosis not present

## 2023-06-15 DIAGNOSIS — Z23 Encounter for immunization: Secondary | ICD-10-CM | POA: Diagnosis not present

## 2023-06-15 DIAGNOSIS — D696 Thrombocytopenia, unspecified: Secondary | ICD-10-CM | POA: Diagnosis not present

## 2023-06-15 DIAGNOSIS — E782 Mixed hyperlipidemia: Secondary | ICD-10-CM | POA: Diagnosis present

## 2023-06-15 DIAGNOSIS — K449 Diaphragmatic hernia without obstruction or gangrene: Secondary | ICD-10-CM | POA: Diagnosis not present

## 2023-06-15 DIAGNOSIS — R1010 Upper abdominal pain, unspecified: Secondary | ICD-10-CM | POA: Diagnosis not present

## 2023-06-15 DIAGNOSIS — R109 Unspecified abdominal pain: Secondary | ICD-10-CM | POA: Diagnosis not present

## 2023-06-15 DIAGNOSIS — K838 Other specified diseases of biliary tract: Secondary | ICD-10-CM | POA: Diagnosis not present

## 2023-06-15 DIAGNOSIS — K8051 Calculus of bile duct without cholangitis or cholecystitis with obstruction: Principal | ICD-10-CM | POA: Diagnosis present

## 2023-06-15 DIAGNOSIS — R160 Hepatomegaly, not elsewhere classified: Secondary | ICD-10-CM | POA: Diagnosis not present

## 2023-06-15 DIAGNOSIS — N289 Disorder of kidney and ureter, unspecified: Secondary | ICD-10-CM | POA: Diagnosis not present

## 2023-06-15 DIAGNOSIS — Z90710 Acquired absence of both cervix and uterus: Secondary | ICD-10-CM

## 2023-06-15 DIAGNOSIS — Z6839 Body mass index (BMI) 39.0-39.9, adult: Secondary | ICD-10-CM

## 2023-06-15 LAB — CBC WITH DIFFERENTIAL/PLATELET
Abs Immature Granulocytes: 0.02 10*3/uL (ref 0.00–0.07)
Basophils Absolute: 0 10*3/uL (ref 0.0–0.1)
Basophils Relative: 0 %
Eosinophils Absolute: 0 10*3/uL (ref 0.0–0.5)
Eosinophils Relative: 1 %
HCT: 43.9 % (ref 36.0–46.0)
Hemoglobin: 14.2 g/dL (ref 12.0–15.0)
Immature Granulocytes: 0 %
Lymphocytes Relative: 16 %
Lymphs Abs: 1 10*3/uL (ref 0.7–4.0)
MCH: 30 pg (ref 26.0–34.0)
MCHC: 32.3 g/dL (ref 30.0–36.0)
MCV: 92.6 fL (ref 80.0–100.0)
Monocytes Absolute: 0.3 10*3/uL (ref 0.1–1.0)
Monocytes Relative: 4 %
Neutro Abs: 5.3 10*3/uL (ref 1.7–7.7)
Neutrophils Relative %: 79 %
Platelets: 158 10*3/uL (ref 150–400)
RBC: 4.74 MIL/uL (ref 3.87–5.11)
RDW: 12.5 % (ref 11.5–15.5)
WBC: 6.6 10*3/uL (ref 4.0–10.5)
nRBC: 0 % (ref 0.0–0.2)

## 2023-06-15 LAB — BASIC METABOLIC PANEL
Anion gap: 7 (ref 5–15)
BUN: 20 mg/dL (ref 8–23)
CO2: 26 mmol/L (ref 22–32)
Calcium: 9.4 mg/dL (ref 8.9–10.3)
Chloride: 103 mmol/L (ref 98–111)
Creatinine, Ser: 1.32 mg/dL — ABNORMAL HIGH (ref 0.44–1.00)
GFR, Estimated: 44 mL/min — ABNORMAL LOW (ref >60)
Glucose, Bld: 151 mg/dL — ABNORMAL HIGH (ref 70–99)
Potassium: 3.8 mmol/L (ref 3.5–5.1)
Sodium: 136 mmol/L (ref 135–145)

## 2023-06-15 LAB — HEPATIC FUNCTION PANEL
ALT: 184 U/L — ABNORMAL HIGH (ref 0–44)
AST: 236 U/L — ABNORMAL HIGH (ref 15–41)
Albumin: 4.1 g/dL (ref 3.5–5.0)
Alkaline Phosphatase: 125 U/L (ref 38–126)
Bilirubin, Direct: 0.7 mg/dL — ABNORMAL HIGH (ref 0.0–0.2)
Indirect Bilirubin: 0.7 mg/dL (ref 0.3–0.9)
Total Bilirubin: 1.4 mg/dL — ABNORMAL HIGH (ref <1.2)
Total Protein: 7.9 g/dL (ref 6.5–8.1)

## 2023-06-15 LAB — TROPONIN I (HIGH SENSITIVITY): Troponin I (High Sensitivity): 11 ng/L (ref <18)

## 2023-06-15 LAB — LIPASE, BLOOD: Lipase: 44 U/L (ref 11–51)

## 2023-06-15 MED ORDER — MORPHINE SULFATE (PF) 4 MG/ML IV SOLN
4.0000 mg | Freq: Once | INTRAVENOUS | Status: AC
Start: 2023-06-15 — End: 2023-06-16
  Administered 2023-06-16: 4 mg via INTRAVENOUS
  Filled 2023-06-15: qty 1

## 2023-06-15 MED ORDER — ONDANSETRON HCL 4 MG/2ML IJ SOLN
4.0000 mg | Freq: Once | INTRAMUSCULAR | Status: AC
Start: 2023-06-15 — End: 2023-06-15
  Administered 2023-06-15: 4 mg via INTRAVENOUS
  Filled 2023-06-15: qty 2

## 2023-06-15 MED ORDER — MORPHINE SULFATE (PF) 4 MG/ML IV SOLN
4.0000 mg | Freq: Once | INTRAVENOUS | Status: AC
  Administered 2023-06-15: 4 mg via INTRAVENOUS
  Filled 2023-06-15: qty 1

## 2023-06-15 MED ORDER — ONDANSETRON 4 MG PO TBDP
4.0000 mg | ORAL_TABLET | Freq: Once | ORAL | Status: AC
Start: 2023-06-15 — End: 2023-06-15
  Administered 2023-06-15: 4 mg via ORAL
  Filled 2023-06-15: qty 1

## 2023-06-15 MED ORDER — IOHEXOL 300 MG/ML  SOLN
80.0000 mL | Freq: Once | INTRAMUSCULAR | Status: AC | PRN
  Administered 2023-06-15: 80 mL via INTRAVENOUS

## 2023-06-15 NOTE — ED Triage Notes (Signed)
Pt arrived via POV from home c/o chest pain that began 2 hrs PTA. Pt reports pain is generalized and radiates to her back.

## 2023-06-15 NOTE — ED Provider Notes (Signed)
[place] EMERGENCY DEPARTMENT AT [facility] Provider Note   CSN: [id number] Arrival date & time: 06/15/23  2147     History {Add pertinent medical, surgical, social history, OB history to HPI:1} Chief Complaint  Patient presents with   Chest Pain    Traci Chandler is a 67 y.o. female.  The history is provided by the patient.  Chest Pain She has history of hyperlipidemia, hypertrophic cardiomyopathy, aortic atherosclerosis and comes in because of a pressure feeling in the epigastric area and lower sternal area with radiation to the back.  There is associated nausea and vomiting but no dyspnea or diaphoresis.  Pain started about 9 PM and came on at rest.  She had a similar episode 1 week ago which also came on at rest but resolved spontaneously.   Home Medications Prior to Admission medications   Medication Sig Start Date End Date Taking? Authorizing Provider  aspirin 81 MG tablet Take 81 mg by mouth every evening.     [provider]  atenolol (TENORMIN) 50 MG tablet Take 1 tablet by mouth daily. 02/11/18   [provider]  NIFEdipine (PROCARDIA XL/NIFEDICAL XL) 60 MG 24 hr tablet Take 60 mg by mouth daily. 07/09/19   [provider]  omeprazole (PRILOSEC) 20 MG capsule TAKE 1 CAPSULE 2 TIMES A DAY BEFORE A MEAL. 08/10/19   Anice Paganini, NP  polyethylene glycol (MIRALAX / GLYCOLAX) 17 g packet Take 17 g by mouth daily.    [provider]  predniSONE (DELTASONE) 50 MG tablet Take 1 tablet daily with breakfast for the next 5 days. 03/29/23   Leath-Warren, Sadie Haber, NP  rosuvastatin (CRESTOR) 5 MG tablet Take 1 tablet (5 mg total) by mouth daily. 05/13/23   Christell Constant, MD  spironolactone (ALDACTONE) 25 MG tablet Take 25 mg by mouth 2 (two) times daily.    [provider]  traMADol (ULTRAM) 50 MG tablet as needed for severe pain. 07/02/19   [provider]  triamcinolone cream (KENALOG) 0.1 % as needed (rash).  01/24/21   [provider]      Allergies    Codeine, Diclofenac, and Lisinopril    Review of Systems   Review of Systems  Cardiovascular:  Positive for chest pain.  All other systems reviewed and are negative.   Physical Exam Updated Vital Signs BP (!) 183/83 (BP Location: Right Arm)   Pulse 82   Temp 99.1 F (37.3 C) (Oral)   Resp 16   Ht 5\' 7"  (1.702 m)   Wt 113 kg   SpO2 99%   BMI 39.02 kg/m  Physical Exam Vitals and nursing note reviewed.   67 year old female, resting comfortably and in no acute distress. Vital signs are significant for elevated blood pressure. Oxygen saturation is 99%, which is normal. Head is normocephalic and atraumatic. PERRLA, EOMI. Oropharynx is clear. Neck is nontender and supple without adenopathy or JVD. Back is nontender and there is no CVA tenderness. Lungs are clear without rales, wheezes, or rhonchi. Chest is nontender. Heart has regular rate and rhythm without murmur. Abdomen is soft, flat, with moderate epigastric tenderness as well as mild right upper quadrant and left upper quadrant tenderness.  There is no rebound or guarding. Extremities have no cyanosis or edema, full range of motion is present. Skin is warm and dry without rash. Neurologic: Mental status is normal, moves all extremities equally.  ED Results / Procedures / Treatments   Labs (all  labs ordered are listed, but only abnormal results are displayed) Labs Reviewed  BASIC METABOLIC PANEL - Abnormal; Notable for the following components:      Result Value   Glucose, Bld 151 (*)    Creatinine, Ser 1.32 (*)    GFR, Estimated 44 (*)    All other components within normal limits  HEPATIC FUNCTION PANEL - Abnormal; Notable for the following components:   AST 236 (*)    ALT 184 (*)    Total Bilirubin 1.4 (*)    Bilirubin, Direct 0.7 (*)    All other components within normal limits  CBC WITH DIFFERENTIAL/PLATELET  LIPASE, BLOOD  TROPONIN I (HIGH SENSITIVITY)   TROPONIN I (HIGH SENSITIVITY)    EKG EKG Interpretation Date/Time:  Saturday June 15 2023 22:02:14 EST Ventricular Rate:  82 PR Interval:  252 QRS Duration:  112 QT Interval:  382 QTC Calculation: 446 R Axis:   12  Text Interpretation: Sinus rhythm with 1st degree A-V block Minimal voltage criteria for LVH, may be normal variant ( Cornell product ) T wave abnormality, consider lateral ischemia Abnormal ECG When compared with ECG of 02-Mar-2021 11:48, No significant change was found Confirmed by Dione Booze ([id number]) on 06/15/2023 10:55:00 PM  Radiology DG Chest 2 View Result Date: 06/15/2023 CLINICAL DATA:  Chest pain. EXAM: CHEST - 2 VIEW COMPARISON:  01/08/2018 FINDINGS: Stable upper normal heart size.The cardiomediastinal contours are normal. The lungs are clear. Pulmonary vasculature is normal. No consolidation, pleural effusion, or pneumothorax. No acute osseous abnormalities are seen. IMPRESSION: No acute chest findings. Electronically Signed   By: Narda Rutherford M.D.   On: 06/15/2023 22:36    Procedures Procedures  Cardiac monitor shows normal sinus rhythm, per my interpretation.  Medications Ordered in ED Medications  ondansetron (ZOFRAN-ODT) disintegrating tablet 4 mg (4 mg Oral Given 06/15/23 2233)    ED Course/ Medical Decision Making/ A&P   {   Click here for ABCD2, HEART and other calculatorsREFRESH Note before signing :1}                              Medical Decision Making Amount and/or Complexity of Data Reviewed Labs: ordered. Radiology: ordered.  Risk Prescription drug management.   Epigastric and lower chest pain.  Well ACS needs to be ruled out, I believe it is much more likely her pain is of GI origin.  Consider GERD, peptic ulcer disease, pancreatitis, cholangitis, diverticulitis.  She is status post cholecystectomy.  I have reviewed her ECG, and my interpretation is left ventricular hypertrophy with secondary repolarization changes unchanged  from prior.  Chest x-ray shows no active cardiopulmonary disease.  Have independently viewed the images, and agree with radiologist's interpretation.  She was given a dose of ondansetron at triage, but vomited after that.  I have ordered a dose of intravenous ondansetron and morphine.  I have reviewed her past records, and on 01/21/2023 she had CT coronary angiogram which showed minimal nonobstructive coronary artery disease, which would make ACS very unlikely.  I reviewed her laboratory test, my interpretation is elevated random glucose which will need to be followed as an outpatient, elevation of creatinine which had been present previously, elevated transaminases which are new compared with 05/09/2023, normal lipase, mild elevation of bilirubin which is also new compared with 05/09/2023, normal CBC.  She had only modest relief of pain with morphine but she had good relief of nausea with ondansetron.  Have ordered  additional morphine.  I have ordered CT of abdomen and pelvis to evaluate for dilated bile ducts (ultrasound not available).  {Document critical care time when appropriate:1} {Document review of labs and clinical decision tools ie heart score, Chads2Vasc2 etc:1}  {Document your independent review of radiology images, and any outside records:1} {Document your discussion with family members, caretakers, and with consultants:1} {Document social determinants of health affecting pt's care:1} {Document your decision making why or why not admission, treatments were needed:1} Final Clinical Impression(s) / ED Diagnoses Final diagnoses:  None    Rx / DC Orders ED Discharge Orders     None

## 2023-06-16 ENCOUNTER — Inpatient Hospital Stay (HOSPITAL_COMMUNITY): Payer: Medicare Other

## 2023-06-16 ENCOUNTER — Encounter (HOSPITAL_COMMUNITY): Payer: Self-pay | Admitting: Internal Medicine

## 2023-06-16 DIAGNOSIS — Z7982 Long term (current) use of aspirin: Secondary | ICD-10-CM | POA: Diagnosis not present

## 2023-06-16 DIAGNOSIS — R7989 Other specified abnormal findings of blood chemistry: Secondary | ICD-10-CM | POA: Diagnosis not present

## 2023-06-16 DIAGNOSIS — Z23 Encounter for immunization: Secondary | ICD-10-CM | POA: Diagnosis not present

## 2023-06-16 DIAGNOSIS — R7401 Elevation of levels of liver transaminase levels: Secondary | ICD-10-CM | POA: Diagnosis not present

## 2023-06-16 DIAGNOSIS — D696 Thrombocytopenia, unspecified: Secondary | ICD-10-CM | POA: Diagnosis present

## 2023-06-16 DIAGNOSIS — R112 Nausea with vomiting, unspecified: Secondary | ICD-10-CM | POA: Diagnosis not present

## 2023-06-16 DIAGNOSIS — E66812 Obesity, class 2: Secondary | ICD-10-CM | POA: Diagnosis present

## 2023-06-16 DIAGNOSIS — R739 Hyperglycemia, unspecified: Secondary | ICD-10-CM | POA: Diagnosis not present

## 2023-06-16 DIAGNOSIS — Z833 Family history of diabetes mellitus: Secondary | ICD-10-CM | POA: Diagnosis not present

## 2023-06-16 DIAGNOSIS — K8051 Calculus of bile duct without cholangitis or cholecystitis with obstruction: Secondary | ICD-10-CM | POA: Diagnosis present

## 2023-06-16 DIAGNOSIS — I10 Essential (primary) hypertension: Secondary | ICD-10-CM

## 2023-06-16 DIAGNOSIS — Z0189 Encounter for other specified special examinations: Secondary | ICD-10-CM | POA: Diagnosis not present

## 2023-06-16 DIAGNOSIS — I422 Other hypertrophic cardiomyopathy: Secondary | ICD-10-CM

## 2023-06-16 DIAGNOSIS — R17 Unspecified jaundice: Secondary | ICD-10-CM | POA: Diagnosis not present

## 2023-06-16 DIAGNOSIS — K838 Other specified diseases of biliary tract: Secondary | ICD-10-CM | POA: Diagnosis not present

## 2023-06-16 DIAGNOSIS — Z6839 Body mass index (BMI) 39.0-39.9, adult: Secondary | ICD-10-CM | POA: Diagnosis not present

## 2023-06-16 DIAGNOSIS — I129 Hypertensive chronic kidney disease with stage 1 through stage 4 chronic kidney disease, or unspecified chronic kidney disease: Secondary | ICD-10-CM | POA: Diagnosis not present

## 2023-06-16 DIAGNOSIS — Z90710 Acquired absence of both cervix and uterus: Secondary | ICD-10-CM | POA: Diagnosis not present

## 2023-06-16 DIAGNOSIS — Z888 Allergy status to other drugs, medicaments and biological substances status: Secondary | ICD-10-CM | POA: Diagnosis not present

## 2023-06-16 DIAGNOSIS — R1010 Upper abdominal pain, unspecified: Secondary | ICD-10-CM | POA: Diagnosis not present

## 2023-06-16 DIAGNOSIS — K449 Diaphragmatic hernia without obstruction or gangrene: Secondary | ICD-10-CM | POA: Diagnosis not present

## 2023-06-16 DIAGNOSIS — K8050 Calculus of bile duct without cholangitis or cholecystitis without obstruction: Principal | ICD-10-CM | POA: Diagnosis present

## 2023-06-16 DIAGNOSIS — Z9049 Acquired absence of other specified parts of digestive tract: Secondary | ICD-10-CM | POA: Diagnosis not present

## 2023-06-16 DIAGNOSIS — K760 Fatty (change of) liver, not elsewhere classified: Secondary | ICD-10-CM | POA: Diagnosis present

## 2023-06-16 DIAGNOSIS — I2510 Atherosclerotic heart disease of native coronary artery without angina pectoris: Secondary | ICD-10-CM | POA: Diagnosis present

## 2023-06-16 DIAGNOSIS — K5730 Diverticulosis of large intestine without perforation or abscess without bleeding: Secondary | ICD-10-CM | POA: Diagnosis not present

## 2023-06-16 DIAGNOSIS — Z8249 Family history of ischemic heart disease and other diseases of the circulatory system: Secondary | ICD-10-CM | POA: Diagnosis not present

## 2023-06-16 DIAGNOSIS — K219 Gastro-esophageal reflux disease without esophagitis: Secondary | ICD-10-CM | POA: Diagnosis not present

## 2023-06-16 DIAGNOSIS — Z885 Allergy status to narcotic agent status: Secondary | ICD-10-CM | POA: Diagnosis not present

## 2023-06-16 DIAGNOSIS — Z79899 Other long term (current) drug therapy: Secondary | ICD-10-CM | POA: Diagnosis not present

## 2023-06-16 DIAGNOSIS — N1832 Chronic kidney disease, stage 3b: Secondary | ICD-10-CM

## 2023-06-16 DIAGNOSIS — R109 Unspecified abdominal pain: Secondary | ICD-10-CM | POA: Diagnosis not present

## 2023-06-16 DIAGNOSIS — E782 Mixed hyperlipidemia: Secondary | ICD-10-CM

## 2023-06-16 DIAGNOSIS — Z8042 Family history of malignant neoplasm of prostate: Secondary | ICD-10-CM | POA: Diagnosis not present

## 2023-06-16 DIAGNOSIS — K5710 Diverticulosis of small intestine without perforation or abscess without bleeding: Secondary | ICD-10-CM | POA: Diagnosis not present

## 2023-06-16 LAB — COMPREHENSIVE METABOLIC PANEL
ALT: 256 U/L — ABNORMAL HIGH (ref 0–44)
AST: 238 U/L — ABNORMAL HIGH (ref 15–41)
Albumin: 3.6 g/dL (ref 3.5–5.0)
Alkaline Phosphatase: 109 U/L (ref 38–126)
Anion gap: 11 (ref 5–15)
BUN: 25 mg/dL — ABNORMAL HIGH (ref 8–23)
CO2: 23 mmol/L (ref 22–32)
Calcium: 9.4 mg/dL (ref 8.9–10.3)
Chloride: 102 mmol/L (ref 98–111)
Creatinine, Ser: 1.63 mg/dL — ABNORMAL HIGH (ref 0.44–1.00)
GFR, Estimated: 34 mL/min — ABNORMAL LOW (ref >60)
Glucose, Bld: 122 mg/dL — ABNORMAL HIGH (ref 70–99)
Potassium: 4.5 mmol/L (ref 3.5–5.1)
Sodium: 136 mmol/L (ref 135–145)
Total Bilirubin: 3 mg/dL — ABNORMAL HIGH (ref <1.2)
Total Protein: 7.3 g/dL (ref 6.5–8.1)

## 2023-06-16 LAB — CBC
HCT: 40 % (ref 36.0–46.0)
Hemoglobin: 13.4 g/dL (ref 12.0–15.0)
MCH: 30.5 pg (ref 26.0–34.0)
MCHC: 33.5 g/dL (ref 30.0–36.0)
MCV: 90.9 fL (ref 80.0–100.0)
Platelets: 147 10*3/uL — ABNORMAL LOW (ref 150–400)
RBC: 4.4 MIL/uL (ref 3.87–5.11)
RDW: 12.8 % (ref 11.5–15.5)
WBC: 19.1 10*3/uL — ABNORMAL HIGH (ref 4.0–10.5)
nRBC: 0 % (ref 0.0–0.2)

## 2023-06-16 LAB — TROPONIN I (HIGH SENSITIVITY): Troponin I (High Sensitivity): 10 ng/L (ref <18)

## 2023-06-16 MED ORDER — SODIUM CHLORIDE 0.9 % IV SOLN: INTRAVENOUS | Status: AC

## 2023-06-16 MED ORDER — PANTOPRAZOLE SODIUM 40 MG PO TBEC
40.0000 mg | DELAYED_RELEASE_TABLET | Freq: Every day | ORAL | Status: DC
  Administered 2023-06-16 – 2023-06-18 (×2): 40 mg via ORAL
  Filled 2023-06-16 (×2): qty 1

## 2023-06-16 MED ORDER — ONDANSETRON HCL 4 MG PO TABS: 4.0000 mg | ORAL_TABLET | Freq: Four times a day (QID) | ORAL | Status: DC | PRN

## 2023-06-16 MED ORDER — PNEUMOCOCCAL 20-VAL CONJ VACC 0.5 ML IM SUSY
0.5000 mL | PREFILLED_SYRINGE | INTRAMUSCULAR | Status: AC
  Administered 2023-06-17: 0.5 mL via INTRAMUSCULAR
  Filled 2023-06-16: qty 0.5

## 2023-06-16 MED ORDER — PIPERACILLIN-TAZOBACTAM 3.375 G IVPB
3.3750 g | Freq: Three times a day (TID) | INTRAVENOUS | Status: DC
  Administered 2023-06-16 – 2023-06-18 (×5): 3.375 g via INTRAVENOUS
  Filled 2023-06-16 (×6): qty 50

## 2023-06-16 MED ORDER — ONDANSETRON HCL 4 MG/2ML IJ SOLN
4.0000 mg | Freq: Four times a day (QID) | INTRAMUSCULAR | Status: DC | PRN
  Administered 2023-06-17: 4 mg via INTRAVENOUS
  Filled 2023-06-16: qty 2

## 2023-06-16 MED ORDER — ATENOLOL 25 MG PO TABS
50.0000 mg | ORAL_TABLET | Freq: Every day | ORAL | Status: DC
  Administered 2023-06-16 – 2023-06-18 (×2): 50 mg via ORAL
  Filled 2023-06-16 (×2): qty 2

## 2023-06-16 MED ORDER — HYDROMORPHONE HCL 1 MG/ML IJ SOLN: 0.5000 mg | INTRAMUSCULAR | Status: DC | PRN

## 2023-06-16 MED ORDER — HEPARIN SODIUM (PORCINE) 5000 UNIT/ML IJ SOLN
5000.0000 [IU] | Freq: Three times a day (TID) | INTRAMUSCULAR | Status: DC
  Administered 2023-06-16 – 2023-06-17 (×3): 5000 [IU] via SUBCUTANEOUS
  Filled 2023-06-16 (×3): qty 1

## 2023-06-16 MED ORDER — ROSUVASTATIN CALCIUM 10 MG PO TABS
5.0000 mg | ORAL_TABLET | Freq: Every day | ORAL | Status: DC
  Filled 2023-06-16: qty 1

## 2023-06-16 NOTE — Progress Notes (Signed)
Pt has bed assignment at [facility]. [facility] notified for transport and nurse report called to receiving unit [facility] [place]). Pt notified of bed assignment and wait for transportation. States understanding. Pt has tolerated clear liquids with no n/v or complaint of abd pain this shift.

## 2023-06-16 NOTE — Progress Notes (Signed)
Pt transported via stretcher by [facility] to [facility]. Pt with all personal belongings in her possession at time of transfer.

## 2023-06-16 NOTE — ED Notes (Signed)
ED TO INPATIENT HANDOFF REPORT  ED Nurse Name and Phone #: Karoline Caldwell [phone]   S Name/Age/Gender Traci Chandler 67 y.o. female Room/Bed: APA01/APA01  Code Status   Code Status: Prior  Home/SNF/Other Home Patient oriented to: self, place, time, and situation Is this baseline? Yes   Triage Complete: Triage complete  Chief Complaint Choledocholithiasis [K80.50]  Triage Note Pt arrived via POV from home c/o chest pain that began 2 hrs PTA. Pt reports pain is generalized and radiates to her back.    Allergies Allergies  Allergen Reactions   Codeine Nausea And Vomiting   Diclofenac Other (See Comments)   Lisinopril Other (See Comments)    Level of Care/Admitting Diagnosis ED Disposition     ED Disposition  Admit   Condition  --   Comment  Hospital Area: [facility] [[id number]]  Level of Care: Med-Surg [16]  Covid Evaluation: Asymptomatic - no recent exposure (last 10 days) testing not required  Diagnosis: Choledocholithiasis [657846]  Admitting Physician: Frankey Shown [[id number]]  Attending Physician: Frankey Shown [[id number]]  Certification:: I certify this patient will need inpatient services for at least 2 midnights  Expected Medical Readiness: 06/19/2023          B Medical/Surgery History Past Medical History:  Diagnosis Date   Essential hypertension    GERD (gastroesophageal reflux disease)    Hypertrophic non-obstructive cardiomyopathy (HCC)    Echocardiogram 2011   Past Surgical History:  Procedure Laterality Date   ABDOMINAL HYSTERECTOMY     CHOLECYSTECTOMY N/A 03/07/2021   Procedure: LAPAROSCOPIC CHOLECYSTECTOMY;  Surgeon: [name], MD;  Location: [facility] ORS;  Service: General;  Laterality: N/A;   COLONOSCOPY N/A 04/15/2013   Procedure: COLONOSCOPY;  Surgeon: West Bali, MD;  Location: [facility] ENDO SUITE;  Service: Endoscopy;  Laterality: N/A;  9:30     A IV Location/Drains/Wounds Patient Lines/Drains/Airways Status     Active  Line/Drains/Airways     Name Placement date Placement time Site Days   Peripheral IV 06/15/23 20 G Left Antecubital 06/15/23  2324  Antecubital  1   Closed System Drain 1 Left;Midline Abdomen Bulb (JP) 03/07/21  1608  Abdomen  831   Incision - 4 Ports Abdomen 1: Umbilicus 2: Mid;Upper 3: Right;Upper 4: Right;Lateral 03/07/21  1405  -- 831            Intake/Output Last 24 hours No intake or output data in the 24 hours ending 06/16/23 0225  Labs/Imaging Results for orders placed or performed during the hospital encounter of 06/15/23 (from the past 48 hours)  Basic metabolic panel     Status: Abnormal   Collection Time: 06/15/23 10:48 PM  Result Value Ref Range   Sodium 136 135 - 145 mmol/L   Potassium 3.8 3.5 - 5.1 mmol/L   Chloride 103 98 - 111 mmol/L   CO2 26 22 - 32 mmol/L   Glucose, Bld 151 (H) 70 - 99 mg/dL    Comment: Glucose reference range applies only to samples taken after fasting for at least 8 hours.   BUN 20 8 - 23 mg/dL   Creatinine, Ser 1.02 (H) 0.44 - 1.00 mg/dL   Calcium 9.4 8.9 - 72.5 mg/dL   GFR, Estimated 44 (L) >60 mL/min    Comment: (NOTE) Calculated using the CKD-EPI Creatinine Equation (2021)    Anion gap 7 5 - 15    Comment: Performed at [facility], [address] [place], [place] [address]  Troponin I (High Sensitivity)  Status: None   Collection Time: 06/15/23 10:48 PM  Result Value Ref Range   Troponin I (High Sensitivity) 11 <18 ng/L    Comment: (NOTE) Elevated high sensitivity troponin I (hsTnI) values and significant  changes across serial measurements may suggest ACS but many other  chronic and acute conditions are known to elevate hsTnI results.  Refer to the "Links" section for chest pain algorithms and additional  guidance. Performed at [facility], [address], [place], [place] [address]   CBC with Differential     Status: None   Collection Time: 06/15/23 10:48 PM  Result Value Ref Range   WBC 6.6 4.0 - 10.5 K/uL   RBC 4.74  3.87 - 5.11 MIL/uL   Hemoglobin 14.2 12.0 - 15.0 g/dL   HCT 44.0 10.2 - 72.5 %   MCV 92.6 80.0 - 100.0 fL   MCH 30.0 26.0 - 34.0 pg   MCHC 32.3 30.0 - 36.0 g/dL   RDW 36.6 44.0 - 34.7 %   Platelets 158 150 - 400 K/uL   nRBC 0.0 0.0 - 0.2 %   Neutrophils Relative % 79 %   Neutro Abs 5.3 1.7 - 7.7 K/uL   Lymphocytes Relative 16 %   Lymphs Abs 1.0 0.7 - 4.0 K/uL   Monocytes Relative 4 %   Monocytes Absolute 0.3 0.1 - 1.0 K/uL   Eosinophils Relative 1 %   Eosinophils Absolute 0.0 0.0 - 0.5 K/uL   Basophils Relative 0 %   Basophils Absolute 0.0 0.0 - 0.1 K/uL   Immature Granulocytes 0 %   Abs Immature Granulocytes 0.02 0.00 - 0.07 K/uL    Comment: Performed at [facility], [address], [place], [place] [address]  Lipase, blood     Status: None   Collection Time: 06/15/23 10:48 PM  Result Value Ref Range   Lipase 44 11 - 51 U/L    Comment: Performed at [facility], [address], [place], [place] [address]  Hepatic function panel     Status: Abnormal   Collection Time: 06/15/23 10:48 PM  Result Value Ref Range   Total Protein 7.9 6.5 - 8.1 g/dL   Albumin 4.1 3.5 - 5.0 g/dL   AST 643 (H) 15 - 41 U/L   ALT 184 (H) 0 - 44 U/L   Alkaline Phosphatase 125 38 - 126 U/L   Total Bilirubin 1.4 (H) <1.2 mg/dL   Bilirubin, Direct 0.7 (H) 0.0 - 0.2 mg/dL   Indirect Bilirubin 0.7 0.3 - 0.9 mg/dL    Comment: Performed at [facility], [address], [place], [place] [address]  Troponin I (High Sensitivity)     Status: None   Collection Time: 06/16/23 12:58 AM  Result Value Ref Range   Troponin I (High Sensitivity) 10 <18 ng/L    Comment: (NOTE) Elevated high sensitivity troponin I (hsTnI) values and significant  changes across serial measurements may suggest ACS but many other  chronic and acute conditions are known to elevate hsTnI results.  Refer to the "Links" section for chest pain algorithms and additional  guidance. Performed at [facility], [address] [place], [place]  [address]    CT ABDOMEN PELVIS W CONTRAST Result Date: 06/16/2023 CLINICAL DATA:  LUQ abdominal pain c/o chest pain that began 2 hrs PTA. Pt reports pain is generalized and radiates to her back EXAM: CT ABDOMEN AND PELVIS WITH CONTRAST TECHNIQUE: Multidetector CT imaging of the abdomen and pelvis was performed using the standard protocol following bolus  administration of intravenous contrast. RADIATION DOSE REDUCTION: This exam was performed according to the departmental dose-optimization program which includes automated exposure control, adjustment of the mA and/or kV according to patient size and/or use of iterative reconstruction technique. CONTRAST:  80mL OMNIPAQUE IOHEXOL 300 MG/ML  SOLN COMPARISON:  CT abdomen pelvis 03/02/2021 FINDINGS: Lower chest: Small hiatal hernia.  No acute abnormality. Hepatobiliary: The liver is enlarged measuring up to 22 cm. No focal liver abnormality. Status post cholecystectomy. Intra and extrahepatic biliary dilatation. Rounded density measuring 11 mm at the distal common bile duct. Pancreas: No focal lesion. Normal pancreatic contour. No surrounding inflammatory changes. Stable proximal pancreatic duct focal dilatation up to 6 mm. Spleen: Normal in size without focal abnormality. Adrenals/Urinary Tract: No adrenal nodule bilaterally. Bilateral kidneys enhance symmetrically. Fluid dense lesions of the kidneys likely represent simple renal cysts. Simple renal cysts, in the absence of clinically indicated signs/symptoms, require no independent follow-up. No hydronephrosis. No hydroureter. The urinary bladder is unremarkable. On delayed imaging, there is no urothelial wall thickening and there are no filling defects in the opacified portions of the bilateral collecting systems or ureters. Stomach/Bowel: Stomach is within normal limits. No evidence of bowel wall thickening or dilatation. Colonic diverticulosis. Appendix appears normal. Vascular/Lymphatic: No abdominal aorta or iliac  aneurysm. Severe atherosclerotic plaque of the aorta and its branches. No abdominal, pelvic, or inguinal lymphadenopathy. Reproductive: Uterus and bilateral adnexa are unremarkable. Bilateral Bartholin cysts again noted (2:87) Other: No intraperitoneal free fluid. No intraperitoneal free gas. No organized fluid collection. Musculoskeletal: No abdominal wall hernia or abnormality.  Diastasis rectus. No suspicious lytic or blastic osseous lesions. No acute displaced fracture. IMPRESSION: 1. Interval development of intra and extrahepatic biliary ductal dilatation with a 11 mm rounded density in the distal common bile duct. Status post cholecystectomy. Findings suggestive of obstructive choledocholithiasis with underlying soft tissue mass not fully excluded. 2. Hepatomegaly. 3. Small hiatal hernia. 4. Colonic diverticulosis with no acute diverticulitis. 5. Bilateral Bartholin cysts. 6.  Aortic Atherosclerosis (ICD10-I70.0). Electronically Signed   By: Tish Frederickson M.D.   On: 06/16/2023 00:03   DG Chest 2 View Result Date: 06/15/2023 CLINICAL DATA:  Chest pain. EXAM: CHEST - 2 VIEW COMPARISON:  01/08/2018 FINDINGS: Stable upper normal heart size.The cardiomediastinal contours are normal. The lungs are clear. Pulmonary vasculature is normal. No consolidation, pleural effusion, or pneumothorax. No acute osseous abnormalities are seen. IMPRESSION: No acute chest findings. Electronically Signed   By: Narda Rutherford M.D.   On: 06/15/2023 22:36    Pending Labs Unresulted Labs (From admission, onward)    None       Vitals/Pain Today's Vitals   06/16/23 0022 06/16/23 0024 06/16/23 0113 06/16/23 0215  BP: (!) 156/64   (!) 151/89  Pulse: 91 89  98  Resp: 20 (!) 23  18  Temp:    98.9 F (37.2 C)  TempSrc:      SpO2: 94% 92%  95%  Weight:      Height:      PainSc:   6      Isolation Precautions No active isolations  Medications Medications  ondansetron (ZOFRAN-ODT) disintegrating tablet 4 mg  (4 mg Oral Given 06/15/23 2233)  ondansetron (ZOFRAN) injection 4 mg (4 mg Intravenous Given 06/15/23 2325)  morphine (PF) 4 MG/ML injection 4 mg (4 mg Intravenous Given 06/15/23 2325)  morphine (PF) 4 MG/ML injection 4 mg (4 mg Intravenous Given 06/16/23 0019)  iohexol (OMNIPAQUE) 300 MG/ML solution 80 mL (80 mLs Intravenous Contrast  Given 06/15/23 2339)    Mobility walks     Focused Assessments    R Recommendations: See Admitting Provider Note  Report given to:   Additional Notes:

## 2023-06-16 NOTE — Hospital Course (Signed)
67 year old female with a history of hypertension, hypertrophic cardiomyopathy, hyperlipidemia presenting with upper abdominal pain with associated nausea and vomiting in the early morning 06/16/2023.  The patient had a similar episode on 06/10/2023 with nausea and vomiting.  She stated that it improved spontaneously and did not have a recurrence until 06/16/2023.  She denies any new medications.  She denies any alcohol or illicit drug use.  She has had subjective fevers and chills at home.  She denies any headache, chest pain, shortness of breath, coughing. In the ED, the patient had low-grade temperature 100.4.  She was tachycardic in the 110.  She is hemodynamically stable with oxygen saturation 97% room air.  WBC 6.6, hemoglobin 14.2, plates 621.  Sodium 136, potassium 3.8, bicarbonate 26, serum creatinine 1.32. CT abdomen and pelvis showed intra and extrahepatic biliary ductal dilatation with 11 mm round density in the distal CBD.  MRCP was then obtained and showed choledocholithiasis with dilated ducts. GI, Dr. Tomasa Rand, was consulted and requested transfer to [facility] for ERCP.

## 2023-06-16 NOTE — Progress Notes (Signed)
Pt reports she feels much better this AM. MRI called and will transport patient for MRCP. Kellogg RN

## 2023-06-16 NOTE — Progress Notes (Addendum)
PROGRESS NOTE  RAMAYA ODONOHUE WNU:[id number] DOB: [birth year] DOA: 06/15/2023 PCP: Irena Reichmann, DO  Brief History:  67 year old female with a history of hypertension, hypertrophic cardiomyopathy, hyperlipidemia presenting with upper abdominal pain with associated nausea and vomiting in the early morning 06/16/2023.  The patient had a similar episode on 06/10/2023 with nausea and vomiting.  She stated that it improved spontaneously and did not have a recurrence until 06/16/2023.  She denies any new medications.  She denies any alcohol or illicit drug use.  She has had subjective fevers and chills at home.  She denies any headache, chest pain, shortness of breath, coughing. In the ED, the patient had low-grade temperature 100.4.  She was tachycardic in the 110.  She is hemodynamically stable with oxygen saturation 97% room air.  WBC 6.6, hemoglobin 14.2, plates 034.  Sodium 136, potassium 3.8, bicarbonate 26, serum creatinine 1.32. CT abdomen and pelvis showed intra and extrahepatic biliary ductal dilatation with 11 mm round density in the distal CBD.  MRCP was then obtained and showed choledocholithiasis with dilated ducts. GI, Dr. Tomasa Rand, was consulted and requested transfer to [facility] for ERCP.     Assessment/Plan: Choledocholithiasis -CT abdomen/pelvis and MRCP as discussed above -Start IV fluids -Continue judicious opioids -Clear liquid diet for now, npo after MN -GI (Dr. Pierce Crane to [facility] for ERCP -start empiric zosyn given fever and elevated LFTs  Essential hypertension -Continue atenolol  CKD stage IIIb -Baseline creatinine 1.1-1.4 -Monitor BMP  Mixed hyperlipidemia -Holding statin secondary to elevated LFTs  Hypertrophic cardiomyopathy -follows Dr. Izora Ribas -stable hemodynamically  Coronary artery disease -No chest pain presently -Patient has nonobstructive disease from review of the medical record  Class 2 obesity -BMI  37.95 -lifestyle modification       Family Communication:  no Family at bedside  Consultants:  [facility]  Code Status:  FULL  DVT Prophylaxis:  [place] Heparin    Procedures: As Listed in Progress Note Above  Antibiotics: None    Total time spent 50 minutes.  Greater than 50% spent face to face counseling and coordinating care.       Subjective: Patient denies fevers, chills, headache, chest pain, dyspnea, nausea, vomiting, diarrhea, abdominal pain, dysuria, hematuria, hematochezia, and melena.   Objective: Vitals:   06/16/23 0254 06/16/23 0649 06/16/23 0840 06/16/23 1014  BP: (!) 168/77 121/63 (!) 111/59 104/61  Pulse: 100 93 77 73  Resp: 16 18 18    Temp: (!) 100.4 F (38 C) 99.8 F (37.7 C) 99.3 F (37.4 C)   TempSrc: Oral Oral Oral   SpO2: 97% 95% 96%   Weight: 109.9 kg     Height:       No intake or output data in the 24 hours ending 06/16/23 1041 Weight change:  Exam:  General:  Pt is alert, follows commands appropriately, not in acute distress HEENT: No icterus, No thrush, No neck mass, [place]/AT Cardiovascular: RRR, S1/S2, no rubs, no gallops Respiratory: CTA bilaterally, no wheezing, no crackles, no rhonchi Abdomen: Soft/+BS, non tender, non distended, no guarding Extremities: No edema, No lymphangitis, No petechiae, No rashes, no synovitis   Data Reviewed: I have personally reviewed following labs and imaging studies Basic Metabolic Panel: Recent Labs  Lab 06/15/23 2248  NA 136  K 3.8  CL 103  CO2 26  GLUCOSE 151*  BUN 20  CREATININE 1.32*  CALCIUM 9.4   Liver Function Tests: Recent Labs  Lab 06/15/23 2248  AST 236*  ALT 184*  ALKPHOS 125  BILITOT 1.4*  PROT 7.9  ALBUMIN 4.1   Recent Labs  Lab 06/15/23 2248  LIPASE 44   No results for input(s): "AMMONIA" in the last 168 hours. Coagulation Profile: No results for input(s): "INR", "PROTIME" in the last 168 hours. CBC: Recent Labs  Lab 06/15/23 2248 06/16/23 1032  WBC  6.6 19.1*  NEUTROABS 5.3  --   HGB 14.2 13.4  HCT 43.9 40.0  MCV 92.6 90.9  PLT 158 147*   Cardiac Enzymes: No results for input(s): "CKTOTAL", "CKMB", "CKMBINDEX", "TROPONINI" in the last 168 hours. BNP: Invalid input(s): "POCBNP" CBG: No results for input(s): "GLUCAP" in the last 168 hours. HbA1C: No results for input(s): "HGBA1C" in the last 72 hours. Urine analysis:    Component Value Date/Time   COLORURINE AMBER (A) 03/06/2021 0351   APPEARANCEUR CLOUDY (A) 03/06/2021 0351   LABSPEC 1.016 03/06/2021 0351   PHURINE 5.0 03/06/2021 0351   GLUCOSEU NEGATIVE 03/06/2021 0351   HGBUR SMALL (A) 03/06/2021 0351   BILIRUBINUR NEGATIVE 03/06/2021 0351   KETONESUR NEGATIVE 03/06/2021 0351   PROTEINUR NEGATIVE 03/06/2021 0351   UROBILINOGEN 0.2 10/13/2012 2021   NITRITE NEGATIVE 03/06/2021 0351   LEUKOCYTESUR LARGE (A) 03/06/2021 0351   Sepsis Labs: @LABRCNTIP (procalcitonin:4,lacticidven:4) )No results found for this or any previous visit (from the past 240 hours).   Scheduled Meds:  atenolol  50 mg Oral Daily   heparin  5,000 Units Subcutaneous Q8H   pantoprazole  40 mg Oral Daily   rosuvastatin  5 mg Oral Daily   Continuous Infusions:  sodium chloride      Procedures/Studies: MR ABDOMEN MRCP WO CONTRAST Result Date: 06/16/2023 CLINICAL DATA:  67 year old female with clinical concern for biliary tract obstruction. EXAM: MRI ABDOMEN WITHOUT CONTRAST  (INCLUDING MRCP) TECHNIQUE: Multiplanar multisequence MR imaging of the abdomen was performed. Heavily T2-weighted images of the biliary and pancreatic ducts were obtained, and three-dimensional MRCP images were rendered by post processing. COMPARISON:  Abdominal MRI 03/02/2021. CT of the abdomen and pelvis 06/15/2023. FINDINGS: Comment: Today's study is limited for detection and characterization of visceral and/or vascular lesions by lack of IV gadolinium. Lower chest: Small hiatal hernia. Hepatobiliary: Diffuse loss of signal  intensity throughout the hepatic parenchyma on out of phase dual echo images, indicative of a background of hepatic steatosis. No suspicious cystic or solid hepatic lesions. Status post cholecystectomy. MRCP images demonstrate moderate intrahepatic biliary ductal dilatation. Severe dilatation of the common bile duct which measures up to 13 mm in diameter proximally. Multiple filling defects within the distal common bile duct, largest of which measures up to 1.3 cm in diameter. Pancreas: No definite pancreatic mass confidently identified on today's noncontrast examination. No pancreatic ductal dilatation. No pancreatic or peripancreatic fluid collections or inflammatory changes. Spleen:  Unremarkable. Adrenals/Urinary Tract: Multiple T1 hypointense, T2 hyperintense lesions in both kidneys, incompletely characterized on today's noncontrast CT examination, but statistically likely cysts (no imaging follow-up recommended) measuring up to 4.4 cm in the interpolar region of the left kidney. No hydroureteronephrosis in the visualized portions of the abdomen. Bilateral adrenal glands are normal in appearance. Stomach/Bowel: Colonic diverticulosis. Vascular/Lymphatic: No definite aneurysm identified in the visualized abdominal vasculature. No lymphadenopathy noted in the abdomen. Other: No significant volume of ascites noted in the visualized portions of the peritoneal cavity. Musculoskeletal: No aggressive appearing osseous lesions are noted in the visualized portions of the skeleton. IMPRESSION: 1. Study is positive for choledocholithiasis with intra and extrahepatic biliary ductal  dilatation indicating biliary obstruction. 2. Hepatic steatosis. 3. Colonic diverticulosis. 4. Additional incidental findings, as above. Electronically Signed   By: Trudie Reed M.D.   On: 06/16/2023 08:54   CT ABDOMEN PELVIS W CONTRAST Result Date: 06/16/2023 CLINICAL DATA:  LUQ abdominal pain c/o chest pain that began 2 hrs PTA. Pt  reports pain is generalized and radiates to her back EXAM: CT ABDOMEN AND PELVIS WITH CONTRAST TECHNIQUE: Multidetector CT imaging of the abdomen and pelvis was performed using the standard protocol following bolus administration of intravenous contrast. RADIATION DOSE REDUCTION: This exam was performed according to the departmental dose-optimization program which includes automated exposure control, adjustment of the mA and/or kV according to patient size and/or use of iterative reconstruction technique. CONTRAST:  80mL OMNIPAQUE IOHEXOL 300 MG/ML  SOLN COMPARISON:  CT abdomen pelvis 03/02/2021 FINDINGS: Lower chest: Small hiatal hernia.  No acute abnormality. Hepatobiliary: The liver is enlarged measuring up to 22 cm. No focal liver abnormality. Status post cholecystectomy. Intra and extrahepatic biliary dilatation. Rounded density measuring 11 mm at the distal common bile duct. Pancreas: No focal lesion. Normal pancreatic contour. No surrounding inflammatory changes. Stable proximal pancreatic duct focal dilatation up to 6 mm. Spleen: Normal in size without focal abnormality. Adrenals/Urinary Tract: No adrenal nodule bilaterally. Bilateral kidneys enhance symmetrically. Fluid dense lesions of the kidneys likely represent simple renal cysts. Simple renal cysts, in the absence of clinically indicated signs/symptoms, require no independent follow-up. No hydronephrosis. No hydroureter. The urinary bladder is unremarkable. On delayed imaging, there is no urothelial wall thickening and there are no filling defects in the opacified portions of the bilateral collecting systems or ureters. Stomach/Bowel: Stomach is within normal limits. No evidence of bowel wall thickening or dilatation. Colonic diverticulosis. Appendix appears normal. Vascular/Lymphatic: No abdominal aorta or iliac aneurysm. Severe atherosclerotic plaque of the aorta and its branches. No abdominal, pelvic, or inguinal lymphadenopathy. Reproductive:  Uterus and bilateral adnexa are unremarkable. Bilateral Bartholin cysts again noted (2:87) Other: No intraperitoneal free fluid. No intraperitoneal free gas. No organized fluid collection. Musculoskeletal: No abdominal wall hernia or abnormality.  Diastasis rectus. No suspicious lytic or blastic osseous lesions. No acute displaced fracture. IMPRESSION: 1. Interval development of intra and extrahepatic biliary ductal dilatation with a 11 mm rounded density in the distal common bile duct. Status post cholecystectomy. Findings suggestive of obstructive choledocholithiasis with underlying soft tissue mass not fully excluded. 2. Hepatomegaly. 3. Small hiatal hernia. 4. Colonic diverticulosis with no acute diverticulitis. 5. Bilateral Bartholin cysts. 6.  Aortic Atherosclerosis (ICD10-I70.0). Electronically Signed   By: Tish Frederickson M.D.   On: 06/16/2023 00:03   DG Chest 2 View Result Date: 06/15/2023 CLINICAL DATA:  Chest pain. EXAM: CHEST - 2 VIEW COMPARISON:  01/08/2018 FINDINGS: Stable upper normal heart size.The cardiomediastinal contours are normal. The lungs are clear. Pulmonary vasculature is normal. No consolidation, pleural effusion, or pneumothorax. No acute osseous abnormalities are seen. IMPRESSION: No acute chest findings. Electronically Signed   By: Narda Rutherford M.D.   On: 06/15/2023 22:36    Catarina Hartshorn, DO  [facility]  If 7PM-7AM, please contact night-coverage [email or web address] Password [email or web address] 06/16/2023, 10:41 AM   LOS: 0 days

## 2023-06-16 NOTE — Plan of Care (Signed)
  Problem: Pain Management: Goal: General experience of comfort will improve Outcome: Progressing   Problem: Safety: Goal: Ability to remain free from injury will improve Outcome: Progressing

## 2023-06-16 NOTE — H&P (Signed)
History and Physical    Patient: Traci Chandler URK:[id number] DOB: 04/21/1956 DOA: 06/15/2023 DOS: the patient was seen and examined on 06/16/2023 PCP: Irena Reichmann, DO  Patient coming from: Home  Chief Complaint:  Chief Complaint  Patient presents with   Chest Pain   HPI: Traci Chandler is a 67 y.o. female with medical history significant of hypertension, GERD, hyperlipidemia, hypertrophic nonobstructive cardiomyopathy who presents to the emergency department due to abdominal pain described as pressure sensation in the epigastric and lower sternal area with radiation to the back which started yesterday around 9 PM.  This was associated with nausea and vomiting.  Patient states that she had similar presentation about a week ago but self resolved.  She denies chest pain, shortness of breath, diaphoresis.  ED Course:  In the emergency department, BP was 183/83, other vital signs were within normal range.  Workup in the ED showed normal CBC and BMP except for blood glucose of 151 and creatinine of 1.32 (creatinine is within baseline range)..  Troponin x 2 was normal.  Lipase 44. CT abdomen and pelvis with contrast showed interval development of intra and extrahepatic biliary ductal dilatation with  a 11 mm rounded density in the distal common bile duct. Status post cholecystectomy. Findings suggestive of obstructive choledocholithiasis with underlying soft tissue mass not fully excluded. Hepatomegaly. Small hiatal hernia. Colonic diverticulosis with no acute diverticulitis. Bilateral Bartholin cysts. Chest x-ray showed no acute chest findings Patient was treated with morphine and Zofran.  Gastroenterologist was consulted and recommended MRCP in the morning with decision to send patient to [facility]CP based on findings. Hospitalist was asked to admit patient for further evaluation and management.   Review of Systems: Review of systems as noted in the HPI. All other systems reviewed and are  negative.   Past Medical History:  Diagnosis Date   Essential hypertension    GERD (gastroesophageal reflux disease)    Hypertrophic non-obstructive cardiomyopathy (HCC)    Echocardiogram 2011   Past Surgical History:  Procedure Laterality Date   ABDOMINAL HYSTERECTOMY     CHOLECYSTECTOMY N/A 03/07/2021   Procedure: LAPAROSCOPIC CHOLECYSTECTOMY;  Surgeon: [name], MD;  Location: [facility] ORS;  Service: General;  Laterality: N/A;   COLONOSCOPY N/A 04/15/2013   Procedure: COLONOSCOPY;  Surgeon: West Bali, MD;  Location: [facility] ENDO SUITE;  Service: Endoscopy;  Laterality: N/A;  9:30    Social History:  reports that she has never smoked. She has never used smokeless tobacco. She reports that she does not drink alcohol and does not use drugs.   Allergies  Allergen Reactions   Codeine Nausea And Vomiting   Diclofenac Other (See Comments)   Lisinopril Other (See Comments)    Family History  Problem Relation Age of Onset   Diabetes type II Mother    Hypertension Mother    Diabetes type II Sister    Hypertension Sister    Prostate cancer Father    Colon cancer Neg Hx      Prior to Admission medications   Medication Sig Start Date End Date Taking? Authorizing Provider  aspirin 81 MG tablet Take 81 mg by mouth every evening.     [provider]  atenolol (TENORMIN) 50 MG tablet Take 1 tablet by mouth daily. 02/11/18   [provider]  NIFEdipine (PROCARDIA XL/NIFEDICAL XL) 60 MG 24 hr tablet Take 60 mg by mouth daily. 07/09/19   [provider]  omeprazole (PRILOSEC) 20 MG capsule TAKE 1  CAPSULE 2 TIMES A DAY BEFORE A MEAL. 08/10/19   Anice Paganini, NP  polyethylene glycol (MIRALAX / GLYCOLAX) 17 g packet Take 17 g by mouth daily.    [provider]  predniSONE (DELTASONE) 50 MG tablet Take 1 tablet daily with breakfast for the next 5 days. 03/29/23   Leath-Warren, Sadie Haber, NP  rosuvastatin (CRESTOR) 5 MG tablet Take 1 tablet (5 mg total)  by mouth daily. 05/13/23   Christell Constant, MD  spironolactone (ALDACTONE) 25 MG tablet Take 25 mg by mouth 2 (two) times daily.    [provider]  traMADol (ULTRAM) 50 MG tablet as needed for severe pain. 07/02/19   [provider]  triamcinolone cream (KENALOG) 0.1 % as needed (rash). 01/24/21   [provider]    Physical Exam: BP 121/63 (BP Location: Left Arm)   Pulse 93   Temp 99.8 F (37.7 C) (Oral)   Resp 18   Ht 5\' 7"  (1.702 m)   Wt 113 kg   SpO2 95%   BMI 39.02 kg/m   General: 67 y.o. year-old female well developed well nourished in no acute distress.  Alert and oriented x3. HEENT: NCAT, EOMI Neck: Supple, trachea medial Cardiovascular: Regular rate and rhythm with no rubs or gallops.  No thyromegaly or JVD noted.  No lower extremity edema. 2/4 pulses in all 4 extremities. Respiratory: Clear to auscultation with no wheezes or rales. Good inspiratory effort. Abdomen: Soft, tender to palpation of upper quadrants without guarding.  Normal bowel sounds x4 quadrants. Muskuloskeletal: No cyanosis, clubbing or edema noted bilaterally Neuro: CN II-XII intact, strength 5/5 x 4, sensation, reflexes intact Skin: No ulcerative lesions noted or rashes Psychiatry: Judgement and insight appear normal. Mood is appropriate for condition and setting          Labs on Admission:  Basic Metabolic Panel: Recent Labs  Lab 06/15/23 2248  NA 136  K 3.8  CL 103  CO2 26  GLUCOSE 151*  BUN 20  CREATININE 1.32*  CALCIUM 9.4   Liver Function Tests: Recent Labs  Lab 06/15/23 2248  AST 236*  ALT 184*  ALKPHOS 125  BILITOT 1.4*  PROT 7.9  ALBUMIN 4.1   Recent Labs  Lab 06/15/23 2248  LIPASE 44   No results for input(s): "AMMONIA" in the last 168 hours. CBC: Recent Labs  Lab 06/15/23 2248  WBC 6.6  NEUTROABS 5.3  HGB 14.2  HCT 43.9  MCV 92.6  PLT 158   Cardiac Enzymes: No results for input(s): "CKTOTAL", "CKMB", "CKMBINDEX",  "TROPONINI" in the last 168 hours.  BNP (last 3 results) No results for input(s): "BNP" in the last 8760 hours.  ProBNP (last 3 results) No results for input(s): "PROBNP" in the last 8760 hours.  CBG: No results for input(s): "GLUCAP" in the last 168 hours.  Radiological Exams on Admission: CT ABDOMEN PELVIS W CONTRAST Result Date: 06/16/2023 CLINICAL DATA:  LUQ abdominal pain c/o chest pain that began 2 hrs PTA. Pt reports pain is generalized and radiates to her back EXAM: CT ABDOMEN AND PELVIS WITH CONTRAST TECHNIQUE: Multidetector CT imaging of the abdomen and pelvis was performed using the standard protocol following bolus administration of intravenous contrast. RADIATION DOSE REDUCTION: This exam was performed according to the departmental dose-optimization program which includes automated exposure control, adjustment of the mA and/or kV according to patient size and/or use of iterative reconstruction technique. CONTRAST:  80mL OMNIPAQUE IOHEXOL 300 MG/ML  SOLN COMPARISON:  CT abdomen  pelvis 03/02/2021 FINDINGS: Lower chest: Small hiatal hernia.  No acute abnormality. Hepatobiliary: The liver is enlarged measuring up to 22 cm. No focal liver abnormality. Status post cholecystectomy. Intra and extrahepatic biliary dilatation. Rounded density measuring 11 mm at the distal common bile duct. Pancreas: No focal lesion. Normal pancreatic contour. No surrounding inflammatory changes. Stable proximal pancreatic duct focal dilatation up to 6 mm. Spleen: Normal in size without focal abnormality. Adrenals/Urinary Tract: No adrenal nodule bilaterally. Bilateral kidneys enhance symmetrically. Fluid dense lesions of the kidneys likely represent simple renal cysts. Simple renal cysts, in the absence of clinically indicated signs/symptoms, require no independent follow-up. No hydronephrosis. No hydroureter. The urinary bladder is unremarkable. On delayed imaging, there is no urothelial wall thickening and there  are no filling defects in the opacified portions of the bilateral collecting systems or ureters. Stomach/Bowel: Stomach is within normal limits. No evidence of bowel wall thickening or dilatation. Colonic diverticulosis. Appendix appears normal. Vascular/Lymphatic: No abdominal aorta or iliac aneurysm. Severe atherosclerotic plaque of the aorta and its branches. No abdominal, pelvic, or inguinal lymphadenopathy. Reproductive: Uterus and bilateral adnexa are unremarkable. Bilateral Bartholin cysts again noted (2:87) Other: No intraperitoneal free fluid. No intraperitoneal free gas. No organized fluid collection. Musculoskeletal: No abdominal wall hernia or abnormality.  Diastasis rectus. No suspicious lytic or blastic osseous lesions. No acute displaced fracture. IMPRESSION: 1. Interval development of intra and extrahepatic biliary ductal dilatation with a 11 mm rounded density in the distal common bile duct. Status post cholecystectomy. Findings suggestive of obstructive choledocholithiasis with underlying soft tissue mass not fully excluded. 2. Hepatomegaly. 3. Small hiatal hernia. 4. Colonic diverticulosis with no acute diverticulitis. 5. Bilateral Bartholin cysts. 6.  Aortic Atherosclerosis (ICD10-I70.0). Electronically Signed   By: Tish Frederickson M.D.   On: 06/16/2023 00:03   DG Chest 2 View Result Date: 06/15/2023 CLINICAL DATA:  Chest pain. EXAM: CHEST - 2 VIEW COMPARISON:  01/08/2018 FINDINGS: Stable upper normal heart size.The cardiomediastinal contours are normal. The lungs are clear. Pulmonary vasculature is normal. No consolidation, pleural effusion, or pneumothorax. No acute osseous abnormalities are seen. IMPRESSION: No acute chest findings. Electronically Signed   By: Narda Rutherford M.D.   On: 06/15/2023 22:36    EKG: I independently viewed the EKG done and my findings are as followed: Normal sinus rhythm with first-degree AV block at a rate of 82 bpm  Assessment/Plan Present on  Admission:  Choledocholithiasis  Abdominal pain  Mixed hyperlipidemia  GERD (gastroesophageal reflux disease)  Essential hypertension, benign  Principal Problem:   Choledocholithiasis Active Problems:   Hypertrophic cardiomyopathy (HCC)   Essential hypertension, benign   GERD (gastroesophageal reflux disease)   Abdominal pain   Mixed hyperlipidemia   Nausea & vomiting   Chronic kidney disease, stage 3b (HCC)  Abdominal pain, nausea, vomiting possibly due to acute choledocholithiasis Patient will be admitted to [facility] Continue IV NS at 100 mLs/Hr Continue IV Dilaudid 0.5 mg q.3h p.r.n. for moderate to severe pain Continue IV Zofran p.r.n. Continue NPO MRCP in the morning  Transaminitis possibly secondary to above AST 236, ALT 184, ALP 125 Patient already had cholecystectomy MRCP in the morning as described above Continue to trend liver enzymes  Hyperglycemia possibly reactive Blood glucose at 151, continue to monitor blood glucose with morning labs.  CKD 3B Creatinine at 1.32 (this is within baseline range) Renally adjust medications, avoid nephrotoxic agents/dehydration/hypotension  Essential hypertension Continue atenolol  Mixed hyperlipidemia Continue Crestor  GERD Continue Protonix  Hypertrophic cardiomyopathy Stable.  Patient  denies chest pain/shortness of breath  DVT prophylaxis: Subcu heparin  Code Status: Full code  Family Communication: None at bedside  Consults: None  Severity of Illness: The appropriate patient status for this patient is INPATIENT. Inpatient status is judged to be reasonable and necessary in order to provide the required intensity of service to ensure the patient's safety. The patient's presenting symptoms, physical exam findings, and initial radiographic and laboratory data in the context of their chronic comorbidities is felt to place them at high risk for further clinical deterioration. Furthermore, it is not anticipated that the  patient will be medically stable for discharge from the hospital within 2 midnights of admission.   * I certify that at the point of admission it is my clinical judgment that the patient will require inpatient hospital care spanning beyond 2 midnights from the point of admission due to high intensity of service, high risk for further deterioration and high frequency of surveillance required.*  Author: Frankey Shown, DO 06/16/2023 6:57 AM  For on call review [email or web address].

## 2023-06-16 NOTE — Progress Notes (Signed)
Patient report given to [facility] staff. Advises approx 10 - 15 minutes out. Pt updated on transport arrival time, states understanding.

## 2023-06-16 NOTE — Progress Notes (Signed)
Pt transported to MRI. Kellogg RN

## 2023-06-16 NOTE — Plan of Care (Signed)
°  Problem: Elimination: Goal: Will not experience complications related to bowel motility Outcome: Progressing   Problem: Nutrition: Goal: Adequate nutrition will be maintained Outcome: Progressing   Problem: Pain Management: Goal: General experience of comfort will improve Outcome: Progressing   Problem: Safety: Goal: Ability to remain free from injury will improve Outcome: Progressing

## 2023-06-16 NOTE — Plan of Care (Signed)
  Problem: Activity: Goal: Risk for activity intolerance will decrease Outcome: Progressing   

## 2023-06-17 ENCOUNTER — Inpatient Hospital Stay (HOSPITAL_COMMUNITY): Payer: Medicare Other

## 2023-06-17 ENCOUNTER — Encounter (HOSPITAL_COMMUNITY): Payer: Self-pay | Admitting: Internal Medicine

## 2023-06-17 ENCOUNTER — Encounter (HOSPITAL_COMMUNITY)
Admission: EM | Disposition: A | Discharge status: Home / Self Care | Payer: Self-pay | Source: Home / Self Care | Attending: Internal Medicine

## 2023-06-17 ENCOUNTER — Inpatient Hospital Stay (HOSPITAL_COMMUNITY): Payer: Medicare Other | Admitting: Anesthesiology

## 2023-06-17 DIAGNOSIS — N1832 Chronic kidney disease, stage 3b: Secondary | ICD-10-CM | POA: Diagnosis not present

## 2023-06-17 DIAGNOSIS — K838 Other specified diseases of biliary tract: Secondary | ICD-10-CM | POA: Diagnosis not present

## 2023-06-17 DIAGNOSIS — R17 Unspecified jaundice: Secondary | ICD-10-CM

## 2023-06-17 DIAGNOSIS — R7989 Other specified abnormal findings of blood chemistry: Secondary | ICD-10-CM | POA: Diagnosis not present

## 2023-06-17 DIAGNOSIS — K5710 Diverticulosis of small intestine without perforation or abscess without bleeding: Secondary | ICD-10-CM

## 2023-06-17 DIAGNOSIS — R109 Unspecified abdominal pain: Secondary | ICD-10-CM | POA: Diagnosis not present

## 2023-06-17 DIAGNOSIS — R112 Nausea with vomiting, unspecified: Secondary | ICD-10-CM | POA: Diagnosis not present

## 2023-06-17 DIAGNOSIS — K8050 Calculus of bile duct without cholangitis or cholecystitis without obstruction: Secondary | ICD-10-CM

## 2023-06-17 DIAGNOSIS — I10 Essential (primary) hypertension: Secondary | ICD-10-CM | POA: Diagnosis not present

## 2023-06-17 HISTORY — PX: REMOVAL OF STONES: SHX5545

## 2023-06-17 HISTORY — PX: BILIARY STENT PLACEMENT: SHX5538

## 2023-06-17 HISTORY — PX: ERCP: SHX5425

## 2023-06-17 HISTORY — PX: SPHINCTEROTOMY: SHX5279

## 2023-06-17 LAB — CBC
HCT: 36.8 % (ref 36.0–46.0)
Hemoglobin: 12.3 g/dL (ref 12.0–15.0)
MCH: 30 pg (ref 26.0–34.0)
MCHC: 33.4 g/dL (ref 30.0–36.0)
MCV: 89.8 fL (ref 80.0–100.0)
Platelets: 120 10*3/uL — ABNORMAL LOW (ref 150–400)
RBC: 4.1 MIL/uL (ref 3.87–5.11)
RDW: 13 % (ref 11.5–15.5)
WBC: 9.2 10*3/uL (ref 4.0–10.5)
nRBC: 0 % (ref 0.0–0.2)

## 2023-06-17 LAB — PHOSPHORUS: Phosphorus: 2.2 mg/dL — ABNORMAL LOW (ref 2.5–4.6)

## 2023-06-17 LAB — COMPREHENSIVE METABOLIC PANEL
ALT: 182 U/L — ABNORMAL HIGH (ref 0–44)
AST: 109 U/L — ABNORMAL HIGH (ref 15–41)
Albumin: 3.1 g/dL — ABNORMAL LOW (ref 3.5–5.0)
Alkaline Phosphatase: 97 U/L (ref 38–126)
Anion gap: 10 (ref 5–15)
BUN: 20 mg/dL (ref 8–23)
CO2: 24 mmol/L (ref 22–32)
Calcium: 9 mg/dL (ref 8.9–10.3)
Chloride: 103 mmol/L (ref 98–111)
Creatinine, Ser: 1.68 mg/dL — ABNORMAL HIGH (ref 0.44–1.00)
GFR, Estimated: 33 mL/min — ABNORMAL LOW (ref >60)
Glucose, Bld: 108 mg/dL — ABNORMAL HIGH (ref 70–99)
Potassium: 3.8 mmol/L (ref 3.5–5.1)
Sodium: 137 mmol/L (ref 135–145)
Total Bilirubin: 5.3 mg/dL — ABNORMAL HIGH (ref <1.2)
Total Protein: 6.6 g/dL (ref 6.5–8.1)

## 2023-06-17 LAB — HIV ANTIBODY (ROUTINE TESTING W REFLEX): HIV Screen 4th Generation wRfx: NONREACTIVE

## 2023-06-17 LAB — MAGNESIUM: Magnesium: 1.9 mg/dL (ref 1.7–2.4)

## 2023-06-17 SURGERY — ERCP, WITH INTERVENTION IF INDICATED
Anesthesia: General

## 2023-06-17 MED ORDER — PIPERACILLIN-TAZOBACTAM 3.375 G IVPB 30 MIN
INTRAVENOUS | Status: AC | PRN
  Administered 2023-06-17: 3.375 g via INTRAVENOUS

## 2023-06-17 MED ORDER — PROPOFOL 10 MG/ML IV BOLUS
INTRAVENOUS | Status: DC | PRN
  Administered 2023-06-17: 200 mg via INTRAVENOUS

## 2023-06-17 MED ORDER — SODIUM CHLORIDE 0.9 % IV SOLN: INTRAVENOUS | Status: DC

## 2023-06-17 MED ORDER — SODIUM CHLORIDE 0.9 % IV SOLN
INTRAVENOUS | Status: DC | PRN
  Administered 2023-06-17: 50 mL

## 2023-06-17 MED ORDER — GUAIFENESIN-DM 100-10 MG/5ML PO SYRP
5.0000 mL | ORAL_SOLUTION | ORAL | Status: DC | PRN
  Filled 2023-06-17: qty 5

## 2023-06-17 MED ORDER — FENTANYL CITRATE (PF) 100 MCG/2ML IJ SOLN
INTRAMUSCULAR | Status: AC
  Filled 2023-06-17: qty 2

## 2023-06-17 MED ORDER — SUCCINYLCHOLINE CHLORIDE 200 MG/10ML IV SOSY
PREFILLED_SYRINGE | INTRAVENOUS | Status: DC | PRN
  Administered 2023-06-17: 120 mg via INTRAVENOUS

## 2023-06-17 MED ORDER — PHENYLEPHRINE HCL-NACL 20-0.9 MG/250ML-% IV SOLN
INTRAVENOUS | Status: DC | PRN
  Administered 2023-06-17: 40 ug/min via INTRAVENOUS

## 2023-06-17 MED ORDER — INDOMETHACIN 50 MG RE SUPP
RECTAL | Status: DC | PRN
  Administered 2023-06-17: 100 mg via RECTAL

## 2023-06-17 MED ORDER — SALINE SPRAY 0.65 % NA SOLN
1.0000 | NASAL | Status: DC | PRN
  Administered 2023-06-17: 1 via NASAL
  Filled 2023-06-17: qty 44

## 2023-06-17 MED ORDER — LIDOCAINE 2% (20 MG/ML) 5 ML SYRINGE
INTRAMUSCULAR | Status: DC | PRN
  Administered 2023-06-17: 100 mg via INTRAVENOUS

## 2023-06-17 MED ORDER — MENTHOL 3 MG MT LOZG
1.0000 | LOZENGE | OROMUCOSAL | Status: DC | PRN
  Administered 2023-06-17: 3 mg via ORAL
  Filled 2023-06-17: qty 9

## 2023-06-17 MED ORDER — INDOMETHACIN 50 MG RE SUPP
RECTAL | Status: AC
Start: 2023-06-17 — End: ?
  Filled 2023-06-17: qty 2

## 2023-06-17 MED ORDER — DEXAMETHASONE SODIUM PHOSPHATE 10 MG/ML IJ SOLN
INTRAMUSCULAR | Status: DC | PRN
  Administered 2023-06-17: 10 mg via INTRAVENOUS

## 2023-06-17 MED ORDER — GLUCAGON HCL RDNA (DIAGNOSTIC) 1 MG IJ SOLR
INTRAMUSCULAR | Status: DC | PRN
  Administered 2023-06-17 (×3): .25 mg via INTRAVENOUS

## 2023-06-17 MED ORDER — SUGAMMADEX SODIUM 200 MG/2ML IV SOLN
INTRAVENOUS | Status: DC | PRN
  Administered 2023-06-17: 200 mg via INTRAVENOUS

## 2023-06-17 MED ORDER — FENTANYL CITRATE (PF) 250 MCG/5ML IJ SOLN
INTRAMUSCULAR | Status: DC | PRN
  Administered 2023-06-17 (×2): 50 ug via INTRAVENOUS

## 2023-06-17 MED ORDER — ROCURONIUM BROMIDE 10 MG/ML (PF) SYRINGE
PREFILLED_SYRINGE | INTRAVENOUS | Status: DC | PRN
  Administered 2023-06-17: 40 mg via INTRAVENOUS

## 2023-06-17 MED ORDER — ONDANSETRON HCL 4 MG/2ML IJ SOLN
INTRAMUSCULAR | Status: DC | PRN
  Administered 2023-06-17: 4 mg via INTRAVENOUS

## 2023-06-17 MED ORDER — GLUCAGON HCL RDNA (DIAGNOSTIC) 1 MG IJ SOLR
INTRAMUSCULAR | Status: AC
Start: 2023-06-17 — End: ?
  Filled 2023-06-17: qty 1

## 2023-06-17 NOTE — Assessment & Plan Note (Addendum)
06-17-2023 stable. Remains on atenolol 06-18-2023 stable.

## 2023-06-17 NOTE — Assessment & Plan Note (Signed)
BMI 37.95

## 2023-06-17 NOTE — Progress Notes (Signed)
PROGRESS NOTE    Traci Chandler  QMV:[id number] DOB: 30-Oct-1955 DOA: 06/15/2023 PCP: Irena Reichmann, DO  Subjective: Pt seen and examined this AM prior to EGD.   Hospital Course: HPI: 67 year old female with a history of hypertension, hypertrophic cardiomyopathy, hyperlipidemia presenting with upper abdominal pain with associated nausea and vomiting in the early morning 06/16/2023.  The patient had a similar episode on 06/10/2023 with nausea and vomiting.  She stated that it improved spontaneously and did not have a recurrence until 06/16/2023.  She denies any new medications.  She denies any alcohol or illicit drug use.  She has had subjective fevers and chills at home.  She denies any headache, chest pain, shortness of breath, coughing. In the ED, the patient had low-grade temperature 100.4.  She was tachycardic in the 110.  She is hemodynamically stable with oxygen saturation 97% room air.   GI, Dr. Tomasa Rand, was consulted and requested transfer to [facility] for ERCP.     Significant Events: Admitted 06/15/2023 for choledocholithiasis   Significant Labs: WBC 6.6, hemoglobin 14.2, plates 284.  Sodium 136, potassium 3.8, bicarbonate 26, serum creatinine 1.32.  Significant Imaging Studies: CT abdomen and pelvis showed intra and extrahepatic biliary ductal dilatation with 11 mm round density in the distal CBD.   MRCP was then obtained and showed choledocholithiasis with dilated ducts.  Antibiotic Therapy: Anti-infectives (From admission, onward)    Start     Dose/Rate Route Frequency Ordered Stop   06/16/23 1400  [MAR Hold]  piperacillin-tazobactam (ZOSYN) IVPB 3.375 g        (MAR Hold since Mon 06/17/2023 at 1235.Hold Reason: Transfer to a Procedural area)   3.375 g 12.5 mL/hr over 240 Minutes Intravenous Every 8 hours 06/16/23 1052         Procedures:   Consultants:     Assessment and Plan: * Choledocholithiasis 06-17-2023 pt is s/p cholecystectomy in 02-2021. MRCP  shows stone in CBD. Going for ERCP today.  Nausea & vomiting 06-17-2023 due to choledocholithasis.  Essential hypertension, benign 06-17-2023 on atenolol 50 mg daily. Awaiting her diet to be upgraded and adequate oral intake before starting the rest of her HTN meds.  Hypertrophic cardiomyopathy (HCC) 06-17-2023 stable. Remains on atenolol  Obesity, Class II, BMI 35-39.9 BMI 37.95   DVT prophylaxis: SCDs Start: 06/16/23 1324     Code Status: Full Code Family Communication: no family at bedside Disposition Plan: return home Reason for continuing need for hospitalization: going for ERCP today.  Objective: Vitals:   06/16/23 2151 06/17/23 0314 06/17/23 0723 06/17/23 1237  BP: 136/61 125/61 (!) 126/56 (!) 155/69  Pulse: 78 81 70   Resp: 17 18 16 20   Temp: 100 F (37.8 C) 99.8 F (37.7 C) 98.4 F (36.9 C) 97.8 F (36.6 C)  TempSrc: Oral Oral Oral Temporal  SpO2: 97% 94% 96% 98%  Weight:      Height:        Intake/Output Summary (Last 24 hours) at 06/17/2023 1320 Last data filed at 06/17/2023 1200 Gross per 24 hour  Intake 389.94 ml  Output --  Net 389.94 ml   Filed Weights   06/15/23 2158 06/16/23 0254  Weight: 113 kg 109.9 kg    Examination:  Physical Exam Vitals and nursing note reviewed.  Constitutional:      General: She is not in acute distress.    Appearance: She is obese. She is not toxic-appearing or diaphoretic.  HENT:     Head: Normocephalic.  Eyes:  General: No scleral icterus. Cardiovascular:     Rate and Rhythm: Normal rate and regular rhythm.  Pulmonary:     Effort: Pulmonary effort is normal.     Breath sounds: Normal breath sounds.  Abdominal:     General: Bowel sounds are normal. There is no distension.     Palpations: Abdomen is soft.     Tenderness: There is abdominal tenderness in the right upper quadrant. There is no guarding or rebound.  Musculoskeletal:     Right lower leg: No edema.     Left lower leg: No edema.  Skin:     General: Skin is warm and dry.     Capillary Refill: Capillary refill takes less than 2 seconds.  Neurological:     General: No focal deficit present.     Mental Status: She is alert and oriented to person, place, and time.     Data Reviewed: I have personally reviewed following labs and imaging studies  CBC: Recent Labs  Lab 06/15/23 2248 06/16/23 1032 06/17/23 0606  WBC 6.6 19.1* 9.2  NEUTROABS 5.3  --   --   HGB 14.2 13.4 12.3  HCT 43.9 40.0 36.8  MCV 92.6 90.9 89.8  PLT 158 147* 120*   Basic Metabolic Panel: Recent Labs  Lab 06/15/23 2248 06/16/23 1032 06/17/23 0606  NA 136 136 137  K 3.8 4.5 3.8  CL 103 102 103  CO2 26 23 24   GLUCOSE 151* 122* 108*  BUN 20 25* 20  CREATININE 1.32* 1.63* 1.68*  CALCIUM 9.4 9.4 9.0  MG  --   --  1.9  PHOS  --   --  2.2*   GFR: Estimated Creatinine Clearance: 40.4 mL/min (A) (by C-G formula based on SCr of 1.68 mg/dL (H)). Liver Function Tests: Recent Labs  Lab 06/15/23 2248 06/16/23 1032 06/17/23 0606  AST 236* 238* 109*  ALT 184* 256* 182*  ALKPHOS 125 109 97  BILITOT 1.4* 3.0* 5.3*  PROT 7.9 7.3 6.6  ALBUMIN 4.1 3.6 3.1*   Recent Labs  Lab 06/15/23 2248  LIPASE 44    Radiology Studies: MR ABDOMEN MRCP WO CONTRAST Result Date: 06/16/2023 CLINICAL DATA:  67 year old female with clinical concern for biliary tract obstruction. EXAM: MRI ABDOMEN WITHOUT CONTRAST  (INCLUDING MRCP) TECHNIQUE: Multiplanar multisequence MR imaging of the abdomen was performed. Heavily T2-weighted images of the biliary and pancreatic ducts were obtained, and three-dimensional MRCP images were rendered by post processing. COMPARISON:  Abdominal MRI 03/02/2021. CT of the abdomen and pelvis 06/15/2023. FINDINGS: Comment: Today's study is limited for detection and characterization of visceral and/or vascular lesions by lack of IV gadolinium. Lower chest: Small hiatal hernia. Hepatobiliary: Diffuse loss of signal intensity throughout the hepatic  parenchyma on out of phase dual echo images, indicative of a background of hepatic steatosis. No suspicious cystic or solid hepatic lesions. Status post cholecystectomy. MRCP images demonstrate moderate intrahepatic biliary ductal dilatation. Severe dilatation of the common bile duct which measures up to 13 mm in diameter proximally. Multiple filling defects within the distal common bile duct, largest of which measures up to 1.3 cm in diameter. Pancreas: No definite pancreatic mass confidently identified on today's noncontrast examination. No pancreatic ductal dilatation. No pancreatic or peripancreatic fluid collections or inflammatory changes. Spleen:  Unremarkable. Adrenals/Urinary Tract: Multiple T1 hypointense, T2 hyperintense lesions in both kidneys, incompletely characterized on today's noncontrast CT examination, but statistically likely cysts (no imaging follow-up recommended) measuring up to 4.4 cm in the interpolar region of  the left kidney. No hydroureteronephrosis in the visualized portions of the abdomen. Bilateral adrenal glands are normal in appearance. Stomach/Bowel: Colonic diverticulosis. Vascular/Lymphatic: No definite aneurysm identified in the visualized abdominal vasculature. No lymphadenopathy noted in the abdomen. Other: No significant volume of ascites noted in the visualized portions of the peritoneal cavity. Musculoskeletal: No aggressive appearing osseous lesions are noted in the visualized portions of the skeleton. IMPRESSION: 1. Study is positive for choledocholithiasis with intra and extrahepatic biliary ductal dilatation indicating biliary obstruction. 2. Hepatic steatosis. 3. Colonic diverticulosis. 4. Additional incidental findings, as above. Electronically Signed   By: Trudie Reed M.D.   On: 06/16/2023 08:54   CT ABDOMEN PELVIS W CONTRAST Result Date: 06/16/2023 CLINICAL DATA:  LUQ abdominal pain c/o chest pain that began 2 hrs PTA. Pt reports pain is generalized and  radiates to her back EXAM: CT ABDOMEN AND PELVIS WITH CONTRAST TECHNIQUE: Multidetector CT imaging of the abdomen and pelvis was performed using the standard protocol following bolus administration of intravenous contrast. RADIATION DOSE REDUCTION: This exam was performed according to the departmental dose-optimization program which includes automated exposure control, adjustment of the mA and/or kV according to patient size and/or use of iterative reconstruction technique. CONTRAST:  80mL OMNIPAQUE IOHEXOL 300 MG/ML  SOLN COMPARISON:  CT abdomen pelvis 03/02/2021 FINDINGS: Lower chest: Small hiatal hernia.  No acute abnormality. Hepatobiliary: The liver is enlarged measuring up to 22 cm. No focal liver abnormality. Status post cholecystectomy. Intra and extrahepatic biliary dilatation. Rounded density measuring 11 mm at the distal common bile duct. Pancreas: No focal lesion. Normal pancreatic contour. No surrounding inflammatory changes. Stable proximal pancreatic duct focal dilatation up to 6 mm. Spleen: Normal in size without focal abnormality. Adrenals/Urinary Tract: No adrenal nodule bilaterally. Bilateral kidneys enhance symmetrically. Fluid dense lesions of the kidneys likely represent simple renal cysts. Simple renal cysts, in the absence of clinically indicated signs/symptoms, require no independent follow-up. No hydronephrosis. No hydroureter. The urinary bladder is unremarkable. On delayed imaging, there is no urothelial wall thickening and there are no filling defects in the opacified portions of the bilateral collecting systems or ureters. Stomach/Bowel: Stomach is within normal limits. No evidence of bowel wall thickening or dilatation. Colonic diverticulosis. Appendix appears normal. Vascular/Lymphatic: No abdominal aorta or iliac aneurysm. Severe atherosclerotic plaque of the aorta and its branches. No abdominal, pelvic, or inguinal lymphadenopathy. Reproductive: Uterus and bilateral adnexa are  unremarkable. Bilateral Bartholin cysts again noted (2:87) Other: No intraperitoneal free fluid. No intraperitoneal free gas. No organized fluid collection. Musculoskeletal: No abdominal wall hernia or abnormality.  Diastasis rectus. No suspicious lytic or blastic osseous lesions. No acute displaced fracture. IMPRESSION: 1. Interval development of intra and extrahepatic biliary ductal dilatation with a 11 mm rounded density in the distal common bile duct. Status post cholecystectomy. Findings suggestive of obstructive choledocholithiasis with underlying soft tissue mass not fully excluded. 2. Hepatomegaly. 3. Small hiatal hernia. 4. Colonic diverticulosis with no acute diverticulitis. 5. Bilateral Bartholin cysts. 6.  Aortic Atherosclerosis (ICD10-I70.0). Electronically Signed   By: Tish Frederickson M.D.   On: 06/16/2023 00:03   DG Chest 2 View Result Date: 06/15/2023 CLINICAL DATA:  Chest pain. EXAM: CHEST - 2 VIEW COMPARISON:  01/08/2018 FINDINGS: Stable upper normal heart size.The cardiomediastinal contours are normal. The lungs are clear. Pulmonary vasculature is normal. No consolidation, pleural effusion, or pneumothorax. No acute osseous abnormalities are seen. IMPRESSION: No acute chest findings. Electronically Signed   By: Narda Rutherford M.D.   On: 06/15/2023 22:36  Scheduled Meds:  [MAR Hold] atenolol  50 mg Oral Daily   [MAR Hold] pantoprazole  40 mg Oral Daily   Continuous Infusions:  sodium chloride     [MAR Hold] piperacillin-tazobactam (ZOSYN)  IV 3.375 g (06/17/23 0543)     LOS: 1 day   Time spent: 40 minutes  Carollee Herter, DO  [facility]  06/17/2023, 1:20 PM

## 2023-06-17 NOTE — Assessment & Plan Note (Addendum)
06-17-2023 pt is s/p cholecystectomy in 02-2021. MRCP shows stone in CBD. Going for ERCP today. 06-18-2023 had successful ERCP yesterday with multiple biliary stones removed and biliary stent placement. Pt will need f/u with GI for repeat EGD and biliary stent removal.  06-18-2023 GI requested that pt be sent home with short course of po abx. DC to home with 5 days of augmentin 875 mgm bid.

## 2023-06-17 NOTE — Progress Notes (Signed)
Pt returned to 6N12 via wheelchair from endo. Received report from [place], Charity fundraiser.

## 2023-06-17 NOTE — Transfer of Care (Signed)
Immediate Anesthesia Transfer of Care Note  Patient: Traci Chandler  Procedure(s) Performed: ENDOSCOPIC RETROGRADE CHOLANGIOPANCREATOGRAPHY (ERCP) SPHINCTEROTOMY SPHINCTEROPLASTY REMOVAL OF STONES BILIARY STENT PLACEMENT  Patient Location: PACU  Anesthesia Type:General  Level of Consciousness: sedated  Airway & Oxygen Therapy: Patient Spontanous Breathing  Post-op Assessment: Report given to RN  Post vital signs: Reviewed and stable  Last Vitals:  Vitals Value Taken Time  BP 140/71 06/17/23 1439  Temp    Pulse 69 06/17/23 1440  Resp 14 06/17/23 1440  SpO2 97 % 06/17/23 1440  Vitals shown include unfiled device data.  Last Pain:  Vitals:   06/17/23 1437  TempSrc:   PainSc: 0-No pain         Complications: No notable events documented.

## 2023-06-17 NOTE — Consult Note (Signed)
Consultation Note   Referring Provider:  [facility] PCP: Irena Reichmann, DO Primary Gastroenterologist: Previously Dr. Darrick Penna with [facility]        Reason for Consultation: CBD stone with obstruction  DOA: 06/15/2023         Hospital Day: 3   ASSESSMENT    Brief Narrative:  67 y.o. year old female with a history of GERD, HTN, HLD, nonobstructive cardiomyopathy,  CKD 3b. Troponin was normal. LFTs elevated, WBC 19K  CT [facility] suggested CBD stone with biliary duct dilation. Findings confirmed by MRCP. She was transferred to [facility]  Leukocytosis /  Acute thrombocytopenia,  WBC has normalized on Zosyn Platelets down further overnight but not critically low ( 147 >> 120)    Principal Problem:   Choledocholithiasis Active Problems:   Hypertrophic cardiomyopathy (HCC)   Essential hypertension, benign   GERD (gastroesophageal reflux disease)   Abdominal pain   Mixed hyperlipidemia   Nausea & vomiting   Chronic kidney disease, stage 3b (HCC)   Elevated liver function tests   Class 2 obesity     PLAN:   --ERCP today with Dr. Chales Abrahams. The benefits and risks of ERCP with possible sphincterotomy not limited to cardiopulmonary complications of sedation, bleeding, infection, perforation,and pancreatitis were discussed with the patient who agrees to proceed.   --SQ heparin listed on MAR as being given around 6 am but patient tells me she has not gotten it. Should be okay to proceed with ERCP but I will discontinue for now since next dose is at 2 pm  HPI   Rhythm presented to outside ED 12/28 with chest pain /epigastric pain radiating through to her back. Pain was reminiscent of when she had to have gallbladder removed.   Notable labs / Imaging / Events this admission  :  WBC 19K Hgb 12.3 Lipase 44 On admit: AST 236 / ALT 184, Tbili 1.4, alk phos 125   MRCP 1. Study is positive for choledocholithiasis with intra and extrahepatic biliary  ductal dilatation indicating biliary obstruction. 2. Hepatic steatosis. 3. Colonic diverticulosis. 4. Additional incidental findings, as above  Since receiving pain meds in ED she hasn't had a recurrence of pain.  Today AST 109 / ALT 182, alk phos 97, Bili 5.3, platelets 120.    Carisma has no chronic GI problems or complaints. No BM changes. No blood in stool.   Previous GI Evaluations   Oct 2014 Screening colonoscopy.  --moderate sized internal hemorrhoids. Mild diverticulosis of sigmoid colon.    Labs and Imaging: Recent Labs    06/15/23 2248 06/16/23 1032 06/17/23 0606  WBC 6.6 19.1* 9.2  HGB 14.2 13.4 12.3  HCT 43.9 40.0 36.8  PLT 158 147* 120*   Recent Labs    06/15/23 2248 06/16/23 1032 06/17/23 0606  NA 136 136 137  K 3.8 4.5 3.8  CL 103 102 103  CO2 26 23 24   GLUCOSE 151* 122* 108*  BUN 20 25* 20  CREATININE 1.32* 1.63* 1.68*  CALCIUM 9.4 9.4 9.0   Recent Labs    06/15/23 2248 06/16/23 1032 06/17/23 0606  PROT 7.9   < > 6.6  ALBUMIN 4.1   < > 3.1*  AST 236*   < > 109*  ALT  184*   < > 182*  ALKPHOS 125   < > 97  BILITOT 1.4*   < > 5.3*  BILIDIR 0.7*  --   --   IBILI 0.7  --   --    < > = values in this interval not displayed.   No results for input(s): "HEPBSAG", "HCVAB", "HEPAIGM", "HEPBIGM" in the last 72 hours. No results for input(s): "LABPROT", "INR" in the last 72 hours.    Past Medical History:  Diagnosis Date   Essential hypertension    GERD (gastroesophageal reflux disease)    Hypertrophic non-obstructive cardiomyopathy (HCC)    Echocardiogram 2011    Past Surgical History:  Procedure Laterality Date   ABDOMINAL HYSTERECTOMY     CHOLECYSTECTOMY N/A 03/07/2021   Procedure: LAPAROSCOPIC CHOLECYSTECTOMY;  Surgeon: [name], MD;  Location: [facility] ORS;  Service: General;  Laterality: N/A;   COLONOSCOPY N/A 04/15/2013   Procedure: COLONOSCOPY;  Surgeon: West Bali, MD;  Location: [facility] ENDO SUITE;  Service: Endoscopy;   Laterality: N/A;  9:30    Family History  Problem Relation Age of Onset   Diabetes type II Mother    Hypertension Mother    Diabetes type II Sister    Hypertension Sister    Prostate cancer Father    Colon cancer Neg Hx     Prior to Admission medications   Medication Sig Start Date End Date Taking? Authorizing Provider  aspirin 81 MG tablet Take 81 mg by mouth every evening.    Yes [provider]  atenolol (TENORMIN) 50 MG tablet Take 1 tablet by mouth daily. 02/11/18  Yes [provider]  NIFEdipine (PROCARDIA XL/NIFEDICAL XL) 60 MG 24 hr tablet Take 60 mg by mouth daily. 07/09/19  Yes [provider]  omeprazole (PRILOSEC) 20 MG capsule TAKE 1 CAPSULE 2 TIMES A DAY BEFORE A MEAL. Patient taking differently: Take 20 mg by mouth 2 (two) times daily before a meal. 08/10/19  Yes Anice Paganini, NP  spironolactone (ALDACTONE) 25 MG tablet Take 25 mg by mouth 2 (two) times daily.   Yes [provider]  traMADol (ULTRAM) 50 MG tablet as needed for severe pain. 07/02/19  Yes [provider]  polyethylene glycol (MIRALAX / GLYCOLAX) 17 g packet Take 17 g by mouth daily. Patient not taking: Reported on 06/16/2023    [provider]  predniSONE (DELTASONE) 50 MG tablet Take 1 tablet daily with breakfast for the next 5 days. Patient not taking: Reported on 06/16/2023 03/29/23   Leath-Warren, Sadie Haber, NP  rosuvastatin (CRESTOR) 5 MG tablet Take 1 tablet (5 mg total) by mouth daily. Patient not taking: Reported on 06/16/2023 05/13/23   Riley Lam A, MD  triamcinolone cream (KENALOG) 0.1 % as needed (rash). Patient not taking: Reported on 06/16/2023 01/24/21   [provider]    Current Facility-Administered Medications  Medication Dose Route Frequency Provider Last Rate Last Admin   atenolol (TENORMIN) tablet 50 mg  50 mg Oral Daily Tat, David, MD   50 mg at 06/16/23 1014   heparin injection 5,000 Units  5,000 Units  Subcutaneous Q8H Tat, Onalee Hua, MD   5,000 Units at 06/17/23 0544   HYDROmorphone (DILAUDID) injection 0.5 mg  0.5 mg Intravenous Q3H PRN Tat, Onalee Hua, MD       ondansetron [facility]) tablet 4 mg  4 mg Oral Q6H PRN Tat, David, MD       Or   ondansetron [facility]) injection 4 mg  4 mg  Intravenous Q6H PRN Tat, Onalee Hua, MD       pantoprazole (PROTONIX) EC tablet 40 mg  40 mg Oral Daily Tat, David, MD   40 mg at 06/16/23 1011   piperacillin-tazobactam (ZOSYN) IVPB 3.375 g  3.375 g Intravenous Andres Labrum, MD 12.5 mL/hr at 06/17/23 0543 3.375 g at 06/17/23 0543   sodium chloride (OCEAN) 0.65 % nasal spray 1 spray  1 spray Each Nare PRN Catarina Hartshorn, MD   1 spray at 06/17/23 0356    Allergies as of 06/15/2023 - Review Complete 06/15/2023  Allergen Reaction Noted   Codeine Nausea And Vomiting 09/12/2011   Diclofenac Other (See Comments) 01/08/2018   Lisinopril Other (See Comments) 09/10/2016    Social History   Socioeconomic History   Marital status: Married    Spouse name: Not on file   Number of children: Not on file   Years of education: Not on file   Highest education level: Not on file  Occupational History   Occupation: employed    Employer: [employer]    Comment: [employer]  Tobacco Use   Smoking status: Never   Smokeless tobacco: Never  Vaping Use   Vaping status: Never Used  Substance and Sexual Activity   Alcohol use: No   Drug use: No   Sexual activity: Yes    Birth control/protection: Surgical  Other Topics Concern   Not on file  Social History Narrative   Not on file   Social Drivers of Health   Financial Resource Strain: Not on file  Food Insecurity: No Food Insecurity (06/16/2023)   Hunger Vital Sign    Worried About Running Out of Food in the Last Year: Never true    Ran Out of Food in the Last Year: Never true  Transportation Needs: No Transportation Needs (06/16/2023)   PRAPARE - Administrator, Civil Service (Medical): No    Lack of  Transportation (Non-Medical): No  Physical Activity: Not on file  Stress: Not on file  Social Connections: Not on file  Intimate Partner Violence: Not At Risk (06/16/2023)   Humiliation, Afraid, Rape, and Kick questionnaire    Fear of Current or Ex-Partner: No    Emotionally Abused: No    Physically Abused: No    Sexually Abused: No    Code Status   Code Status: Full Code  Review of Systems: All systems reviewed and negative except where noted in HPI.  Physical Exam: Vital signs in last 24 hours: Temp:  [98.1 F (36.7 C)-100 F (37.8 C)] 98.4 F (36.9 C) (12/30 0723) Pulse Rate:  [70-81] 70 (12/30 0723) Resp:  [16-18] 16 (12/30 0723) BP: (104-136)/(55-61) 126/56 (12/30 0723) SpO2:  [94 %-98 %] 96 % (12/30 0723) Last BM Date : 06/15/23  General:  Pleasant female in NAD Psych:  Cooperative. Normal mood and affect Eyes: Pupils equal Ears:  Normal auditory acuity Nose: No deformity, discharge or lesions Neck:  Supple, no masses felt Lungs:  Clear to auscultation.  Heart:  Regular rate, regular rhythm.  Abdomen:  Soft, nondistended, nontender, active bowel sounds, no masses felt Rectal :  Deferred Msk: Symmetrical without gross deformities.  Neurologic:  Alert, oriented, grossly normal neurologically Extremities : No edema Skin:  Intact without significant lesions.    Intake/Output from previous day: 12/29 0701 - 12/30 0700 In: 579.9 [P.O.:480; IV Piggyback:99.9] Out: -  Intake/Output this shift:  No intake/output data recorded.   Willette Cluster, NP-C   06/17/2023, 10:09  AM

## 2023-06-17 NOTE — Assessment & Plan Note (Addendum)
06-17-2023 due to choledocholithasis. 06-18-2023 resolved.

## 2023-06-17 NOTE — Op Note (Signed)
[facility] Patient Name: Traci Chandler Procedure Date : 06/17/2023 MRN: [id number] Attending MD: Lynann Bologna , MD, [id number] Date of Birth: 1956-02-22 CSN: [id number] Age: 67 Admit Type: Inpatient Procedure:                ERCP Indications:              Bile duct stone(s), Jaundice Providers:                Lynann Bologna, MD, Eliberto Ivory, RN, Alan Ripper,                            Technician Referring MD:             Dr Ileene Patrick. Medicines:                General Anesthesia, patient already on Zosyn,                            glucagon 0.75 mg, Indomethacin 100 mg PR Complications:            No immediate complications. Estimated Blood Loss:     Estimated blood loss: none. Procedure:                Pre-Anesthesia Assessment:                           - Prior to the procedure, a History and Physical                            was performed, and patient medications and                            allergies were reviewed. The patient's tolerance of                            previous anesthesia was also reviewed. The risks                            and benefits of the procedure and the sedation                            options and risks were discussed with the patient.                            All questions were answered, and informed consent                            was obtained. Prior Anticoagulants: The patient has                            taken no anticoagulant or antiplatelet agents. ASA                            Grade Assessment: II - A patient with mild systemic  disease. After reviewing the risks and benefits,                            the patient was deemed in satisfactory condition to                            undergo the procedure.                           After obtaining informed consent, the scope was                            passed under direct vision. Throughout the                            procedure, the  patient's blood pressure, pulse, and                            oxygen saturations were monitored continuously. The                            TJF-Q190V ([id number] Olympus duodenoscope was                            introduced through the mouth, and used to inject                            contrast into and used to inject contrast into the                            bile duct. The ERCP was accomplished without                            difficulty. The patient tolerated the procedure                            well. Scope In: Scope Out: Findings:      A scout film of the abdomen was obtained. Surgical clips, consistent       with a previous cholecystectomy, were seen in the area of the right       upper quadrant of the abdomen. The esophagus was successfully intubated       under direct vision. The scope was advanced to major papilla in the       descending duodenum without detailed examination of the pharynx, larynx       and associated structures, and upper GI tract. The upper GI tract was       grossly normal.      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 Major papilla was downward       facing under the hood with opening at the lateral wall of diverticula.       This did distort anatomy. The bile duct was deeply cannulated with the       short-nosed traction sphincterotome using wire-guided approach on first       attempt. Contrast was injected. I personally interpreted the bile duct  images. Ductal flow of contrast was adequate. Image quality was       adequate. Contrast extended to the entire biliary tree except GB       (previous cholecystectomy).      Large dilated "sigmoid" appearing common bile duct measuring       approximately 15 mm with multiple filling defects consistent with       choledocholithiasis. An 8 mm biliary sphincterotomy was made with a       monofilament traction (standard) sphincterotome using ERBE       electrocautery (endocut mode). There was no  post-sphincterotomy       bleeding. Multiple small stones spontaneously came out along with       sludge. There was no pus. Dilation of the common bile duct with a       03-29-11 mm x 5.5 cm CRE balloon (to a maximum balloon size of 12 mm x 1       minute each) dilator was successful. The biliary tree was swept with a       12-15 mm balloon starting at the bifurcation several times with       resultant extraction of 3 large stones, several small stones and sludge.       Bile was green. Due to size of the CBD, we were not sure of small stones       still remained. Also due to previously elevated WBC count and suspicion       of cholangitis on admission, it was decided to place Endo biliary stent.       One 10 Fr by 7 cm plastic stent was placed into the common bile duct.       The stent was in good position with good flow. Postprocedure       photodocumentation and x-ray documentation was obtained.      Pancreatic duct was intentionally not cannulated or injected.       Indomethacin suppositories were given prior to insertion of the scope.       It was determined that she did not have true allergy to nonsteroidals       rather than intolerance prior to the procedure. Aggressive IV hydration       protocol was utilized. Impression:               - Choledocholithiasis s/p biliary                            sphincterotomy/sphincteroplasty and balloon                            extraction. Followed by Endo biliary stenting                           - Previous cholecystectomy.                           - Periampullary diverticula Recommendation:           - Return patient to hospital ward for ongoing care.                           - Clear liquid diet. Advance to low-fat diet in AM.                           -  Repeat ERCP in 6-8 weeks to remove stent, repeat                            cholangiogram/balloon sweep if needed.                           - Watch for pancreatitis, bleeding,  perforation,                            and cholangitis.                           - The findings and recommendations were discussed                            with the patient. Procedure Code(s):        --- Professional ---                           [phone], Endoscopic retrograde                            cholangiopancreatography (ERCP); with placement of                            endoscopic stent into biliary or pancreatic duct,                            including pre- and post-dilation and guide wire                            passage, when performed, including sphincterotomy,                            when performed, each stent                           [id number], Endoscopic retrograde                            cholangiopancreatography (ERCP); with removal of                            calculi/debris from biliary/pancreatic duct(s)                           47829, Endoscopic catheterization of the biliary                            ductal system, radiological supervision and                            interpretation Diagnosis Code(s):        --- Professional ---                           K80.50, Calculus of bile duct without cholangitis  or cholecystitis without obstruction                           R17, Unspecified jaundice CPT copyright 2022 [employer]. All rights reserved. The codes documented in this report are preliminary and upon coder review may  be revised to meet current compliance requirements. Lynann Bologna, MD 06/17/2023 2:44:02 PM This report has been signed electronically. Number of Addenda: 0

## 2023-06-17 NOTE — Plan of Care (Signed)
  Problem: Activity: Goal: Risk for activity intolerance will decrease Outcome: Progressing   Problem: Pain Management: Goal: General experience of comfort will improve Outcome: Progressing   Problem: Safety: Goal: Ability to remain free from injury will improve Outcome: Progressing   Problem: Skin Integrity: Goal: Risk for impaired skin integrity will decrease Outcome: Progressing

## 2023-06-17 NOTE — Assessment & Plan Note (Addendum)
06-17-2023 on atenolol 50 mg daily. Awaiting her diet to be upgraded and adequate oral intake before starting the rest of her HTN meds.  06-18-2023 pt to restart diet today. May need to hold her other BP meds at discharge depending on her oral liquid and food intake.  06-18-2023 ok to restart home BP meds. Discharge BP is 149/69

## 2023-06-17 NOTE — Subjective & Objective (Addendum)
Pt seen and examined. Pt had successful ERCP yesterday with extraction of multiple biliary stones and stent placement. Lipase normal this AM. Changed to low fat diet today.

## 2023-06-17 NOTE — Anesthesia Preprocedure Evaluation (Signed)
Anesthesia Evaluation  Patient identified by MRN, date of birth, ID band Patient awake    Reviewed: Allergy & Precautions, NPO status , Patient's Chart, lab work & pertinent test results, reviewed documented beta blocker date and time   History of Anesthesia Complications Negative for: history of anesthetic complications  Airway Mallampati: III  TM Distance: >3 FB Neck ROM: Full    Dental no notable dental hx.    Pulmonary neg COPD   breath sounds clear to auscultation       Cardiovascular hypertension, (-) angina (-) CAD, (-) Orthopnea, (-) PND and (-) DOE + dysrhythmias  Rhythm:Regular Rate:Normal  HOCM however no significant gradient with exercise stress   Neuro/Psych neg Seizures    GI/Hepatic ,GERD  ,,(+) neg Cirrhosis        Endo/Other    Renal/GU CRFRenal disease     Musculoskeletal   Abdominal   Peds  Hematology   Anesthesia Other Findings   Reproductive/Obstetrics                              Anesthesia Physical Anesthesia Plan  ASA: 3  Anesthesia Plan: General   Post-op Pain Management:    Induction:   PONV Risk Score and Plan: 2 and Ondansetron and Dexamethasone  Airway Management Planned: Oral ETT and Video Laryngoscope Planned  Additional Equipment:   Intra-op Plan:   Post-operative Plan: Extubation in OR  Informed Consent: I have reviewed the patients History and Physical, chart, labs and discussed the procedure including the risks, benefits and alternatives for the proposed anesthesia with the patient or authorized representative who has indicated his/her understanding and acceptance.       Plan Discussed with: CRNA  Anesthesia Plan Comments:          Anesthesia Quick Evaluation

## 2023-06-18 ENCOUNTER — Telehealth: Payer: Self-pay

## 2023-06-18 DIAGNOSIS — K8050 Calculus of bile duct without cholangitis or cholecystitis without obstruction: Secondary | ICD-10-CM | POA: Diagnosis not present

## 2023-06-18 DIAGNOSIS — N1832 Chronic kidney disease, stage 3b: Secondary | ICD-10-CM | POA: Diagnosis not present

## 2023-06-18 DIAGNOSIS — R112 Nausea with vomiting, unspecified: Secondary | ICD-10-CM | POA: Diagnosis not present

## 2023-06-18 DIAGNOSIS — R7989 Other specified abnormal findings of blood chemistry: Secondary | ICD-10-CM | POA: Diagnosis not present

## 2023-06-18 LAB — CBC WITH DIFFERENTIAL/PLATELET
Abs Immature Granulocytes: 0.03 10*3/uL (ref 0.00–0.07)
Basophils Absolute: 0 10*3/uL (ref 0.0–0.1)
Basophils Relative: 0 %
Eosinophils Absolute: 0 10*3/uL (ref 0.0–0.5)
Eosinophils Relative: 0 %
HCT: 40.1 % (ref 36.0–46.0)
Hemoglobin: 13.4 g/dL (ref 12.0–15.0)
Immature Granulocytes: 0 %
Lymphocytes Relative: 9 %
Lymphs Abs: 0.7 10*3/uL (ref 0.7–4.0)
MCH: 29.8 pg (ref 26.0–34.0)
MCHC: 33.4 g/dL (ref 30.0–36.0)
MCV: 89.3 fL (ref 80.0–100.0)
Monocytes Absolute: 0.3 10*3/uL (ref 0.1–1.0)
Monocytes Relative: 3 %
Neutro Abs: 6.8 10*3/uL (ref 1.7–7.7)
Neutrophils Relative %: 88 %
Platelets: 139 10*3/uL — ABNORMAL LOW (ref 150–400)
RBC: 4.49 MIL/uL (ref 3.87–5.11)
RDW: 12.4 % (ref 11.5–15.5)
WBC: 7.8 10*3/uL (ref 4.0–10.5)
nRBC: 0 % (ref 0.0–0.2)

## 2023-06-18 LAB — COMPREHENSIVE METABOLIC PANEL
ALT: 143 U/L — ABNORMAL HIGH (ref 0–44)
AST: 52 U/L — ABNORMAL HIGH (ref 15–41)
Albumin: 3.2 g/dL — ABNORMAL LOW (ref 3.5–5.0)
Alkaline Phosphatase: 98 U/L (ref 38–126)
Anion gap: 10 (ref 5–15)
BUN: 20 mg/dL (ref 8–23)
CO2: 24 mmol/L (ref 22–32)
Calcium: 9.4 mg/dL (ref 8.9–10.3)
Chloride: 104 mmol/L (ref 98–111)
Creatinine, Ser: 1.41 mg/dL — ABNORMAL HIGH (ref 0.44–1.00)
GFR, Estimated: 40 mL/min — ABNORMAL LOW (ref >60)
Glucose, Bld: 123 mg/dL — ABNORMAL HIGH (ref 70–99)
Potassium: 4.3 mmol/L (ref 3.5–5.1)
Sodium: 138 mmol/L (ref 135–145)
Total Bilirubin: 2 mg/dL — ABNORMAL HIGH (ref 0.0–1.2)
Total Protein: 7.1 g/dL (ref 6.5–8.1)

## 2023-06-18 LAB — LIPASE, BLOOD: Lipase: 25 U/L (ref 11–51)

## 2023-06-18 MED ORDER — ONDANSETRON HCL 4 MG PO TABS: 4.0000 mg | ORAL_TABLET | Freq: Four times a day (QID) | ORAL | 0 refills | Status: AC | PRN

## 2023-06-18 MED ORDER — AMOXICILLIN-POT CLAVULANATE 875-125 MG PO TABS: 1.0000 | ORAL_TABLET | Freq: Two times a day (BID) | ORAL | 0 refills | Status: AC

## 2023-06-18 MED ORDER — OXYCODONE HCL 5 MG PO TABS: 5.0000 mg | ORAL_TABLET | Freq: Four times a day (QID) | ORAL | 0 refills | Status: AC | PRN

## 2023-06-18 NOTE — Progress Notes (Signed)
AVS given and reviewed with pt. Medications discussed. All questions answered to satisfaction. Pt verbalized understanding of information given. Pt escorted off the unit with all belongings via wheelchair by staff member.  

## 2023-06-18 NOTE — Discharge Summary (Signed)
 [facility] Physician Discharge Summary   Patient name: Traci Chandler  Admit date:     06/15/2023  Discharge date: 06/18/2023  Attending Physician: ADEFESO, OLADAPO [[id number]]  Discharge Physician: Camellia Door   PCP: Gerome Brunet, DO  Admitted From: Home  Disposition:  Home  Recommendations for Outpatient Follow-up:  Follow up with PCP in 1-2 weeks Follow up with GI as scheduled Please follow up on the following pending results: will need repeat LFTs at PCP f/u visit  Home Health:No Equipment/Devices: None  Discharge Condition:Stable CODE STATUS:FULL Diet recommendation: Heart Healthy Fluid Restriction: None  Hospital Summary: HPI: 67 year old female with a history of hypertension, hypertrophic cardiomyopathy, hyperlipidemia presenting with upper abdominal pain with associated nausea and vomiting in the early morning 06/16/2023.  The patient had a similar episode on 06/10/2023 with nausea and vomiting.  She stated that it improved spontaneously and did not have a recurrence until 06/16/2023.  She denies any new medications.  She denies any alcohol or illicit drug use.  She has had subjective fevers and chills at home.  She denies any headache, chest pain, shortness of breath, coughing. In the ED, the patient had low-grade temperature 100.4.  She was tachycardic in the 110.  She is hemodynamically stable with oxygen saturation 97% room air.   GI, Dr. Stacia, was consulted and requested transfer to [facility] for ERCP.     Significant Events: Admitted 06/15/2023 for choledocholithiasis   Significant Labs: WBC 6.6, hemoglobin 14.2, plates 841.  Sodium 136, potassium 3.8, bicarbonate 26, serum creatinine 1.32.  Significant Imaging Studies: CT abdomen and pelvis showed intra and extrahepatic biliary ductal dilatation with 11 mm round density in the distal CBD.   MRCP was then obtained and showed choledocholithiasis with dilated ducts.  Antibiotic  Therapy: Anti-infectives (From admission, onward)    Start     Dose/Rate Route Frequency Ordered Stop   06/16/23 1400  [MAR Hold]  piperacillin -tazobactam (ZOSYN ) IVPB 3.375 g        (MAR Hold since Mon 06/17/2023 at 1235.Hold Reason: Transfer to a Procedural area)   3.375 g 12.5 mL/hr over 240 Minutes Intravenous Every 8 hours 06/16/23 1052         Procedures: 06-17-2023 ERCP with biliary stent placement  Consultants: GI   Hospital Course by Problem: * Choledocholithiasis 06-17-2023 pt is s/p cholecystectomy in 02-2021. MRCP shows stone in CBD. Going for ERCP today. 06-18-2023 had successful ERCP yesterday with multiple biliary stones removed and biliary stent placement. Pt will need f/u with GI for repeat EGD and biliary stent removal.  06-18-2023 GI requested that pt be sent home with short course of po abx. DC to home with 5 days of augmentin  875 mgm bid.  Nausea & vomiting 06-17-2023 due to choledocholithasis. 06-18-2023 resolved.  Elevated liver function tests 06-18-2023 likely due to choledocholithiasis. She will need repeat LFTs when she f/u with GI in office.  Chronic kidney disease, stage 3b (HCC) - baseline Scr 1.2-1.5 06-18-2023 Scr 1.41. baseline Scr 1.2-1.5  Essential hypertension, benign 06-17-2023 on atenolol  50 mg daily. Awaiting her diet to be upgraded and adequate oral intake before starting the rest of her HTN meds.  06-18-2023 pt to restart diet today. May need to hold her other BP meds at discharge depending on her oral liquid and food intake.  06-18-2023 ok to restart home BP meds. Discharge BP is 149/69  Hypertrophic cardiomyopathy (HCC) 06-17-2023 stable. Remains on atenolol  06-18-2023 stable.  Obesity, Class II, BMI 35-39.9 BMI 37.95  Discharge Diagnoses:  Principal Problem:   Choledocholithiasis Active Problems:   Nausea & vomiting   Hypertrophic cardiomyopathy (HCC)   Essential hypertension, benign   GERD (gastroesophageal reflux  disease)   Mixed hyperlipidemia   Chronic kidney disease, stage 3b (HCC) - baseline Scr 1.2-1.5   Elevated liver function tests   Obesity, Class II, BMI 35-39.9   Discharge Instructions  Discharge Instructions     Call MD for:  difficulty breathing, headache or visual disturbances   Complete by: As directed    Call MD for:  extreme fatigue   Complete by: As directed    Call MD for:  persistant dizziness or light-headedness   Complete by: As directed    Call MD for:  persistant nausea and vomiting   Complete by: As directed    Call MD for:  severe uncontrolled pain   Complete by: As directed    Call MD for:  temperature >100.4   Complete by: As directed    Diet - low sodium heart healthy   Complete by: As directed    Discharge instructions   Complete by: As directed    1. Follow up with your primary care provider in 1-2 weeks 2. GI specialist office will call you with a follow up appointment to have your biliary stent removed. 3. Stay on a low fat diet. Drink plenty of clear, sugar-free liquids.   Increase activity slowly   Complete by: As directed       Allergies as of 06/18/2023       Reactions   Codeine Nausea And Vomiting   Diclofenac Other (See Comments)   Lisinopril  Other (See Comments)        Medication List     STOP taking these medications    polyethylene glycol 17 g packet Commonly known as: MIRALAX / GLYCOLAX   predniSONE  50 MG tablet Commonly known as: DELTASONE    traMADol 50 MG tablet Commonly known as: ULTRAM   triamcinolone cream 0.1 % Commonly known as: KENALOG       TAKE these medications    amoxicillin -clavulanate 875-125 MG tablet Commonly known as: AUGMENTIN  Take 1 tablet by mouth 2 (two) times daily for 5 days.   aspirin  81 MG tablet Take 81 mg by mouth every evening.   atenolol  50 MG tablet Commonly known as: TENORMIN  Take 1 tablet by mouth daily.   NIFEdipine  60 MG 24 hr tablet Commonly known as: PROCARDIA   XL/NIFEDICAL XL Take 60 mg by mouth daily.   omeprazole  20 MG capsule Commonly known as: PRILOSEC TAKE 1 CAPSULE 2 TIMES A DAY BEFORE A MEAL. What changed: See the new instructions.   ondansetron  4 MG tablet Commonly known as: ZOFRAN  Take 1 tablet (4 mg total) by mouth every 6 (six) hours as needed for nausea or vomiting.   oxyCODONE  5 MG immediate release tablet Commonly known as: Roxicodone  Take 1 tablet (5 mg total) by mouth every 6 (six) hours as needed for up to 7 days for severe pain (pain score 7-10) or moderate pain (pain score 4-6).   rosuvastatin  5 MG tablet Commonly known as: CRESTOR  Take 1 tablet (5 mg total) by mouth daily.   spironolactone  25 MG tablet Commonly known as: ALDACTONE  Take 25 mg by mouth 2 (two) times daily.        Allergies  Allergen Reactions   Codeine Nausea And Vomiting   Diclofenac Other (See Comments)   Lisinopril  Other (See Comments)    Discharge Exam: Vitals:  06/18/23 0428 06/18/23 0755  BP: (!) 153/62 (!) 149/69  Pulse: (!) 59 64  Resp: 16 18  Temp: 98 F (36.7 C) 98.4 F (36.9 C)  SpO2: 95% 96%    Physical Exam Vitals and nursing note reviewed.  Constitutional:      General: She is not in acute distress.    Appearance: Normal appearance. She is obese. She is not toxic-appearing or diaphoretic.  HENT:     Head: Normocephalic and atraumatic.     Nose: Nose normal.  Eyes:     General: No scleral icterus. Cardiovascular:     Rate and Rhythm: Normal rate and regular rhythm.  Pulmonary:     Effort: Pulmonary effort is normal.     Breath sounds: Normal breath sounds.  Abdominal:     General: Bowel sounds are normal.     Palpations: Abdomen is soft.     Tenderness: There is no guarding or rebound.     Comments: Mild epigastric tenderness  Musculoskeletal:     Right lower leg: No edema.     Left lower leg: No edema.  Skin:    General: Skin is warm and dry.     Capillary Refill: Capillary refill takes less than 2  seconds.  Neurological:     General: No focal deficit present.     Mental Status: She is alert and oriented to person, place, and time.     The results of significant diagnostics from this hospitalization (including imaging, microbiology, ancillary and laboratory) are listed below for reference.     Labs: Basic Metabolic Panel: Recent Labs  Lab 06/15/23 2248 06/16/23 1032 06/17/23 0606 06/18/23 0331  NA 136 136 137 138  K 3.8 4.5 3.8 4.3  CL 103 102 103 104  CO2 26 23 24 24   GLUCOSE 151* 122* 108* 123*  BUN 20 25* 20 20  CREATININE 1.32* 1.63* 1.68* 1.41*  CALCIUM  9.4 9.4 9.0 9.4  MG  --   --  1.9  --   PHOS  --   --  2.2*  --    Liver Function Tests: Recent Labs  Lab 06/15/23 2248 06/16/23 1032 06/17/23 0606 06/18/23 0331  AST 236* 238* 109* 52*  ALT 184* 256* 182* 143*  ALKPHOS 125 109 97 98  BILITOT 1.4* 3.0* 5.3* 2.0*  PROT 7.9 7.3 6.6 7.1  ALBUMIN 4.1 3.6 3.1* 3.2*   Recent Labs  Lab 06/15/23 2248 06/18/23 0331  LIPASE 44 25   CBC: Recent Labs  Lab 06/15/23 2248 06/16/23 1032 06/17/23 0606 06/18/23 0331  WBC 6.6 19.1* 9.2 7.8  NEUTROABS 5.3  --   --  6.8  HGB 14.2 13.4 12.3 13.4  HCT 43.9 40.0 36.8 40.1  MCV 92.6 90.9 89.8 89.3  PLT 158 147* 120* 139*   Urinalysis    Component Value Date/Time   COLORURINE AMBER (A) 03/06/2021 0351   APPEARANCEUR CLOUDY (A) 03/06/2021 0351   LABSPEC 1.016 03/06/2021 0351   PHURINE 5.0 03/06/2021 0351   GLUCOSEU NEGATIVE 03/06/2021 0351   HGBUR SMALL (A) 03/06/2021 0351   BILIRUBINUR NEGATIVE 03/06/2021 0351   KETONESUR NEGATIVE 03/06/2021 0351   PROTEINUR NEGATIVE 03/06/2021 0351   UROBILINOGEN 0.2 10/13/2012 2021   NITRITE NEGATIVE 03/06/2021 0351   LEUKOCYTESUR LARGE (A) 03/06/2021 0351   Sepsis Labs Recent Labs  Lab 06/15/23 2248 06/16/23 1032 06/17/23 0606 06/18/23 0331  WBC 6.6 19.1* 9.2 7.8   Microbiology No results found for this or any previous visit (from the  past 240  hours).  Procedures/Studies: DG ERCP Result Date: 06/17/2023 CLINICAL DATA:  67 year old female with biliary tract obstruction EXAM: ERCP TECHNIQUE: Multiple spot images obtained with the fluoroscopic device and submitted for interpretation post-procedure. FLUOROSCOPY: Radiation Exposure Index (as provided by the fluoroscopic device): 35 mGy Kerma COMPARISON:  MR 06/16/2023 FINDINGS: Limited intraoperative fluoroscopic spot images during ERCP. Initial image demonstrates the endoscope projecting over the upper abdomen with surgical changes of cholecystectomy. There is subsequent placement of a safety wire into the extrahepatic biliary ducts and retrograde infusion of contrast. Geometric filling defects in the bile duct. Extrahepatic ductal dilatation. Passage of a retrieval balloon/balloon catheter. Placement of a plastic biliary stent. IMPRESSION: Limited images during ERCP demonstrates treatment of choledocholithiasis with balloon/PTA and placement of plastic biliary stent. Please refer to the dictated operative report for full details of intraoperative findings and procedure. Electronically Signed   By: Ami Bellman D.O.   On: 06/17/2023 16:03   MR ABDOMEN MRCP WO CONTRAST Result Date: 06/16/2023 CLINICAL DATA:  67 year old female with clinical concern for biliary tract obstruction. EXAM: MRI ABDOMEN WITHOUT CONTRAST  (INCLUDING MRCP) TECHNIQUE: Multiplanar multisequence MR imaging of the abdomen was performed. Heavily T2-weighted images of the biliary and pancreatic ducts were obtained, and three-dimensional MRCP images were rendered by post processing. COMPARISON:  Abdominal MRI 03/02/2021. CT of the abdomen and pelvis 06/15/2023. FINDINGS: Comment: Today's study is limited for detection and characterization of visceral and/or vascular lesions by lack of IV gadolinium. Lower chest: Small hiatal hernia. Hepatobiliary: Diffuse loss of signal intensity throughout the hepatic parenchyma on out of phase  dual echo images, indicative of a background of hepatic steatosis. No suspicious cystic or solid hepatic lesions. Status post cholecystectomy. MRCP images demonstrate moderate intrahepatic biliary ductal dilatation. Severe dilatation of the common bile duct which measures up to 13 mm in diameter proximally. Multiple filling defects within the distal common bile duct, largest of which measures up to 1.3 cm in diameter. Pancreas: No definite pancreatic mass confidently identified on today's noncontrast examination. No pancreatic ductal dilatation. No pancreatic or peripancreatic fluid collections or inflammatory changes. Spleen:  Unremarkable. Adrenals/Urinary Tract: Multiple T1 hypointense, T2 hyperintense lesions in both kidneys, incompletely characterized on today's noncontrast CT examination, but statistically likely cysts (no imaging follow-up recommended) measuring up to 4.4 cm in the interpolar region of the left kidney. No hydroureteronephrosis in the visualized portions of the abdomen. Bilateral adrenal glands are normal in appearance. Stomach/Bowel: Colonic diverticulosis. Vascular/Lymphatic: No definite aneurysm identified in the visualized abdominal vasculature. No lymphadenopathy noted in the abdomen. Other: No significant volume of ascites noted in the visualized portions of the peritoneal cavity. Musculoskeletal: No aggressive appearing osseous lesions are noted in the visualized portions of the skeleton. IMPRESSION: 1. Study is positive for choledocholithiasis with intra and extrahepatic biliary ductal dilatation indicating biliary obstruction. 2. Hepatic steatosis. 3. Colonic diverticulosis. 4. Additional incidental findings, as above. Electronically Signed   By: Toribio Aye M.D.   On: 06/16/2023 08:54   CT ABDOMEN PELVIS W CONTRAST Result Date: 06/16/2023 CLINICAL DATA:  LUQ abdominal pain c/o chest pain that began 2 hrs PTA. Pt reports pain is generalized and radiates to her back EXAM: CT  ABDOMEN AND PELVIS WITH CONTRAST TECHNIQUE: Multidetector CT imaging of the abdomen and pelvis was performed using the standard protocol following bolus administration of intravenous contrast. RADIATION DOSE REDUCTION: This exam was performed according to the departmental dose-optimization program which includes automated exposure control, adjustment of the mA and/or kV according to  patient size and/or use of iterative reconstruction technique. CONTRAST:  80mL OMNIPAQUE  IOHEXOL  300 MG/ML  SOLN COMPARISON:  CT abdomen pelvis 03/02/2021 FINDINGS: Lower chest: Small hiatal hernia.  No acute abnormality. Hepatobiliary: The liver is enlarged measuring up to 22 cm. No focal liver abnormality. Status post cholecystectomy. Intra and extrahepatic biliary dilatation. Rounded density measuring 11 mm at the distal common bile duct. Pancreas: No focal lesion. Normal pancreatic contour. No surrounding inflammatory changes. Stable proximal pancreatic duct focal dilatation up to 6 mm. Spleen: Normal in size without focal abnormality. Adrenals/Urinary Tract: No adrenal nodule bilaterally. Bilateral kidneys enhance symmetrically. Fluid dense lesions of the kidneys likely represent simple renal cysts. Simple renal cysts, in the absence of clinically indicated signs/symptoms, require no independent follow-up. No hydronephrosis. No hydroureter. The urinary bladder is unremarkable. On delayed imaging, there is no urothelial wall thickening and there are no filling defects in the opacified portions of the bilateral collecting systems or ureters. Stomach/Bowel: Stomach is within normal limits. No evidence of bowel wall thickening or dilatation. Colonic diverticulosis. Appendix appears normal. Vascular/Lymphatic: No abdominal aorta or iliac aneurysm. Severe atherosclerotic plaque of the aorta and its branches. No abdominal, pelvic, or inguinal lymphadenopathy. Reproductive: Uterus and bilateral adnexa are unremarkable. Bilateral Bartholin  cysts again noted (2:87) Other: No intraperitoneal free fluid. No intraperitoneal free gas. No organized fluid collection. Musculoskeletal: No abdominal wall hernia or abnormality.  Diastasis rectus. No suspicious lytic or blastic osseous lesions. No acute displaced fracture. IMPRESSION: 1. Interval development of intra and extrahepatic biliary ductal dilatation with a 11 mm rounded density in the distal common bile duct. Status post cholecystectomy. Findings suggestive of obstructive choledocholithiasis with underlying soft tissue mass not fully excluded. 2. Hepatomegaly. 3. Small hiatal hernia. 4. Colonic diverticulosis with no acute diverticulitis. 5. Bilateral Bartholin cysts. 6.  Aortic Atherosclerosis (ICD10-I70.0). Electronically Signed   By: Morgane  Naveau M.D.   On: 06/16/2023 00:03   DG Chest 2 View Result Date: 06/15/2023 CLINICAL DATA:  Chest pain. EXAM: CHEST - 2 VIEW COMPARISON:  01/08/2018 FINDINGS: Stable upper normal heart size.The cardiomediastinal contours are normal. The lungs are clear. Pulmonary vasculature is normal. No consolidation, pleural effusion, or pneumothorax. No acute osseous abnormalities are seen. IMPRESSION: No acute chest findings. Electronically Signed   By: Andrea Gasman M.D.   On: 06/15/2023 22:36    Time coordinating discharge: 50 mins  SIGNED:  Camellia Door, DO [facility] 06/18/23, 1:59 PM

## 2023-06-18 NOTE — Anesthesia Postprocedure Evaluation (Signed)
Anesthesia Post Note  Patient: Traci Chandler  Procedure(s) Performed: ENDOSCOPIC RETROGRADE CHOLANGIOPANCREATOGRAPHY (ERCP) SPHINCTEROTOMY SPHINCTEROPLASTY REMOVAL OF STONES BILIARY STENT PLACEMENT     Patient location during evaluation: PACU Anesthesia Type: General Level of consciousness: awake Pain management: pain level controlled Vital Signs Assessment: post-procedure vital signs reviewed and stable Respiratory status: spontaneous breathing, nonlabored ventilation and respiratory function stable Cardiovascular status: blood pressure returned to baseline and stable Postop Assessment: no apparent nausea or vomiting Anesthetic complications: no   No notable events documented.    Pain Goal:                   Linton Rump

## 2023-06-18 NOTE — Progress Notes (Signed)
 Progress Note   Subjective  Post ERCP - doing well, eating in the room today. Family at bedside.    Objective   Vital signs in last 24 hours: Temp:  [97.4 F (36.3 C)-98.8 F (37.1 C)] 98.4 F (36.9 C) (12/31 0755) Pulse Rate:  [59-75] 64 (12/31 0755) Resp:  [11-20] 18 (12/31 0755) BP: (128-156)/(59-96) 149/69 (12/31 0755) SpO2:  [95 %-98 %] 96 % (12/31 0755) Last BM Date : 06/17/23 General:    AA female in NAD Neurologic:  Alert and oriented,  grossly normal neurologically. Psych:  Cooperative. Normal mood and affect.  Intake/Output from previous day: 12/30 0701 - 12/31 0700 In: 1000 [P.O.:600; I.V.:300; IV Piggyback:100] Out: -  Intake/Output this shift: No intake/output data recorded.  Lab Results: Recent Labs    06/16/23 1032 06/17/23 0606 06/18/23 0331  WBC 19.1* 9.2 7.8  HGB 13.4 12.3 13.4  HCT 40.0 36.8 40.1  PLT 147* 120* 139*   BMET Recent Labs    06/16/23 1032 06/17/23 0606 06/18/23 0331  NA 136 137 138  K 4.5 3.8 4.3  CL 102 103 104  CO2 23 24 24   GLUCOSE 122* 108* 123*  BUN 25* 20 20  CREATININE 1.63* 1.68* 1.41*  CALCIUM  9.4 9.0 9.4   LFT Recent Labs    06/15/23 2248 06/16/23 1032 06/18/23 0331  PROT 7.9   < > 7.1  ALBUMIN 4.1   < > 3.2*  AST 236*   < > 52*  ALT 184*   < > 143*  ALKPHOS 125   < > 98  BILITOT 1.4*   < > 2.0*  BILIDIR 0.7*  --   --   IBILI 0.7  --   --    < > = values in this interval not displayed.   PT/INR No results for input(s): LABPROT, INR in the last 72 hours.  Studies/Results: DG ERCP Result Date: 06/17/2023 CLINICAL DATA:  67 year old female with biliary tract obstruction EXAM: ERCP TECHNIQUE: Multiple spot images obtained with the fluoroscopic device and submitted for interpretation post-procedure. FLUOROSCOPY: Radiation Exposure Index (as provided by the fluoroscopic device): 35 mGy Kerma COMPARISON:  MR 06/16/2023 FINDINGS: Limited intraoperative fluoroscopic spot images during ERCP.  Initial image demonstrates the endoscope projecting over the upper abdomen with surgical changes of cholecystectomy. There is subsequent placement of a safety wire into the extrahepatic biliary ducts and retrograde infusion of contrast. Geometric filling defects in the bile duct. Extrahepatic ductal dilatation. Passage of a retrieval balloon/balloon catheter. Placement of a plastic biliary stent. IMPRESSION: Limited images during ERCP demonstrates treatment of choledocholithiasis with balloon/PTA and placement of plastic biliary stent. Please refer to the dictated operative report for full details of intraoperative findings and procedure. Electronically Signed   By: Ami Bellman D.O.   On: 06/17/2023 16:03       Assessment / Plan:    67 y/o female here with the following:  Choledocholithiasis (in setting of post cholecystectomy state) S/p ERCP 12/30 - stone extraction with biliary stent  She is doing well post ERCP, LAEs improving. WBC normalized. She is eating okay. I think okay to go home today. Would stay on low fat diet. Dr. Charlanne recommended a repeat ERCP in 6-8 weeks for stent removal and ensure no residual small stones. She has asked us  to continue to see her as outpatient has her last GI physician left the practice she was seeing. I will ask our office to coordinate follow up. Would switch to  oral antibiotics for a few more days upon discharge given suspected cholangitis on admission.  Call with questions, we will sign off for now.  Marcey Naval, MD [facility]

## 2023-06-18 NOTE — Telephone Encounter (Signed)
-----   Message from Nurse Naomie RAMAN sent at 06/18/2023 11:06 AM EST ----- Regarding: FW: outpatient follow up - ERCP  ----- Message ----- From: Leigh Elspeth SQUIBB, MD Sent: 06/18/2023  11:03 AM EST To: Naomie LOISE Sharps, RN; Lynnie Bring, MD Subject: outpatient follow up - ERCP                    Hi Anselm,  Thanks for helping with her ERCP. She has asked to follow up with us  for long term GI care, her prior GI physician left their practice. She needs a repeat ERCP in 6-8 weeks. Is that something you want to do or do post hospital follow up cases go to [facility]? Just let us  know so we can coordinate. [facility] will be her long term GI here at [facility]. Thanks  Dottie can you add Dr. Ira nurse to string, not sure who covers him, for scheduling.   Marcey

## 2023-06-18 NOTE — Progress Notes (Signed)
 PROGRESS NOTE    Traci Chandler  FMW:[id number] DOB: 03-17-1956 DOA: 06/15/2023 PCP: Gerome Brunet, DO  Subjective: Pt seen and examined. Pt had successful ERCP yesterday with extraction of multiple biliary stones and stent placement. Lipase normal this AM. Changed to low fat diet today.   Hospital Course: HPI: 67 year old female with a history of hypertension, hypertrophic cardiomyopathy, hyperlipidemia presenting with upper abdominal pain with associated nausea and vomiting in the early morning 06/16/2023.  The patient had a similar episode on 06/10/2023 with nausea and vomiting.  She stated that it improved spontaneously and did not have a recurrence until 06/16/2023.  She denies any new medications.  She denies any alcohol or illicit drug use.  She has had subjective fevers and chills at home.  She denies any headache, chest pain, shortness of breath, coughing. In the ED, the patient had low-grade temperature 100.4.  She was tachycardic in the 110.  She is hemodynamically stable with oxygen saturation 97% room air.   GI, Dr. Stacia, was consulted and requested transfer to [facility] for ERCP.     Significant Events: Admitted 06/15/2023 for choledocholithiasis   Significant Labs: WBC 6.6, hemoglobin 14.2, plates 841.  Sodium 136, potassium 3.8, bicarbonate 26, serum creatinine 1.32.  Significant Imaging Studies: CT abdomen and pelvis showed intra and extrahepatic biliary ductal dilatation with 11 mm round density in the distal CBD.   MRCP was then obtained and showed choledocholithiasis with dilated ducts.  Antibiotic Therapy: Anti-infectives (From admission, onward)    Start     Dose/Rate Route Frequency Ordered Stop   06/16/23 1400  [MAR Hold]  piperacillin -tazobactam (ZOSYN ) IVPB 3.375 g        (MAR Hold since Mon 06/17/2023 at 1235.Hold Reason: Transfer to a Procedural area)   3.375 g 12.5 mL/hr over 240 Minutes Intravenous Every 8 hours 06/16/23 1052          Procedures: 06-17-2023 ERCP with biliary stent placement  Consultants: GI    Assessment and Plan: * Choledocholithiasis 06-17-2023 pt is s/p cholecystectomy in 02-2021. MRCP shows stone in CBD. Going for ERCP today. 06-18-2023 had successful ERCP yesterday with multiple biliary stones removed and biliary stent placement. Pt will need f/u with GI for repeat EGD and biliary stent removal.  Nausea & vomiting 06-17-2023 due to choledocholithasis. 06-18-2023 resolved.  Elevated liver function tests 06-18-2023 likely due to choledocholithiasis. She will need repeat LFTs when she f/u with GI in office.  Chronic kidney disease, stage 3b (HCC) - baseline Scr 1.2-1.5 06-18-2023 Scr 1.41. baseline Scr 1.2-1.5  Essential hypertension, benign 06-17-2023 on atenolol  50 mg daily. Awaiting her diet to be upgraded and adequate oral intake before starting the rest of her HTN meds.  06-18-2023 pt to restart diet today. May need to hold her other BP meds at discharge depending on her oral liquid and food intake.  Hypertrophic cardiomyopathy (HCC) 06-17-2023 stable. Remains on atenolol  06-18-2023 stable.  Obesity, Class II, BMI 35-39.9 BMI 37.95   DVT prophylaxis: SCDs Start: 06/16/23 9350     Code Status: Full Code Family Communication: no family at bedside. Pt is decisional Disposition Plan: return home Reason for continuing need for hospitalization: awaiting to see if pt tolerates diet today. If she can, then pt will be stable for DC.  Objective: Vitals:   06/17/23 1555 06/17/23 1924 06/18/23 0428 06/18/23 0755  BP: (!) 141/80 (!) 148/65 (!) 153/62 (!) 149/69  Pulse: 61 68 (!) 59 64  Resp: 16 17 16 18   Temp:  98.8 F (37.1 C) 98.5 F (36.9 C) 98 F (36.7 C) 98.4 F (36.9 C)  TempSrc: Oral Oral Oral Oral  SpO2: 96% 97% 95% 96%  Weight:      Height:        Intake/Output Summary (Last 24 hours) at 06/18/2023 1112 Last data filed at 06/18/2023 0930 Gross per 24 hour   Intake 1240 ml  Output --  Net 1240 ml   Filed Weights   06/15/23 2158 06/16/23 0254  Weight: 113 kg 109.9 kg    Examination:  Physical Exam Vitals and nursing note reviewed.  Constitutional:      General: She is not in acute distress.    Appearance: Normal appearance. She is obese. She is not toxic-appearing or diaphoretic.  HENT:     Head: Normocephalic and atraumatic.     Nose: Nose normal.  Eyes:     General: No scleral icterus. Cardiovascular:     Rate and Rhythm: Normal rate and regular rhythm.  Pulmonary:     Effort: Pulmonary effort is normal.     Breath sounds: Normal breath sounds.  Abdominal:     General: Bowel sounds are normal.     Palpations: Abdomen is soft.     Tenderness: There is no guarding or rebound.     Comments: Mild epigastric tenderness  Musculoskeletal:     Right lower leg: No edema.     Left lower leg: No edema.  Skin:    General: Skin is warm and dry.     Capillary Refill: Capillary refill takes less than 2 seconds.  Neurological:     General: No focal deficit present.     Mental Status: She is alert and oriented to person, place, and time.     Data Reviewed: I have personally reviewed following labs and imaging studies  CBC: Recent Labs  Lab 06/15/23 2248 06/16/23 1032 06/17/23 0606 06/18/23 0331  WBC 6.6 19.1* 9.2 7.8  NEUTROABS 5.3  --   --  6.8  HGB 14.2 13.4 12.3 13.4  HCT 43.9 40.0 36.8 40.1  MCV 92.6 90.9 89.8 89.3  PLT 158 147* 120* 139*   Basic Metabolic Panel: Recent Labs  Lab 06/15/23 2248 06/16/23 1032 06/17/23 0606 06/18/23 0331  NA 136 136 137 138  K 3.8 4.5 3.8 4.3  CL 103 102 103 104  CO2 26 23 24 24   GLUCOSE 151* 122* 108* 123*  BUN 20 25* 20 20  CREATININE 1.32* 1.63* 1.68* 1.41*  CALCIUM  9.4 9.4 9.0 9.4  MG  --   --  1.9  --   PHOS  --   --  2.2*  --    GFR: Estimated Creatinine Clearance: 48.1 mL/min (A) (by C-G formula based on SCr of 1.41 mg/dL (H)). Liver Function Tests: Recent Labs   Lab 06/15/23 2248 06/16/23 1032 06/17/23 0606 06/18/23 0331  AST 236* 238* 109* 52*  ALT 184* 256* 182* 143*  ALKPHOS 125 109 97 98  BILITOT 1.4* 3.0* 5.3* 2.0*  PROT 7.9 7.3 6.6 7.1  ALBUMIN 4.1 3.6 3.1* 3.2*   Recent Labs  Lab 06/15/23 2248 06/18/23 0331  LIPASE 44 25    Radiology Studies: DG ERCP Result Date: 06/17/2023 CLINICAL DATA:  67 year old female with biliary tract obstruction EXAM: ERCP TECHNIQUE: Multiple spot images obtained with the fluoroscopic device and submitted for interpretation post-procedure. FLUOROSCOPY: Radiation Exposure Index (as provided by the fluoroscopic device): 35 mGy Kerma COMPARISON:  MR 06/16/2023 FINDINGS: Limited intraoperative fluoroscopic spot images  during ERCP. Initial image demonstrates the endoscope projecting over the upper abdomen with surgical changes of cholecystectomy. There is subsequent placement of a safety wire into the extrahepatic biliary ducts and retrograde infusion of contrast. Geometric filling defects in the bile duct. Extrahepatic ductal dilatation. Passage of a retrieval balloon/balloon catheter. Placement of a plastic biliary stent. IMPRESSION: Limited images during ERCP demonstrates treatment of choledocholithiasis with balloon/PTA and placement of plastic biliary stent. Please refer to the dictated operative report for full details of intraoperative findings and procedure. Electronically Signed   By: Ami Bellman D.O.   On: 06/17/2023 16:03    Scheduled Meds:  atenolol   50 mg Oral Daily   pantoprazole   40 mg Oral Daily   Continuous Infusions:  piperacillin -tazobactam (ZOSYN )  IV 3.375 g (06/18/23 0600)     LOS: 2 days   Time spent: 40 minutes  Camellia Door, DO  [facility]  06/18/2023, 11:12 AM

## 2023-06-18 NOTE — Telephone Encounter (Signed)
Please see note below and advise  

## 2023-06-18 NOTE — Plan of Care (Signed)
  Problem: Education: Goal: Knowledge of General Education information will improve Description: Including pain rating scale, medication(s)/side effects and non-pharmacologic comfort measures Outcome: Progressing   Problem: Health Behavior/Discharge Planning: Goal: Ability to manage health-related needs will improve Outcome: Progressing   Problem: Clinical Measurements: Goal: Will remain free from infection Outcome: Progressing   Problem: Pain Management: Goal: General experience of comfort will improve Outcome: Progressing

## 2023-06-18 NOTE — Assessment & Plan Note (Signed)
 06-18-2023 Scr 1.41. baseline Scr 1.2-1.5

## 2023-06-18 NOTE — Assessment & Plan Note (Signed)
 06-18-2023 likely due to choledocholithiasis. She will need repeat LFTs when she f/u with GI in office.

## 2023-06-18 NOTE — Plan of Care (Signed)
  Problem: Activity: Goal: Risk for activity intolerance will decrease Outcome: Progressing   Problem: Nutrition: Goal: Adequate nutrition will be maintained Outcome: Progressing   Problem: Elimination: Goal: Will not experience complications related to urinary retention Outcome: Progressing   Problem: Pain Management: Goal: General experience of comfort will improve Outcome: Progressing   Problem: Safety: Goal: Ability to remain free from injury will improve Outcome: Progressing   Problem: Skin Integrity: Goal: Risk for impaired skin integrity will decrease Outcome: Progressing

## 2023-06-20 ENCOUNTER — Encounter (HOSPITAL_COMMUNITY): Payer: Self-pay | Admitting: Gastroenterology

## 2023-06-25 ENCOUNTER — Other Ambulatory Visit: Payer: Self-pay

## 2023-06-25 ENCOUNTER — Telehealth: Payer: Self-pay

## 2023-06-25 DIAGNOSIS — K8050 Calculus of bile duct without cholangitis or cholecystitis without obstruction: Secondary | ICD-10-CM

## 2023-06-25 NOTE — Telephone Encounter (Signed)
 Please set her up for ERCP during my hospital week in 6 to 8 weeks RG

## 2023-06-25 NOTE — Telephone Encounter (Signed)
 Pt was made aware of Dr. Charlanne recommendations:  Pt was ordered and scheduled for an ERCP with Biliary Stent Removal on 08/26/2023 at 10:30 AM at [facility]. Pt made aware. Case ID [id number]. Prep instructions were sent to pt  via my chart.  Pt made aware. Ambulatory referral to GI placed in Epic.  Pt verbalized understanding with all questions answered.

## 2023-06-25 NOTE — Telephone Encounter (Signed)
-----   Message from Lynnie Bring sent at 06/18/2023 12:57 PM EST ----- Regarding: RE: outpatient follow up - ERCP Hi Marcey, I will take care of it Anselm Standing, Pl set her up for ERCP with stent removal in 6-8 weeks. Can schedule her at [facility] or [facility], when I am purple/yellow. RG ----- Message ----- From: Leigh Standing SQUIBB, MD Sent: 06/18/2023  11:03 AM EST To: Naomie LOISE Sharps, RN; Lynnie Bring, MD Subject: outpatient follow up - ERCP                    Hi Anselm,  Thanks for helping with her ERCP. She has asked to follow up with us  for long term GI care, her prior GI physician left their practice. She needs a repeat ERCP in 6-8 weeks. Is that something you want to do or do post hospital follow up cases go to [facility]? Just let us  know so we can coordinate. Gl[facility] will be her long term GI here at [facility]. Thanks  Dottie can you add Dr. Ira nurse to string, not sure who covers him, for scheduling.   Marcey

## 2023-06-26 NOTE — Telephone Encounter (Signed)
 Pt made aware of Dr. Charlanne recommendations: Pt was scheduled for the ERCP on 08/26/2023 at 10:30 AM with Dr. Charlanne at [facility].  Ambulatory referral to GI was placed in EPIC. Prep Letter sent to pt via My Chart. Pt verbalized understanding with all questions answered.

## 2023-07-02 DIAGNOSIS — Z09 Encounter for follow-up examination after completed treatment for conditions other than malignant neoplasm: Secondary | ICD-10-CM | POA: Diagnosis not present

## 2023-07-02 DIAGNOSIS — K8050 Calculus of bile duct without cholangitis or cholecystitis without obstruction: Secondary | ICD-10-CM | POA: Diagnosis not present

## 2023-07-02 DIAGNOSIS — R945 Abnormal results of liver function studies: Secondary | ICD-10-CM | POA: Diagnosis not present

## 2023-07-15 ENCOUNTER — Ambulatory Visit
Admission: RE | Admit: 2023-07-15 | Discharge: 2023-07-15 | Disposition: A | Discharge status: Home / Self Care | Payer: Medicare Other | Source: Ambulatory Visit | Attending: Nurse Practitioner | Admitting: Nurse Practitioner

## 2023-07-15 VITALS — BP 149/78 | HR 73 | Temp 98.4°F | Resp 18

## 2023-07-15 DIAGNOSIS — M79671 Pain in right foot: Secondary | ICD-10-CM | POA: Diagnosis not present

## 2023-07-15 DIAGNOSIS — Z8739 Personal history of other diseases of the musculoskeletal system and connective tissue: Secondary | ICD-10-CM

## 2023-07-15 MED ORDER — KETOROLAC TROMETHAMINE 30 MG/ML IJ SOLN
15.0000 mg | Freq: Once | INTRAMUSCULAR | Status: AC
  Administered 2023-07-15: 15 mg via INTRAMUSCULAR

## 2023-07-15 MED ORDER — PREDNISONE 20 MG PO TABS: ORAL_TABLET | ORAL | 0 refills | Status: AC

## 2023-07-15 NOTE — ED Provider Notes (Signed)
[facility]    CSN: [id number] Arrival date & time: 07/15/23  0954      History   Chief Complaint Chief Complaint  Patient presents with   Foot Pain    Entered by patient    HPI Traci Chandler is a 68 y.o. female.   Patient presents today with right lateral foot pain, swelling, redness, and warmth that began 4 days ago.  She denies any recent fall, trauma, or known injury to that side of her foot.  She does report history of foot pain in the same area that improved with oral steroid medicine.  She has been taking previously prescribed narcotic pain medicine for gallbladder surgery that she had last month which helps with the pain temporarily, but does not take it away.  She denies any numbness or tingling in her toes.  She has pain worse with walking or moving the foot.  No fevers or nausea/vomiting since the pain began.    Past Medical History:  Diagnosis Date   Essential hypertension    GERD (gastroesophageal reflux disease)    Hypertrophic non-obstructive cardiomyopathy (HCC)    Echocardiogram 2011    Patient Active Problem List   Diagnosis Date Noted   Choledocholithiasis 06/16/2023   Nausea & vomiting 06/16/2023   Chronic kidney disease, stage 3b (HCC) - baseline Scr 1.2-1.5 06/16/2023   Elevated liver function tests 06/16/2023   Obesity, Class II, BMI 35-39.9 06/16/2023   Precordial pain 01/11/2023   Morbid obesity (HCC) 01/11/2023   Aortic atherosclerosis (HCC) 01/11/2023   Mixed hyperlipidemia 01/11/2023   1st degree AV block 01/11/2023   Empyema of gallbladder 03/06/2021   Biliary colic 03/02/2021   Calculus of gallbladder without cholecystitis without obstruction    GERD (gastroesophageal reflux disease) 01/14/2018   Abdominal pain 01/14/2018   Encounter for screening colonoscopy 04/02/2013   Hypertrophic cardiomyopathy (HCC) 09/12/2011   Essential hypertension, benign 09/12/2011    Past Surgical History:  Procedure Laterality Date    ABDOMINAL HYSTERECTOMY     BILIARY STENT PLACEMENT  06/17/2023   Procedure: BILIARY STENT PLACEMENT;  Surgeon: Lynann Bologna, MD;  Location: [facility] ENDOSCOPY;  Service: Gastroenterology;;   CHOLECYSTECTOMY N/A 03/07/2021   Procedure: LAPAROSCOPIC CHOLECYSTECTOMY;  Surgeon: [name], MD;  Location: [facility] ORS;  Service: General;  Laterality: N/A;   COLONOSCOPY N/A 04/15/2013   Procedure: COLONOSCOPY;  Surgeon: West Bali, MD;  Location: [facility] ENDO SUITE;  Service: Endoscopy;  Laterality: N/A;  9:30   ERCP N/A 06/17/2023   Procedure: ENDOSCOPIC RETROGRADE CHOLANGIOPANCREATOGRAPHY (ERCP);  Surgeon: Lynann Bologna, MD;  Location: [facility] ENDOSCOPY;  Service: Gastroenterology;  Laterality: N/A;   REMOVAL OF STONES  06/17/2023   Procedure: REMOVAL OF STONES;  Surgeon: Lynann Bologna, MD;  Location[facility] ENDOSCOPY;  Service: Gastroenterology;;   Dennison Mascot  06/17/2023   Procedure: Dennison Mascot;  Surgeon: Lynann Bologna, MD;  Location: [facility] ENDOSCOPY;  Service: Gastroenterology;;    OB History   No obstetric history on file.      Home Medications    Prior to Admission medications   Medication Sig Start Date End Date Taking? Authorizing Provider  predniSONE (DELTASONE) 20 MG tablet Take 3 tablets (60mg ) on days 1-2. Take 2 tablets (40mg ) on days 3-4. Take 1 tablet (20mg ) on days 5-6, then stop. 07/15/23  Yes Valentino Nose, NP  aspirin 81 MG tablet Take 81 mg by mouth every evening.     [provider]  atenolol (TENORMIN) 50 MG tablet Take 1 tablet by  mouth daily. 02/11/18   [provider]  NIFEdipine (PROCARDIA XL/NIFEDICAL XL) 60 MG 24 hr tablet Take 60 mg by mouth daily. 07/09/19   [provider]  omeprazole (PRILOSEC) 20 MG capsule TAKE 1 CAPSULE 2 TIMES A DAY BEFORE A MEAL. Patient taking differently: Take 20 mg by mouth 2 (two) times daily before a meal. 08/10/19   Anice Paganini, NP  ondansetron (ZOFRAN) 4 MG tablet Take 1 tablet (4 mg total) by mouth every 6  (six) hours as needed for nausea or vomiting. 06/18/23   Carollee Herter, DO  rosuvastatin (CRESTOR) 5 MG tablet Take 1 tablet (5 mg total) by mouth daily. Patient not taking: Reported on 06/16/2023 05/13/23   Christell Constant, MD  spironolactone (ALDACTONE) 25 MG tablet Take 25 mg by mouth 2 (two) times daily.    [provider]    Family History Family History  Problem Relation Age of Onset   Diabetes type II Mother    Hypertension Mother    Diabetes type II Sister    Hypertension Sister    Prostate cancer Father    Colon cancer Neg Hx     Social History Social History   Tobacco Use   Smoking status: Never   Smokeless tobacco: Never  Vaping Use   Vaping status: Never Used  Substance Use Topics   Alcohol use: No   Drug use: No     Allergies   Codeine, Diclofenac, and Lisinopril   Review of Systems Review of Systems Per HPI  Physical Exam Triage Vital Signs ED Triage Vitals [07/15/23 1013]  Encounter Vitals Group     BP (!) 149/78     Systolic BP Percentile      Diastolic BP Percentile      Pulse Rate 73     Resp 18     Temp 98.4 F (36.9 C)     Temp Source Oral     SpO2 93 %     Weight      Height      Head Circumference      Peak Flow      Pain Score 8     Pain Loc      Pain Education      Exclude from Growth Chart    No data found.  Updated Vital Signs BP (!) 149/78 (BP Location: Right Arm)   Pulse 73   Temp 98.4 F (36.9 C) (Oral)   Resp 18   SpO2 93%   Visual Acuity Right Eye Distance:   Left Eye Distance:   Bilateral Distance:    Right Eye Near:   Left Eye Near:    Bilateral Near:     Physical Exam Vitals and nursing note reviewed.  Constitutional:      General: She is not in acute distress.    Appearance: Normal appearance. She is not toxic-appearing.  HENT:     Mouth/Throat:     Mouth: Mucous membranes are moist.     Pharynx: Oropharynx is clear.  Pulmonary:     Effort: Pulmonary effort is normal. No  respiratory distress.  Musculoskeletal:     Comments: Inspection: Mild swelling and erythema noted to right lateral foot near base of fifth metatarsal bone; no obvious deformity Palpation: tender to palpation base of right fifth metatarsal; no obvious deformities palpated: Mild warmth appreciated to right foot ROM: Difficult to assess secondary to pain Strength: Difficult to assess secondary to pain Neurovascular: neurovascularly intact in bilateral lower  extremities   Skin:    General: Skin is warm and dry.     Capillary Refill: Capillary refill takes less than 2 seconds.     Coloration: Skin is not jaundiced or pale.     Findings: No erythema.  Neurological:     Mental Status: She is alert and oriented to person, place, and time.  Psychiatric:        Behavior: Behavior is cooperative.      UC Treatments / Results  Labs (all labs ordered are listed, but only abnormal results are displayed) Labs Reviewed - No data to display  EKG   Radiology No results found.  Procedures Procedures (including critical care time)  Medications Ordered in UC Medications  ketorolac (TORADOL) 30 MG/ML injection 15 mg (has no administration in time range)    Initial Impression / Assessment and Plan / UC Course  I have reviewed the triage vital signs and the nursing notes.  Pertinent labs & imaging results that were available during my care of the patient were reviewed by me and considered in my medical decision making (see chart for details).   Patient is well-appearing, normotensive, afebrile, not tachycardic, not tachypneic, oxygenating well on room air.    1. Right foot pain 2. History of gout Overall, vitals and exam are reassuring today Suspicious for acute gout, we discussed location is atypical for gout We will give Toradol 15 mg IM in urgent care today; most recent GFR slightly decreased, so therefore dose lowered Start oral prednisone in addition Recommended follow-up with  podiatry and PCP, especially if symptoms do not improve or recur despite treatment  The patient was given the opportunity to ask questions.  All questions answered to their satisfaction.  The patient is in agreement to this plan.    Final Clinical Impressions(s) / UC Diagnoses   Final diagnoses:  Right foot pain  History of gout     Discharge Instructions      We gave you an antiinflammatory injection today to help with pain.  Start taking the oral prednisone and take as prescribed.  Plan to follow up with your Podiatrist and/or PCP for recurrent foot pain in the same area.    ED Prescriptions     Medication Sig Dispense Auth. Provider   predniSONE (DELTASONE) 20 MG tablet Take 3 tablets (60mg ) on days 1-2. Take 2 tablets (40mg ) on days 3-4. Take 1 tablet (20mg ) on days 5-6, then stop. 12 tablet Valentino Nose, NP      PDMP not reviewed this encounter.   Valentino Nose, NP 07/15/23 1054

## 2023-07-15 NOTE — Discharge Instructions (Signed)
We gave you an antiinflammatory injection today to help with pain.  Start taking the oral prednisone and take as prescribed.  Plan to follow up with your Podiatrist and/or PCP for recurrent foot pain in the same area.

## 2023-07-15 NOTE — ED Triage Notes (Signed)
Pt reports right foot pain and swelling x 6 days. Pt denies injury to the foot.

## 2023-07-23 ENCOUNTER — Ambulatory Visit: Payer: Medicare Other | Admitting: Podiatry

## 2023-07-23 ENCOUNTER — Encounter: Payer: Self-pay | Admitting: Podiatry

## 2023-07-23 DIAGNOSIS — M1A4710 Other secondary chronic gout, right ankle and foot, without tophus (tophi): Secondary | ICD-10-CM

## 2023-07-23 MED ORDER — COLCHICINE 0.6 MG PO TABS: ORAL_TABLET | ORAL | 3 refills | Status: AC

## 2023-07-23 NOTE — Progress Notes (Signed)
  Subjective:  Patient ID: Traci Chandler, female    DOB: 11/24/1955,  MRN: [id number]  Chief Complaint  Patient presents with   Foot Pain    Patient states her right foot had pain in it so she went to urgent care and they told her togo see a podiatries, patient states it was the whole foot but know it is just the outer side of her right foot. Patient was taking medication for pain but she ran out .     68 y.o. female presents with the above complaint. History confirmed with patient.  She completed the prednisone  and is feeling better only minimal pain remains  Objective:  Physical Exam: warm, good capillary refill, no trophic changes or ulcerative lesions, normal DP and PT pulses, normal sensory exam, and today has pain in the sinus tarsi to palpation less over the midfoot, improving.  Assessment:   1. 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  Suspect she had a acute on chronic gout flare she had recent hospitalizations and is currently dealing with gallstones and has an upcoming stent removal.  We again discussed a low purine eating plan discussed that dairy product such as cheese can cause this.  Information on this was given to her.  I prescribed her colchicine  to use as needed she should do a course of this if not improving after next week and would return as needed to return for corticosteroid injection into the sinus tarsi which we again discussed as an option today but she declined.  I do think she likely needs chronic management of this and referral will be placed to rheumatology Return if symptoms worsen or fail to improve.

## 2023-07-30 ENCOUNTER — Telehealth: Payer: Self-pay

## 2023-07-30 NOTE — Telephone Encounter (Signed)
Referral, office note and demographics faxed to Dr. Deanne Coffer @ [facility] on [address] Phone [phone] Fax [phone]

## 2023-07-30 NOTE — Telephone Encounter (Signed)
-----   Message from Edwin Cap sent at 07/23/2023 11:13 AM EST ----- Can you fax this referral to Dr. Deanne Coffer at [facility]?  Thank you!

## 2023-08-07 DIAGNOSIS — M79671 Pain in right foot: Secondary | ICD-10-CM | POA: Diagnosis not present

## 2023-08-07 DIAGNOSIS — M79672 Pain in left foot: Secondary | ICD-10-CM | POA: Diagnosis not present

## 2023-08-07 DIAGNOSIS — Z79899 Other long term (current) drug therapy: Secondary | ICD-10-CM | POA: Diagnosis not present

## 2023-08-07 DIAGNOSIS — M109 Gout, unspecified: Secondary | ICD-10-CM | POA: Diagnosis not present

## 2023-08-07 DIAGNOSIS — N1831 Chronic kidney disease, stage 3a: Secondary | ICD-10-CM | POA: Diagnosis not present

## 2023-08-07 DIAGNOSIS — M7989 Other specified soft tissue disorders: Secondary | ICD-10-CM | POA: Diagnosis not present

## 2023-08-19 ENCOUNTER — Encounter (HOSPITAL_COMMUNITY): Payer: Self-pay | Admitting: Gastroenterology

## 2023-08-20 ENCOUNTER — Telehealth: Payer: Self-pay

## 2023-08-26 ENCOUNTER — Other Ambulatory Visit: Payer: Self-pay

## 2023-08-26 ENCOUNTER — Ambulatory Visit (HOSPITAL_COMMUNITY): Payer: Self-pay | Admitting: Registered Nurse

## 2023-08-26 ENCOUNTER — Ambulatory Visit (HOSPITAL_COMMUNITY)

## 2023-08-26 ENCOUNTER — Ambulatory Visit (HOSPITAL_BASED_OUTPATIENT_CLINIC_OR_DEPARTMENT_OTHER): Payer: Self-pay | Admitting: Registered Nurse

## 2023-08-26 ENCOUNTER — Ambulatory Visit (HOSPITAL_COMMUNITY)
Admission: RE | Admit: 2023-08-26 | Discharge: 2023-08-26 | Disposition: A | Discharge status: Home / Self Care | Payer: Medicare Other | Source: Ambulatory Visit | Attending: Gastroenterology | Admitting: Gastroenterology

## 2023-08-26 ENCOUNTER — Encounter (HOSPITAL_COMMUNITY)
Admission: RE | Disposition: A | Discharge status: Home / Self Care | Payer: Self-pay | Source: Ambulatory Visit | Attending: Gastroenterology

## 2023-08-26 ENCOUNTER — Encounter (HOSPITAL_COMMUNITY): Payer: Self-pay | Admitting: Gastroenterology

## 2023-08-26 DIAGNOSIS — Z9049 Acquired absence of other specified parts of digestive tract: Secondary | ICD-10-CM | POA: Insufficient documentation

## 2023-08-26 DIAGNOSIS — Z4659 Encounter for fitting and adjustment of other gastrointestinal appliance and device: Secondary | ICD-10-CM | POA: Insufficient documentation

## 2023-08-26 DIAGNOSIS — I129 Hypertensive chronic kidney disease with stage 1 through stage 4 chronic kidney disease, or unspecified chronic kidney disease: Secondary | ICD-10-CM | POA: Diagnosis not present

## 2023-08-26 DIAGNOSIS — K5710 Diverticulosis of small intestine without perforation or abscess without bleeding: Secondary | ICD-10-CM

## 2023-08-26 DIAGNOSIS — N1832 Chronic kidney disease, stage 3b: Secondary | ICD-10-CM | POA: Diagnosis not present

## 2023-08-26 DIAGNOSIS — K8050 Calculus of bile duct without cholangitis or cholecystitis without obstruction: Secondary | ICD-10-CM

## 2023-08-26 DIAGNOSIS — I10 Essential (primary) hypertension: Secondary | ICD-10-CM | POA: Diagnosis not present

## 2023-08-26 DIAGNOSIS — K838 Other specified diseases of biliary tract: Secondary | ICD-10-CM | POA: Insufficient documentation

## 2023-08-26 DIAGNOSIS — K219 Gastro-esophageal reflux disease without esophagitis: Secondary | ICD-10-CM | POA: Diagnosis not present

## 2023-08-26 DIAGNOSIS — Z9889 Other specified postprocedural states: Secondary | ICD-10-CM

## 2023-08-26 DIAGNOSIS — Z4682 Encounter for fitting and adjustment of non-vascular catheter: Secondary | ICD-10-CM | POA: Diagnosis not present

## 2023-08-26 DIAGNOSIS — Z79899 Other long term (current) drug therapy: Secondary | ICD-10-CM | POA: Insufficient documentation

## 2023-08-26 DIAGNOSIS — K5790 Diverticulosis of intestine, part unspecified, without perforation or abscess without bleeding: Secondary | ICD-10-CM | POA: Diagnosis not present

## 2023-08-26 HISTORY — PX: STENT REMOVAL: SHX6421

## 2023-08-26 HISTORY — PX: ENDOSCOPIC RETROGRADE CHOLANGIOPANCREATOGRAPHY (ERCP) WITH PROPOFOL: SHX5810

## 2023-08-26 LAB — CBC
HCT: 40.7 % (ref 36.0–46.0)
Hemoglobin: 13.2 g/dL (ref 12.0–15.0)
MCH: 29.6 pg (ref 26.0–34.0)
MCHC: 32.4 g/dL (ref 30.0–36.0)
MCV: 91.3 fL (ref 80.0–100.0)
Platelets: 124 10*3/uL — ABNORMAL LOW (ref 150–400)
RBC: 4.46 MIL/uL (ref 3.87–5.11)
RDW: 12 % (ref 11.5–15.5)
WBC: 4.7 10*3/uL (ref 4.0–10.5)
nRBC: 0 % (ref 0.0–0.2)

## 2023-08-26 LAB — LIPASE, BLOOD: Lipase: 29 U/L (ref 11–51)

## 2023-08-26 LAB — COMPREHENSIVE METABOLIC PANEL
ALT: 16 U/L (ref 0–44)
AST: 17 U/L (ref 15–41)
Albumin: 3.6 g/dL (ref 3.5–5.0)
Alkaline Phosphatase: 56 U/L (ref 38–126)
Anion gap: 9 (ref 5–15)
BUN: 17 mg/dL (ref 8–23)
CO2: 24 mmol/L (ref 22–32)
Calcium: 9.4 mg/dL (ref 8.9–10.3)
Chloride: 106 mmol/L (ref 98–111)
Creatinine, Ser: 1.06 mg/dL — ABNORMAL HIGH (ref 0.44–1.00)
GFR, Estimated: 57 mL/min — ABNORMAL LOW (ref >60)
Glucose, Bld: 95 mg/dL (ref 70–99)
Potassium: 3.7 mmol/L (ref 3.5–5.1)
Sodium: 139 mmol/L (ref 135–145)
Total Bilirubin: 0.6 mg/dL (ref 0.0–1.2)
Total Protein: 7 g/dL (ref 6.5–8.1)

## 2023-08-26 SURGERY — ENDOSCOPIC RETROGRADE CHOLANGIOPANCREATOGRAPHY (ERCP) WITH PROPOFOL
Anesthesia: General

## 2023-08-26 MED ORDER — INDOMETHACIN 50 MG RE SUPP
RECTAL | Status: DC | PRN
  Administered 2023-08-26: 100 mg via RECTAL

## 2023-08-26 MED ORDER — SUGAMMADEX SODIUM 200 MG/2ML IV SOLN
INTRAVENOUS | Status: DC | PRN
  Administered 2023-08-26: 200 mg via INTRAVENOUS

## 2023-08-26 MED ORDER — MIDAZOLAM HCL 2 MG/2ML IJ SOLN
INTRAMUSCULAR | Status: AC
  Filled 2023-08-26: qty 2

## 2023-08-26 MED ORDER — GLUCAGON HCL RDNA (DIAGNOSTIC) 1 MG IJ SOLR
INTRAMUSCULAR | Status: AC
  Filled 2023-08-26: qty 1

## 2023-08-26 MED ORDER — FENTANYL CITRATE (PF) 100 MCG/2ML IJ SOLN
INTRAMUSCULAR | Status: AC
  Filled 2023-08-26: qty 2

## 2023-08-26 MED ORDER — ONDANSETRON HCL 4 MG/2ML IJ SOLN
INTRAMUSCULAR | Status: DC | PRN
  Administered 2023-08-26: 4 mg via INTRAVENOUS

## 2023-08-26 MED ORDER — GLUCAGON HCL RDNA (DIAGNOSTIC) 1 MG IJ SOLR
INTRAMUSCULAR | Status: DC | PRN
  Administered 2023-08-26: .2 mg via INTRAVENOUS

## 2023-08-26 MED ORDER — PROPOFOL 10 MG/ML IV BOLUS
INTRAVENOUS | Status: AC
Start: 2023-08-26 — End: ?
  Filled 2023-08-26: qty 20

## 2023-08-26 MED ORDER — FENTANYL CITRATE (PF) 100 MCG/2ML IJ SOLN
INTRAMUSCULAR | Status: DC | PRN
  Administered 2023-08-26 (×2): 25 ug via INTRAVENOUS
  Administered 2023-08-26: 50 ug via INTRAVENOUS

## 2023-08-26 MED ORDER — SODIUM CHLORIDE 0.9 % IV SOLN
INTRAVENOUS | Status: DC | PRN
  Administered 2023-08-26: 55 mL

## 2023-08-26 MED ORDER — PROPOFOL 10 MG/ML IV BOLUS
INTRAVENOUS | Status: DC | PRN
  Administered 2023-08-26: 150 mg via INTRAVENOUS

## 2023-08-26 MED ORDER — CIPROFLOXACIN IN D5W 400 MG/200ML IV SOLN
INTRAVENOUS | Status: AC
  Filled 2023-08-26: qty 200

## 2023-08-26 MED ORDER — INDOMETHACIN 50 MG RE SUPP
RECTAL | Status: AC
  Filled 2023-08-26: qty 1

## 2023-08-26 MED ORDER — SODIUM CHLORIDE 0.9 % IV SOLN: INTRAVENOUS | Status: DC

## 2023-08-26 MED ORDER — DEXAMETHASONE SODIUM PHOSPHATE 10 MG/ML IJ SOLN
INTRAMUSCULAR | Status: DC | PRN
  Administered 2023-08-26: 8 mg via INTRAVENOUS

## 2023-08-26 MED ORDER — PROPOFOL 10 MG/ML IV BOLUS
INTRAVENOUS | Status: AC
  Filled 2023-08-26: qty 20

## 2023-08-26 MED ORDER — LIDOCAINE 2% (20 MG/ML) 5 ML SYRINGE
INTRAMUSCULAR | Status: DC | PRN
  Administered 2023-08-26: 80 mg via INTRAVENOUS

## 2023-08-26 MED ORDER — ROCURONIUM BROMIDE 10 MG/ML (PF) SYRINGE
PREFILLED_SYRINGE | INTRAVENOUS | Status: DC | PRN
  Administered 2023-08-26: 50 mg via INTRAVENOUS

## 2023-08-26 MED ORDER — CIPROFLOXACIN IN D5W 400 MG/200ML IV SOLN
INTRAVENOUS | Status: DC | PRN
  Administered 2023-08-26: 400 mg via INTRAVENOUS

## 2023-08-26 NOTE — Discharge Instructions (Addendum)
 YOU HAD AN ENDOSCOPIC PROCEDURE TODAY: Refer to the procedure report and other information in the discharge instructions given to you for any specific questions about what was found during the examination. If this information does not answer your questions, please call [place] office at [phone] to clarify.   YOU SHOULD EXPECT: Some feelings of bloating in the abdomen. Passage of more gas than usual. Walking can help get rid of the air that was put into your GI tract during the procedure and reduce the bloating.  DIET: You may have a full liquid diet today. Beginning tomorrow, you may progress back to a normal diet as tolerated. Drink plenty of fluids, but avoid alcohol for the next 24 hours.   ACTIVITY: Your care partner should take you home directly after the procedure. You should plan to take it easy, moving slowly for the rest of the day. You can resume normal activity the day after the procedure however YOU SHOULD NOT DRIVE, use power tools, machinery or perform tasks that involve climbing or major physical exertion for 24 hours (because of the sedation medicines used during the test).   SYMPTOMS TO REPORT IMMEDIATELY: A gastroenterologist can be reached at any hour. Please call [phone]  for any of the following symptoms:   Following upper endoscopy (EGD, EUS, ERCP, esophageal dilation) Vomiting of blood or coffee ground material  New, significant abdominal pain  New, significant chest pain or pain under the shoulder blades  Painful or persistently difficult swallowing  New shortness of breath  Black, tarry-looking or red, bloody stools  FOLLOW UP:  If any biopsies were taken you will be contacted by phone or by letter within the next 1-3 weeks. Call [phone]  if you have not heard about the biopsies in 3 weeks.  Please also call with any specific questions about appointments or follow up tests.

## 2023-08-26 NOTE — Transfer of Care (Signed)
 Immediate Anesthesia Transfer of Care Note  Patient: Traci Chandler  Procedure(s) Performed: ENDOSCOPIC RETROGRADE CHOLANGIOPANCREATOGRAPHY (ERCP) WITH PROPOFOL STENT REMOVAL  Patient Location: PACU  Anesthesia Type:General  Level of Consciousness: awake, alert , oriented, and patient cooperative  Airway & Oxygen Therapy: Patient Spontanous Breathing and Patient connected to face mask oxygen  Post-op Assessment: Report given to RN, Post -op Vital signs reviewed and stable, and Patient moving all extremities  Post vital signs: Reviewed and stable  Last Vitals:  Vitals Value Taken Time  BP 188/79 08/26/23 1122  Temp    Pulse 91 08/26/23 1122  Resp 14 08/26/23 1122  SpO2 100 % 08/26/23 1122    Last Pain:  Vitals:   08/26/23 1122  TempSrc:   PainSc: 0-No pain         Complications: No notable events documented.

## 2023-08-26 NOTE — Anesthesia Procedure Notes (Signed)
 Procedure Name: Intubation Date/Time: 08/26/2023 10:22 AM  Performed by: Elisabeth Cara, CRNAPre-anesthesia Checklist: Patient identified, Emergency Drugs available, Suction available, Timeout performed and Patient being monitored Patient Re-evaluated:Patient Re-evaluated prior to induction Oxygen Delivery Method: Circle system utilized Preoxygenation: Pre-oxygenation with 100% oxygen Induction Type: IV induction Ventilation: Mask ventilation without difficulty Laryngoscope Size: Mac and 4 Grade View: Grade I Tube type: Oral Tube size: 7.5 mm Number of attempts: 1 Airway Equipment and Method: Stylet Placement Confirmation: ETT inserted through vocal cords under direct vision, positive ETCO2 and breath sounds checked- equal and bilateral Secured at: 22 cm Tube secured with: Tape Dental Injury: Teeth and Oropharynx as per pre-operative assessment

## 2023-08-26 NOTE — H&P (Signed)
 [facility] History and Physical   Primary Care Physician:  Irena Reichmann, DO   Reason for Procedure:     History of choledocholithiasis  Plan:     ERCP with stent removal/cholangiogram.   Check CBC, CMP, lipase.     HPI: Traci Chandler is a 68 y.o. female   With history of choledocholithiasis s/ p biliary sphincterotomy/ sphincteroplasty and balloon extraction. Followed by Endo biliary stenting - Previous cholecystectomy. - Periampullary diverticula  06/17/2023  for stent removal and repeat cholangiogram. Past Medical History:  Diagnosis Date   Essential hypertension    GERD (gastroesophageal reflux disease)    Hypertrophic non-obstructive cardiomyopathy (HCC)    Echocardiogram 2011    Past Surgical History:  Procedure Laterality Date   ABDOMINAL HYSTERECTOMY     BILIARY STENT PLACEMENT  06/17/2023   Procedure: BILIARY STENT PLACEMENT;  Surgeon: Lynann Bologna, MD;  Location: [facility] ENDOSCOPY;  Service: Gastroenterology;;   CHOLECYSTECTOMY N/A 03/07/2021   Procedure: LAPAROSCOPIC CHOLECYSTECTOMY;  Surgeon: [name], MD;  Location: [facility] ORS;  Service: General;  Laterality: N/A;   COLONOSCOPY N/A 04/15/2013   Procedure: COLONOSCOPY;  Surgeon: West Bali, MD;  Location: [facility] ENDO SUITE;  Service: Endoscopy;  Laterality: N/A;  9:30   ERCP N/A 06/17/2023   Procedure: ENDOSCOPIC RETROGRADE CHOLANGIOPANCREATOGRAPHY (ERCP);  Surgeon: Lynann Bologna, MD;  Location: [facility] ENDOSCOPY;  Service: Gastroenterology;  Laterality: N/A;   REMOVAL OF STONES  06/17/2023   Procedure: REMOVAL OF STONES;  Surgeon: Lynann Bologna, MD;  Location: [facility] ENDOSCOPY;  Service: Gastroenterology;;   Dennison Mascot  06/17/2023   Procedure: Dennison Mascot;  Surgeon: Lynann Bologna, MD;  Location: [facility] ENDOSCOPY;  Service: Gastroenterology;;    Prior to Admission medications   Medication Sig Start Date End Date Taking? Authorizing Provider  aspirin 81 MG tablet Take 81 mg by mouth every evening.     Yes [provider]  atenolol (TENORMIN) 50 MG tablet Take 1 tablet by mouth daily. 02/11/18  Yes [provider]  colchicine 0.6 MG tablet Take 1.2mg  (2 tablets) then 0.6mg  (1 tablet) 1 hour after 07/23/23  Yes McDonald, Adam R, DPM  NIFEdipine (PROCARDIA XL/NIFEDICAL XL) 60 MG 24 hr tablet Take 60 mg by mouth daily. 07/09/19  Yes [provider]  omeprazole (PRILOSEC) 20 MG capsule TAKE 1 CAPSULE 2 TIMES A DAY BEFORE A MEAL. Patient taking differently: Take 20 mg by mouth 2 (two) times daily before a meal. 08/10/19  Yes Anice Paganini, NP  ondansetron (ZOFRAN) 4 MG tablet Take 1 tablet (4 mg total) by mouth every 6 (six) hours as needed for nausea or vomiting. 06/18/23  Yes Carollee Herter, DO  predniSONE (DELTASONE) 20 MG tablet Take 3 tablets (60mg ) on days 1-2. Take 2 tablets (40mg ) on days 3-4. Take 1 tablet (20mg ) on days 5-6, then stop. 07/15/23  Yes Cathlean Marseilles A, NP  rosuvastatin (CRESTOR) 5 MG tablet Take 1 tablet (5 mg total) by mouth daily. 05/13/23  Yes Chandrasekhar, Mahesh A, MD  spironolactone (ALDACTONE) 25 MG tablet Take 25 mg by mouth 2 (two) times daily.   Yes [provider]    Current Facility-Administered Medications  Medication Dose Route Frequency Provider Last Rate Last Admin   0.9 %  sodium chloride infusion   Intravenous Continuous Lynann Bologna, MD        Allergies as of 06/25/2023 - Review Complete 06/17/2023  Allergen Reaction Noted   Codeine Nausea And Vomiting 09/12/2011   Diclofenac Other (See Comments) 01/08/2018  Lisinopril Other (See Comments) 09/10/2016    Family History  Problem Relation Age of Onset   Diabetes type II Mother    Hypertension Mother    Diabetes type II Sister    Hypertension Sister    Prostate cancer Father    Colon cancer Neg Hx     Social History   Socioeconomic History   Marital status: Married    Spouse name: Not on file   Number of children: Not on file   Years of education: Not on file    Highest education level: Not on file  Occupational History   Occupation: employed    Employer: [employer]    Comment: [employer]  Tobacco Use   Smoking status: Never   Smokeless tobacco: Never  Vaping Use   Vaping status: Never Used  Substance and Sexual Activity   Alcohol use: No   Drug use: No   Sexual activity: Yes    Birth control/protection: Surgical  Other Topics Concern   Not on file  Social History Narrative   Not on file   Social Drivers of Health   Financial Resource Strain: Not on file  Food Insecurity: No Food Insecurity (06/16/2023)   Hunger Vital Sign    Worried About Running Out of Food in the Last Year: Never true    Ran Out of Food in the Last Year: Never true  Transportation Needs: No Transportation Needs (06/16/2023)   PRAPARE - Administrator, Civil Service (Medical): No    Lack of Transportation (Non-Medical): No  Physical Activity: Not on file  Stress: Not on file  Social Connections: Socially Integrated (06/17/2023)   Social Connection and Isolation Panel [NHANES]    Frequency of Communication with Friends and Family: More than three times a week    Frequency of Social Gatherings with Friends and Family: More than three times a week    Attends Religious Services: More than 4 times per year    Active Member of [employer] or Organizations: Yes    Attends Banker Meetings: 1 to 4 times per year    Marital Status: Married  Catering manager Violence: Not At Risk (06/16/2023)   Humiliation, Afraid, Rape, and Kick questionnaire    Fear of Current or Ex-Partner: No    Emotionally Abused: No    Physically Abused: No    Sexually Abused: No    Review of Systems: Positive for none All other review of systems negative except as mentioned in the HPI.  Physical Exam: Vital signs in last 24 hours: Temp:  [97.4 F (36.3 C)] 97.4 F (36.3 C) (03/10 0906) Pulse Rate:  [76] 76 (03/10 0906) Resp:  [13] 13 (03/10  0906) BP: (174)/(74) 174/74 (03/10 0906) SpO2:  [98 %] 98 % (03/10 0906) Weight:  [105.2 kg] 105.2 kg (03/10 0906)   General:   Alert,  Well-developed, well-nourished, pleasant and cooperative in NAD Lungs:  Clear throughout to auscultation.   Heart:  Regular rate and rhythm; no murmurs, clicks, rubs,  or gallops. Abdomen:  Soft, nontender and nondistended. Normal bowel sounds.   Neuro/Psych:  Alert and cooperative. Normal mood and affect. A and O x 3    No significant changes were identified.  The patient continues to be an appropriate candidate for the planned procedure and anesthesia.   Edman Circle, MD. [facility] 08/26/2023 9:54 AM@

## 2023-08-26 NOTE — Anesthesia Preprocedure Evaluation (Addendum)
 Anesthesia Evaluation  Patient identified by MRN, date of birth, ID band Patient awake    Reviewed: Allergy & Precautions, NPO status , Patient's Chart, lab work & pertinent test results  Airway Mallampati: III  TM Distance: >3 FB Neck ROM: Full    Dental no notable dental hx. (+) Teeth Intact, Dental Advisory Given   Pulmonary neg pulmonary ROS   Pulmonary exam normal breath sounds clear to auscultation       Cardiovascular hypertension, Pt. on home beta blockers and Pt. on medications Normal cardiovascular exam+ dysrhythmias  Rhythm:Regular Rate:Normal     Neuro/Psych negative neurological ROS  negative psych ROS   GI/Hepatic Neg liver ROS,GERD  ,,  Endo/Other  negative endocrine ROS    Renal/GU Renal InsufficiencyRenal disease  negative genitourinary   Musculoskeletal negative musculoskeletal ROS (+)    Abdominal   Peds  Hematology negative hematology ROS (+)   Anesthesia Other Findings   Reproductive/Obstetrics                             Anesthesia Physical Anesthesia Plan  ASA: 3  Anesthesia Plan: General   Post-op Pain Management:    Induction: Intravenous  PONV Risk Score and Plan: 3 and Midazolam, Dexamethasone and Ondansetron  Airway Management Planned: Oral ETT  Additional Equipment:   Intra-op Plan:   Post-operative Plan: Extubation in OR  Informed Consent: I have reviewed the patients History and Physical, chart, labs and discussed the procedure including the risks, benefits and alternatives for the proposed anesthesia with the patient or authorized representative who has indicated his/her understanding and acceptance.     Dental advisory given  Plan Discussed with: CRNA  Anesthesia Plan Comments:        Anesthesia Quick Evaluation

## 2023-08-26 NOTE — Op Note (Signed)
 [facility] Patient Name: Traci Chandler Procedure Date: 08/26/2023 MRN: [id number] Attending MD: Lynann Bologna , MD, [id number] Date of Birth: 24-Feb-1956 CSN: [id number] Age: 68 Admit Type: Ambulatory Procedure:                ERCP Indications:              H/O significant choledocholithiasis s/p biliary                            sphincterotomy/sphincteroplasty and insertion of                            Endo biliary stenting 06/17/2023. Here for repeat                            ERCP and biliary stent removal Providers:                Lynann Bologna, MD Referring MD:             Dr. Adela Lank. Medicines:                General Anesthesia Complications:            No immediate complications. Estimated Blood Loss:     Estimated blood loss: none. Procedure:                Pre-Anesthesia Assessment:                           - Prior to the procedure, a History and Physical                            was performed, and patient medications and                            allergies were reviewed. The patient's tolerance of                            previous anesthesia was also reviewed. The risks                            and benefits of the procedure and the sedation                            options and risks were discussed with the patient.                            All questions were answered, and informed consent                            was obtained. Prior Anticoagulants: The patient has                            taken no anticoagulant or antiplatelet agents. ASA                            Grade Assessment: II -  A patient with mild systemic                            disease. After reviewing the risks and benefits,                            the patient was deemed in satisfactory condition to                            undergo the procedure.                           After obtaining informed consent, the scope was                            passed under direct  vision. Throughout the                            procedure, the patient's blood pressure, pulse, and                            oxygen saturations were monitored continuously. The                            [id number]-Q190V ([id number] Olympus duodenoscope was                            introduced through the mouth, and used to inject                            contrast into and used to inject contrast into the                            bile duct. The ERCP was accomplished without                            difficulty. The patient tolerated the procedure                            well. Scope In: Scope Out: Findings:      A scout film of the abdomen was obtained showing air cholangiogram.       Surgical clips, consistent with a previous cholecystectomy and a stent,       were seen in the area of the right upper quadrant of the abdomen. The       esophagus was successfully intubated under direct vision. The scope was       advanced to major papilla in the descending duodenum without detailed       examination of the pharynx, larynx and associated structures, and upper       GI tract. The upper GI tract was grossly normal. 10Fr, 7 cm stent was       removed using a snare. The sphincterotomy was widely open. 2       periampullary diverticula were noted.      The bile duct was deeply cannulated with the  short-nosed traction       sphincterotome using a wire-guided approach. Contrast was injected. I       personally interpreted the bile duct images. Ductal flow of contrast was       adequate. Image quality was adequate. Contrast extended to the entire       biliary tree except GB. The common bile duct was dilated at 12 mm       without any definite filling defects. To discover objects, the biliary       tree was swept with a 15 mm balloon starting at the bifurcation several       times. Nothing was found. Postocclusion cholangiogram was obtained.      PD was intentionally not cannulated or injected.  Indocin suppositories       were given prior to insertion of the scope. Impression:               - Dilated CBD with air cholangiogram. Widely patent                            sphincterotomy.                           - Removal of previously placed 10 French 6 cm                            plastic stent.                           - The biliary tree was swept and nothing was found.                           - S/p previous cholecystectomy. Moderate Sedation:      Not Applicable - Patient had care per Anesthesia. Recommendation:           - Discharge patient to home.                           - Clear liquid diet today. Can advance to normal                            diet in AM.                           - Watch for pancreatitis, bleeding, perforation,                            and cholangitis.                           - Follow-up with Dr. Adela Lank as needed.                           - Check CBC, CMP, lipase as planned                           - The findings and recommendations were discussed  with the patient's family. Procedure Code(s):        --- Professional ---                           [phone], Endoscopic retrograde                            cholangiopancreatography (ERCP); with removal of                            foreign body(s) or stent(s) from biliary/pancreatic                            duct(s)                           [phone], Endoscopic catheterization of the biliary                            ductal system, radiological supervision and                            interpretation Diagnosis Code(s):        --- Professional ---                           U98.11, Encounter for fitting and adjustment of                            other gastrointestinal appliance and device CPT copyright 2022 American Medical Association. All rights reserved. The codes documented in this report are preliminary and upon coder review may  be revised to meet current  compliance requirements. Lynann Bologna, MD 08/26/2023 11:17:28 AM This report has been signed electronically. Number of Addenda: 0

## 2023-08-27 NOTE — Anesthesia Postprocedure Evaluation (Signed)
 Anesthesia Post Note  Patient: Traci Chandler  Procedure(s) Performed: ENDOSCOPIC RETROGRADE CHOLANGIOPANCREATOGRAPHY (ERCP) WITH PROPOFOL STENT REMOVAL     Patient location during evaluation: Endoscopy Anesthesia Type: General Level of consciousness: awake and alert Pain management: pain level controlled Vital Signs Assessment: post-procedure vital signs reviewed and stable Respiratory status: spontaneous breathing, nonlabored ventilation, respiratory function stable and patient connected to nasal cannula oxygen Cardiovascular status: blood pressure returned to baseline and stable Postop Assessment: no apparent nausea or vomiting Anesthetic complications: no  No notable events documented.  Last Vitals:  Vitals:   08/26/23 1130 08/26/23 1140  BP: (!) 166/72 (!) 151/59  Pulse: 79 72  Resp: 14 13  Temp:    SpO2: 96% 92%    Last Pain:  Vitals:   08/26/23 1140  TempSrc:   PainSc: 0-No pain                 Lanell Carpenter L Carlyn Lemke

## 2023-08-28 ENCOUNTER — Encounter (HOSPITAL_COMMUNITY): Payer: Self-pay | Admitting: Gastroenterology

## 2023-08-28 NOTE — Progress Notes (Signed)
 Completed post-operative phone call for pt's ERCP with MD Chales Abrahams. Pt c/o sore throat that got better over the day yesterday but is worsening today. Advised pt to call [facility] office if pain continues to worsen or does not improve. Pt demonstrated understanding. MD Chales Abrahams notified via secure chat.  Eulas Post, RN 08/28/23 11:08 AM

## 2023-09-13 NOTE — Telephone Encounter (Signed)
error 

## 2023-09-18 DIAGNOSIS — N1831 Chronic kidney disease, stage 3a: Secondary | ICD-10-CM | POA: Diagnosis not present

## 2023-09-18 DIAGNOSIS — M109 Gout, unspecified: Secondary | ICD-10-CM | POA: Diagnosis not present

## 2023-09-18 DIAGNOSIS — Z79899 Other long term (current) drug therapy: Secondary | ICD-10-CM | POA: Diagnosis not present

## 2024-01-02 DIAGNOSIS — Z79899 Other long term (current) drug therapy: Secondary | ICD-10-CM | POA: Diagnosis not present

## 2024-01-02 DIAGNOSIS — N1831 Chronic kidney disease, stage 3a: Secondary | ICD-10-CM | POA: Diagnosis not present

## 2024-01-02 DIAGNOSIS — M109 Gout, unspecified: Secondary | ICD-10-CM | POA: Diagnosis not present

## 2024-01-28 DIAGNOSIS — R748 Abnormal levels of other serum enzymes: Secondary | ICD-10-CM | POA: Diagnosis not present

## 2024-02-04 DIAGNOSIS — K0889 Other specified disorders of teeth and supporting structures: Secondary | ICD-10-CM | POA: Diagnosis not present

## 2024-02-04 DIAGNOSIS — I129 Hypertensive chronic kidney disease with stage 1 through stage 4 chronic kidney disease, or unspecified chronic kidney disease: Secondary | ICD-10-CM | POA: Diagnosis not present

## 2024-02-04 DIAGNOSIS — R7309 Other abnormal glucose: Secondary | ICD-10-CM | POA: Diagnosis not present

## 2024-02-04 DIAGNOSIS — R946 Abnormal results of thyroid function studies: Secondary | ICD-10-CM | POA: Diagnosis not present

## 2024-02-04 DIAGNOSIS — N1831 Chronic kidney disease, stage 3a: Secondary | ICD-10-CM | POA: Diagnosis not present

## 2024-02-04 DIAGNOSIS — Z0000 Encounter for general adult medical examination without abnormal findings: Secondary | ICD-10-CM | POA: Diagnosis not present

## 2024-02-04 DIAGNOSIS — E7800 Pure hypercholesterolemia, unspecified: Secondary | ICD-10-CM | POA: Diagnosis not present

## 2024-02-04 DIAGNOSIS — I422 Other hypertrophic cardiomyopathy: Secondary | ICD-10-CM | POA: Diagnosis not present

## 2024-03-11 ENCOUNTER — Encounter: Payer: Self-pay | Admitting: Physician Assistant

## 2024-03-11 ENCOUNTER — Ambulatory Visit: Admitting: Physician Assistant

## 2024-03-11 VITALS — BP 133/82 | HR 67

## 2024-03-11 DIAGNOSIS — R21 Rash and other nonspecific skin eruption: Secondary | ICD-10-CM | POA: Diagnosis not present

## 2024-03-11 NOTE — Patient Instructions (Signed)

## 2024-03-11 NOTE — Progress Notes (Signed)
   New Patient Visit   Subjective  Traci Chandler is a 68 y.o. female NEW PATIENT who presents for the following: rash  Patient states she had rash located at the arms and chest hat she would like to have examined. Patient reports the areas have been there for 2 months. She reports the areas are not bothersome. She states that the areas have not spread. Patient reports she took Zyrtec and it went away.      The following portions of the chart were reviewed this encounter and updated as appropriate: medications, allergies, medical history  Review of Systems:  No other skin or systemic complaints except as noted in HPI or Assessment and Plan.  Objective  Well appearing patient in no apparent distress; mood and affect are within normal limits.    A focused examination was performed of the following areas: Face, arms and chest  Relevant exam findings are noted in the Assessment and Plan.    Assessment & Plan   RASH - RESOLVED   - call if recurs for re-evaluation      RASH AND OTHER NONSPECIFIC SKIN ERUPTION    Return if symptoms worsen or fail to improve.  I, Doyce Pan, CMA, am acting as scribe for Helvi Royals K, PA-C.   Documentation: I have reviewed the above documentation for accuracy and completeness, and I agree with the above.  Sallye Lunz K, PA-C

## 2024-03-18 ENCOUNTER — Encounter: Payer: Self-pay | Admitting: Cardiology

## 2024-03-18 ENCOUNTER — Ambulatory Visit: Attending: Cardiology | Admitting: Cardiology

## 2024-03-18 ENCOUNTER — Other Ambulatory Visit (HOSPITAL_COMMUNITY)
Admission: RE | Admit: 2024-03-18 | Discharge: 2024-03-18 | Disposition: A | Discharge status: Home / Self Care | Source: Ambulatory Visit | Attending: Cardiology | Admitting: Cardiology

## 2024-03-18 VITALS — BP 126/80 | HR 63 | Ht 67.0 in | Wt 235.8 lb

## 2024-03-18 DIAGNOSIS — I2510 Atherosclerotic heart disease of native coronary artery without angina pectoris: Secondary | ICD-10-CM | POA: Diagnosis not present

## 2024-03-18 DIAGNOSIS — E782 Mixed hyperlipidemia: Secondary | ICD-10-CM | POA: Insufficient documentation

## 2024-03-18 DIAGNOSIS — I422 Other hypertrophic cardiomyopathy: Secondary | ICD-10-CM | POA: Diagnosis not present

## 2024-03-18 DIAGNOSIS — I421 Obstructive hypertrophic cardiomyopathy: Secondary | ICD-10-CM | POA: Diagnosis not present

## 2024-03-18 DIAGNOSIS — R931 Abnormal findings on diagnostic imaging of heart and coronary circulation: Secondary | ICD-10-CM | POA: Diagnosis not present

## 2024-03-18 DIAGNOSIS — I10 Essential (primary) hypertension: Secondary | ICD-10-CM | POA: Diagnosis not present

## 2024-03-18 LAB — LIPID PANEL
Cholesterol: 120 mg/dL (ref 0–200)
HDL: 36 mg/dL — ABNORMAL LOW (ref >40)
LDL Cholesterol: 67 mg/dL (ref 0–99)
Total CHOL/HDL Ratio: 3.3 ratio
Triglycerides: 87 mg/dL (ref <150)
VLDL: 17 mg/dL (ref 0–40)

## 2024-03-18 MED ORDER — LOSARTAN POTASSIUM 25 MG PO TABS
25.0000 mg | ORAL_TABLET | Freq: Every day | ORAL | 3 refills | Status: DC
Start: 2024-03-18 — End: 2024-03-18

## 2024-03-18 NOTE — Progress Notes (Signed)
 Cardiology Office Note  Date: 03/18/2024   ID: Traci Chandler, DOB 1956/06/03, MRN [id number]  History of Present Illness: Traci Chandler is a 68 y.o. female last seen in April 2024.  She has had interval follow-up with Dr. Santo, I reviewed his note from November 2024 and interval testing.  She is here for a follow-up visit, states that she is doing very well, NYHA class I dyspnea, no exertional chest pain, no palpitations or syncope.  We went over her medications, she reports compliance with therapy and has tolerated Crestor  5 mg daily.  She does need a follow-up lipid profile.  LDL was 91 last year.  I reviewed her ECG today which shows sinus rhythm with prolonged PR interval, increased voltage.  Physical Exam: VS:  BP 126/80 (BP Location: Left Arm, Cuff Size: Normal)   Pulse 63   Ht 5' 7 (1.702 m)   Wt 235 lb 12.8 oz (107 kg)   SpO2 96%   BMI 36.93 kg/m , BMI Body mass index is 36.93 kg/m.  Wt Readings from Last 3 Encounters:  03/18/24 235 lb 12.8 oz (107 kg)  08/26/23 232 lb (105.2 kg)  06/16/23 242 lb 4.6 oz (109.9 kg)    General: Patient appears comfortable at rest. HEENT: Conjunctiva and lids normal. Neck: Supple, no elevated JVP or carotid bruits. Lungs: Clear to auscultation, nonlabored breathing at rest. Cardiac: Regular rate and rhythm, no S3, 1/6 systolic murmur, no pericardial rub.  ECG:  An ECG dated 06/15/2023 was personally reviewed today and demonstrated:  Sinus rhythm with prolonged PR interval and LVH.  Labwork: 06/17/2023: Magnesium 1.9 08/26/2023: ALT 16; AST 17; BUN 17; Creatinine, Ser 1.06; Hemoglobin 13.2; Platelets 124; Potassium 3.7; Sodium 139  July 2025: Hemoglobin 13.9, platelets 160, BUN 26, creatinine 1.35, GFR 42, potassium 21 January 2024: AST 18, ALT 11  Other Studies Reviewed Today:  Coronary CTA 01/21/2023: IMPRESSION: 1. Coronary calcium  score of 140. This was 73 percentile for age-, sex, and race-matched controls.   2.  Total plaque volume 116 mm3 which is 39 percentile for age- and sex-matched controls (calcified plaque 15 mm3; non-calcified plaque 101 mm3). TPV is (moderate).   3. Normal coronary origin with right dominance.   4. Minimal (0-24) plaque in the LM and LAD.   5. Aortic atherosclerosis.   6. Dilated pulmonary artery (46 mm) suggestive of pulmonary hypertension.   7. Proximal septal thickening (21 mm) c/w known history of HCM.  Cardiac monitor September 24:   Patient had a minimum heart rate of 48 bpm, maximum heart rate of 171 bpm, and average heart rate of 66 bpm.   Predominant underlying rhythm was sinus rhythm.   Rare NSVT, fastest 171 bpm (four beats).   Rare asymptomatic SVT   Isolated PACs were rare (<1.0%).   Isolated PVCs were rare (<1.0%).   No triggered and diary events.   No malignant arrhythmias.  Exercise echocardiogram 03/01/2023:  1. 1.8 cm basal septum c/w HCM.   2. 6.7 METS exercise, no chest pain.   3. LVOT gradient increased from 7 mmHg to 15 mmHg with stress.   4. Mild AI.   5. This is an inconclusive stress echocardiogram for ischemia.   6. This is an indeterminate risk study.   Assessment and Plan:  1.  Hypertrophic nonobstructive cardiomyopathy, septal variant without significant gradient.  She was evaluated by Dr. Santo with testing discussed above, no malignant arrhythmias, no inducible gradient with exercise echocardiogram.  She deferred  genetic testing.  She remains clinically stable with NYHA class I dyspnea.  ECG reviewed and stable.  Did remind her about having her son get an echocardiogram.   2.  Primary hypertension.  Blood pressure is well-controlled today.  Continue nifedipine  60 mg daily, Aldactone  25 mg twice daily and atenolol  50 mg daily.  3.  Coronary calcium  score of 140 with mild coronary atherosclerosis by coronary CTA in August 2024.  Now on Crestor  5 mg daily.  Check follow-up FLP.  Disposition:  Follow up 6  months.  Signed, Jayson JUDITHANN Sierras, M.D., F.A.C.C. [facility]

## 2024-03-18 NOTE — Patient Instructions (Signed)
Medication Instructions:  °Your physician recommends that you continue on your current medications as directed. Please refer to the Current Medication list given to you today. ° ° °Labwork: °Fasting Lipids ° °Testing/Procedures: °None today ° °Follow-Up: °6 months ° °Any Other Special Instructions Will Be Listed Below (If Applicable). ° °If you need a refill on your cardiac medications before your next appointment, please call your pharmacy. ° °

## 2024-03-19 ENCOUNTER — Ambulatory Visit: Payer: Self-pay | Admitting: Cardiology

## 2024-04-07 ENCOUNTER — Other Ambulatory Visit (HOSPITAL_COMMUNITY): Payer: Self-pay | Admitting: Family Medicine

## 2024-04-07 DIAGNOSIS — Z1231 Encounter for screening mammogram for malignant neoplasm of breast: Secondary | ICD-10-CM

## 2024-05-11 ENCOUNTER — Ambulatory Visit (HOSPITAL_COMMUNITY)
Admission: RE | Admit: 2024-05-11 | Discharge: 2024-05-11 | Disposition: A | Discharge status: Home / Self Care | Source: Ambulatory Visit | Attending: Family Medicine | Admitting: Family Medicine

## 2024-05-11 ENCOUNTER — Encounter (HOSPITAL_COMMUNITY): Payer: Self-pay

## 2024-05-11 DIAGNOSIS — Z1231 Encounter for screening mammogram for malignant neoplasm of breast: Secondary | ICD-10-CM | POA: Diagnosis present
